# Patient Record
Sex: Male | Born: 1960 | Race: Black or African American | Hispanic: No | Marital: Single | Smoking: Never smoker
Health system: Southern US, Community
[De-identification: ages and names within clinical notes are randomized; demographics above are authoritative.]

## PROBLEM LIST (undated history)

## (undated) DIAGNOSIS — I739 Peripheral vascular disease, unspecified: Secondary | ICD-10-CM

## (undated) DIAGNOSIS — I10 Essential (primary) hypertension: Secondary | ICD-10-CM

## (undated) DIAGNOSIS — E785 Hyperlipidemia, unspecified: Secondary | ICD-10-CM

## (undated) DIAGNOSIS — T7840XA Allergy, unspecified, initial encounter: Secondary | ICD-10-CM

## (undated) DIAGNOSIS — E7800 Pure hypercholesterolemia, unspecified: Secondary | ICD-10-CM

## (undated) HISTORY — DX: Hyperlipidemia, unspecified: E78.5

## (undated) HISTORY — DX: Peripheral vascular disease, unspecified: I73.9

## (undated) HISTORY — DX: Allergy, unspecified, initial encounter: T78.40XA

---

## 2009-06-15 ENCOUNTER — Emergency Department (HOSPITAL_COMMUNITY): Admission: EM | Admit: 2009-06-15 | Discharge: 2009-06-15 | Payer: Self-pay | Admitting: Emergency Medicine

## 2010-07-05 LAB — URINALYSIS, ROUTINE W REFLEX MICROSCOPIC
Hgb urine dipstick: NEGATIVE
Ketones, ur: NEGATIVE mg/dL
Protein, ur: NEGATIVE mg/dL
Urobilinogen, UA: 0.2 mg/dL (ref 0.0–1.0)

## 2010-07-05 LAB — POCT I-STAT, CHEM 8
Calcium, Ion: 1.17 mmol/L (ref 1.12–1.32)
Creatinine, Ser: 1 mg/dL (ref 0.4–1.5)
Hemoglobin: 17.7 g/dL — ABNORMAL HIGH (ref 13.0–17.0)
Sodium: 138 mEq/L (ref 135–145)
TCO2: 31 mmol/L (ref 0–100)

## 2012-03-30 ENCOUNTER — Emergency Department (HOSPITAL_COMMUNITY)
Admission: EM | Admit: 2012-03-30 | Discharge: 2012-03-30 | Disposition: A | Discharge status: Home / Self Care | Payer: Self-pay | Attending: Emergency Medicine | Admitting: Emergency Medicine

## 2012-03-30 ENCOUNTER — Emergency Department (HOSPITAL_COMMUNITY): Payer: Self-pay

## 2012-03-30 ENCOUNTER — Encounter (HOSPITAL_COMMUNITY): Payer: Self-pay

## 2012-03-30 DIAGNOSIS — I10 Essential (primary) hypertension: Secondary | ICD-10-CM | POA: Insufficient documentation

## 2012-03-30 DIAGNOSIS — R079 Chest pain, unspecified: Secondary | ICD-10-CM | POA: Insufficient documentation

## 2012-03-30 DIAGNOSIS — Z79899 Other long term (current) drug therapy: Secondary | ICD-10-CM | POA: Insufficient documentation

## 2012-03-30 DIAGNOSIS — E7800 Pure hypercholesterolemia, unspecified: Secondary | ICD-10-CM | POA: Insufficient documentation

## 2012-03-30 HISTORY — DX: Essential (primary) hypertension: I10

## 2012-03-30 HISTORY — DX: Pure hypercholesterolemia, unspecified: E78.00

## 2012-03-30 LAB — HEPATIC FUNCTION PANEL
Bilirubin, Direct: 0.2 mg/dL (ref 0.0–0.3)
Indirect Bilirubin: 1 mg/dL — ABNORMAL HIGH (ref 0.3–0.9)
Total Bilirubin: 1.2 mg/dL (ref 0.3–1.2)

## 2012-03-30 LAB — CBC WITH DIFFERENTIAL/PLATELET
Basophils Absolute: 0 10*3/uL (ref 0.0–0.1)
Basophils Relative: 0 % (ref 0–1)
Eosinophils Absolute: 0.1 10*3/uL (ref 0.0–0.7)
Eosinophils Relative: 1 % (ref 0–5)
Lymphs Abs: 2 10*3/uL (ref 0.7–4.0)
MCH: 29.6 pg (ref 26.0–34.0)
Neutrophils Relative %: 52 % (ref 43–77)
Platelets: 221 10*3/uL (ref 150–400)
RBC: 5.64 MIL/uL (ref 4.22–5.81)
RDW: 12.8 % (ref 11.5–15.5)

## 2012-03-30 LAB — POCT I-STAT TROPONIN I
Troponin i, poc: 0 ng/mL (ref 0.00–0.08)
Troponin i, poc: 0 ng/mL (ref 0.00–0.08)

## 2012-03-30 LAB — LIPASE, BLOOD: Lipase: 26 U/L (ref 11–59)

## 2012-03-30 LAB — BASIC METABOLIC PANEL
Calcium: 10.2 mg/dL (ref 8.4–10.5)
GFR calc Af Amer: >90 mL/min (ref >90)
GFR calc non Af Amer: 81 mL/min — ABNORMAL LOW (ref >90)
Glucose, Bld: 92 mg/dL (ref 70–99)
Potassium: 3.4 mEq/L — ABNORMAL LOW (ref 3.5–5.1)
Sodium: 136 mEq/L (ref 135–145)

## 2012-03-30 MED ORDER — GI COCKTAIL ~~LOC~~
30.0000 mL | Freq: Once | ORAL | Status: AC
  Administered 2012-03-30: 30 mL via ORAL
  Filled 2012-03-30: qty 30

## 2012-03-30 MED ORDER — FAMOTIDINE 20 MG PO TABS: 20.0000 mg | ORAL_TABLET | Freq: Two times a day (BID) | ORAL | Status: DC

## 2012-03-30 MED ORDER — ASPIRIN 81 MG PO TABS: 325.0000 mg | ORAL_TABLET | Freq: Once | ORAL | Status: DC

## 2012-03-30 MED ORDER — ASPIRIN 325 MG PO TABS
325.0000 mg | ORAL_TABLET | Freq: Once | ORAL | Status: AC
  Administered 2012-03-30: 325 mg via ORAL
  Filled 2012-03-30: qty 1

## 2012-03-30 NOTE — ED Provider Notes (Signed)
House available for consultation the appropriate patient care phase in the CDU.  Please see my initial note for the patient's placement in the CDU.  Gerhard Munch, MD 03/30/12 [phone]

## 2012-03-30 NOTE — ED Notes (Signed)
Resident MD at bedside.

## 2012-03-30 NOTE — ED Notes (Signed)
Family at bedside. 

## 2012-03-30 NOTE — ED Notes (Signed)
Patient returned from X-ray 

## 2012-03-30 NOTE — ED Provider Notes (Signed)
5:56 PM Assumed care of the patient in the CDU from Dr. Jeraldine Loots.   Patient presented today with a chief complaint of chest pain.  No prior cardiac history.  No acute changes on EKG.  Initial troponin is negative.  Plan is for the patient to have repeat troponin at 8:30 PM and to discharge home if negative.   6:09 PM Reassessed patient.  Patient denies chest pain at this time.  He reports that the GI cocktail helped the pain.   Patient alert and orientated x 3, Heart RRR, Lungs CTAB, Abdomen soft and tender, no lower extremity edema, DP pulse 2 + bilaterally  9:51 PM Repeat 6 hour troponin negative.  Patient continues to deny chest pain.  Patient alert and orientated x 3, Heart RRR, Lungs CTAB, Abdomen soft and tender, no lower extremity edema, DP pulse 2 + bilaterally.  Will discharge patient home with prescription for Pepcid.  Return precautions discussed.   Pascal Lux [place], PA-C 03/30/12 2355

## 2012-03-30 NOTE — ED Notes (Signed)
Pt reports developing chest tightness and sharp pain in left and mid chest region at work this am. States that pain is with coughing and movement. Reports no chest pain in past. Does admit to being under a great amount of stress and also lifting heavy objects at work this am and earlier this week. Pt rates chest pressure/soreness 8/10 at this time. Denies nausea, vomiting, diaphoresis, dizziness, and light headedness. Does report SOB with chest pain.

## 2012-03-30 NOTE — ED Notes (Signed)
Patient is alert and orientedx4.  Patient was explained discharge instructions and he understood them.  Patient is being transported home by his wife, Hubbard Hartshorn.

## 2012-03-30 NOTE — ED Provider Notes (Signed)
History     CSN: [id number]  Arrival date & time 03/30/12  1426   None     Chief Complaint  Patient presents with  . Chest Pain   HPI chief complaint chest pain. Patient arrived by private vehicle. History provided by patient. No language barriers. Information not limited onset 4 hours ago. Location lower sternal upper epigastric area. Symptoms worsened with palpation and improved with laying still. Quality dull. No radiation. Severity mild. Timing constant. Duration several hours. Context the patient states this happened after lifting a 40 pound tray at work. For associated signs and symptoms please refer to the review of systems. No treatments prior to arrival. No recent medical care. Regarding patient's social history please refer to the nurse's notes. I have reviewed patient's past medical past surgical social history as well as medications and allergies. No family history for myocardial infarction.  Past Medical History  Diagnosis Date  . Hypercholesteremia   . Hypertension     History reviewed. No pertinent past surgical history.  No family history on file.  History  Substance Use Topics  . Smoking status: Never Smoker   . Smokeless tobacco: Not on file  . Alcohol Use: No      Review of Systems  Constitutional: Negative for fever and chills.  HENT: Negative for neck pain and neck stiffness.   Eyes: Negative for visual disturbance.  Respiratory: Negative for cough, chest tightness and shortness of breath.   Cardiovascular: Positive for chest pain. Negative for palpitations and leg swelling.  Gastrointestinal: Negative for nausea, vomiting, abdominal pain, diarrhea, constipation, blood in stool and abdominal distention.  Genitourinary: Negative for dysuria, urgency, hematuria and difficulty urinating.  Musculoskeletal: Negative for back pain and gait problem.  Skin: Negative for rash.  Neurological: Negative for dizziness, tremors, seizures, syncope, facial asymmetry,  speech difficulty, weakness, light-headedness, numbness and headaches.  Hematological: Negative for adenopathy. Does not bruise/bleed easily.  Psychiatric/Behavioral: Negative for confusion.    Allergies  Review of patient's allergies indicates no known allergies.  Home Medications   Current Outpatient Rx  Name  Route  Sig  Dispense  Refill  . IBUPROFEN 200 MG PO TABS   Oral   Take 200 mg by mouth every 6 (six) hours as needed. For pain         . LORATADINE 10 MG PO TABS   Oral   Take 10 mg by mouth daily.         . ADULT MULTIVITAMIN W/MINERALS CH   Oral   Take 1 tablet by mouth daily.         Marland Kitchen FISH OIL PO   Oral   Take 1 capsule by mouth daily.         Marland Kitchen SIMVASTATIN 20 MG PO TABS   Oral   Take 20 mg by mouth every evening.         . TRIAMTERENE-HCTZ 37.5-25 MG PO CAPS   Oral   Take 1 capsule by mouth every morning.           BP 132/93  Pulse 86  Temp 98.3 F (36.8 C) (Oral)  Resp 20  Ht 5\' 11"  (1.803 m)  Wt 210 lb (95.255 kg)  BMI 29.29 kg/m2  SpO2 97%  Physical Exam  Constitutional: He is oriented to person, place, and time. He appears well-developed and well-nourished. No distress.  HENT:  Head: Normocephalic.  Eyes: Conjunctivae normal are normal.  Neck: Normal range of motion. Neck supple.  Cardiovascular: Normal  rate, regular rhythm, normal heart sounds and intact distal pulses.   No murmur heard. Pulmonary/Chest: Effort normal and breath sounds normal. No respiratory distress. He has no wheezes. He has no rales. He exhibits tenderness.  Abdominal: Soft. Bowel sounds are normal. He exhibits no distension and no mass. There is tenderness. There is no rebound and no guarding.  Musculoskeletal: Normal range of motion. He exhibits no edema and no tenderness.  Neurological: He is alert and oriented to person, place, and time.  Skin: Skin is warm and dry. He is not diaphoretic.  Psychiatric: He has a normal mood and affect.    ED Course   Procedures (including critical care time)  Labs Reviewed  BASIC METABOLIC PANEL - Abnormal; Notable for the following:    Potassium 3.4 (*)     GFR calc non Af Amer 81 (*)     All other components within normal limits  CBC WITH DIFFERENTIAL  POCT I-STAT TROPONIN I   Dg Chest 2 View  03/30/2012  *RADIOLOGY REPORT*  Clinical Data: Chest pain, cough, history of hypertension  CHEST - 2 VIEW  Comparison: None.  Findings: Normal heart size, mediastinal contours, and pulmonary vascularity. Lungs clear. No pleural effusion or pneumothorax. Bones unremarkable.  IMPRESSION: No acute abnormalities.   Original Report Authenticated By: Ulyses Southward, M.D.      1. Chest pain       MDM  Patient is a very well-appearing 51 year old male with past history of hypertension and hyperlipidemia with a TIMI score of 0 presents with lower sternal pain starting after lifting a 40 pound tray at work. No radiation no pleuritic component to this pain. Extreme reproducible with mild palpation over his lower sternum and upper abdomen. No other concerning exam findings. PERC negative. PE very doubtful. GCS also doubtful given story is much more consistent with a musculoskeletal cause of his chest pain. Initial troponin negative. Plan for a delta troponin 8 hours after symptom onset which will be obtained at 8:30 PM. If this is negative the patient can go home with PCP followup. Transferred to CDU while waiting delta troponin. Patient remained asymptomatic in the ED. Aspirin provided. EKG unremarkable.        Consuello Masse, MD 03/31/12 [phone]

## 2012-03-30 NOTE — ED Notes (Signed)
Pt presents with sudden onset of mid-sternal chest pain while at work today at 1100.  Pt reports pain has been constant and does not radiate.  +shortness of breath.  Pt denies any nausea, reports diaphoresis during onset.

## 2012-04-02 NOTE — ED Provider Notes (Signed)
  I performed a history and physical examination of Jerry Crawford and discussed his management with Dr. March Rummage.  I agree with the history, physical, assessment, and plan of care, with the following exceptions: None  Patient w cp, in nad on my exam.    Thelonious Kauffmann, Elvis Coil, MD 04/02/12 [phone]

## 2012-08-15 ENCOUNTER — Emergency Department (HOSPITAL_COMMUNITY)
Admission: EM | Admit: 2012-08-15 | Discharge: 2012-08-15 | Disposition: A | Discharge status: Home / Self Care | Payer: No Typology Code available for payment source | Attending: Emergency Medicine | Admitting: Emergency Medicine

## 2012-08-15 ENCOUNTER — Encounter (HOSPITAL_COMMUNITY): Payer: Self-pay | Admitting: Emergency Medicine

## 2012-08-15 DIAGNOSIS — Y92410 Unspecified street and highway as the place of occurrence of the external cause: Secondary | ICD-10-CM | POA: Insufficient documentation

## 2012-08-15 DIAGNOSIS — Y9389 Activity, other specified: Secondary | ICD-10-CM | POA: Insufficient documentation

## 2012-08-15 DIAGNOSIS — IMO0002 Reserved for concepts with insufficient information to code with codable children: Secondary | ICD-10-CM | POA: Insufficient documentation

## 2012-08-15 DIAGNOSIS — I10 Essential (primary) hypertension: Secondary | ICD-10-CM | POA: Insufficient documentation

## 2012-08-15 DIAGNOSIS — Z79899 Other long term (current) drug therapy: Secondary | ICD-10-CM | POA: Insufficient documentation

## 2012-08-15 DIAGNOSIS — M546 Pain in thoracic spine: Secondary | ICD-10-CM

## 2012-08-15 DIAGNOSIS — S161XXA Strain of muscle, fascia and tendon at neck level, initial encounter: Secondary | ICD-10-CM

## 2012-08-15 DIAGNOSIS — S139XXA Sprain of joints and ligaments of unspecified parts of neck, initial encounter: Secondary | ICD-10-CM | POA: Insufficient documentation

## 2012-08-15 DIAGNOSIS — E7800 Pure hypercholesterolemia, unspecified: Secondary | ICD-10-CM | POA: Insufficient documentation

## 2012-08-15 MED ORDER — IBUPROFEN 800 MG PO TABS
800.0000 mg | ORAL_TABLET | Freq: Once | ORAL | Status: AC
  Administered 2012-08-15: 800 mg via ORAL
  Filled 2012-08-15: qty 1

## 2012-08-15 MED ORDER — CYCLOBENZAPRINE HCL 10 MG PO TABS
10.0000 mg | ORAL_TABLET | Freq: Once | ORAL | Status: AC
  Administered 2012-08-15: 10 mg via ORAL
  Filled 2012-08-15: qty 1

## 2012-08-15 MED ORDER — IBUPROFEN 800 MG PO TABS: 800.0000 mg | ORAL_TABLET | Freq: Three times a day (TID) | ORAL | Status: DC

## 2012-08-15 MED ORDER — CYCLOBENZAPRINE HCL 10 MG PO TABS: 10.0000 mg | ORAL_TABLET | Freq: Two times a day (BID) | ORAL | Status: DC | PRN
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
## 2012-08-21 NOTE — ED Provider Notes (Signed)
Medical screening examination/treatment/procedure(s) were performed by non-physician practitioner and as supervising physician I was immediately available for consultation/collaboration.  Raeford Razor, MD 08/21/12 [phone]

## 2015-08-10 LAB — CBC AND DIFFERENTIAL
HCT: 48 % (ref 41–53)
HEMOGLOBIN: 16.6 g/dL (ref 13.5–17.5)
Neutrophils Absolute: 36 /uL
Platelets: 236 10*3/uL (ref 150–399)
WBC: 4.2 10*3/mL

## 2015-08-10 LAB — LIPID PANEL
CHOLESTEROL: 130 mg/dL (ref 0–200)
HDL: 42 mg/dL (ref 35–70)
LDL Cholesterol: 71 mg/dL
TRIGLYCERIDES: 84 mg/dL (ref 40–160)

## 2015-08-10 LAB — BASIC METABOLIC PANEL
BUN: 17 mg/dL (ref 4–21)
Creatinine: 1.1 mg/dL (ref <=1.3)
Glucose: 131 mg/dL
POTASSIUM: 4.2 mmol/L (ref 3.4–5.3)
Sodium: 141 mmol/L (ref 137–147)

## 2015-08-10 LAB — HEPATIC FUNCTION PANEL
ALT: 25 U/L (ref 10–40)
AST: 24 U/L (ref 14–40)
Alkaline Phosphatase: 59 U/L (ref 25–125)
Bilirubin, Total: 1.2 mg/dL

## 2016-06-15 ENCOUNTER — Encounter: Payer: Self-pay | Admitting: Family

## 2016-06-15 ENCOUNTER — Other Ambulatory Visit (INDEPENDENT_AMBULATORY_CARE_PROVIDER_SITE_OTHER): Payer: Managed Care, Other (non HMO)

## 2016-06-15 ENCOUNTER — Ambulatory Visit (INDEPENDENT_AMBULATORY_CARE_PROVIDER_SITE_OTHER): Payer: Managed Care, Other (non HMO) | Admitting: Family

## 2016-06-15 VITALS — BP 126/82 | HR 80 | Temp 98.7°F | Resp 16 | Ht 71.0 in | Wt 208.9 lb

## 2016-06-15 DIAGNOSIS — R7301 Impaired fasting glucose: Secondary | ICD-10-CM

## 2016-06-15 DIAGNOSIS — M67432 Ganglion, left wrist: Secondary | ICD-10-CM | POA: Diagnosis not present

## 2016-06-15 DIAGNOSIS — R7989 Other specified abnormal findings of blood chemistry: Secondary | ICD-10-CM

## 2016-06-15 DIAGNOSIS — E291 Testicular hypofunction: Secondary | ICD-10-CM

## 2016-06-15 DIAGNOSIS — Z7289 Other problems related to lifestyle: Secondary | ICD-10-CM

## 2016-06-15 DIAGNOSIS — I83893 Varicose veins of bilateral lower extremities with other complications: Secondary | ICD-10-CM | POA: Insufficient documentation

## 2016-06-15 DIAGNOSIS — Z0001 Encounter for general adult medical examination with abnormal findings: Secondary | ICD-10-CM

## 2016-06-15 DIAGNOSIS — N521 Erectile dysfunction due to diseases classified elsewhere: Secondary | ICD-10-CM

## 2016-06-15 DIAGNOSIS — I8393 Asymptomatic varicose veins of bilateral lower extremities: Secondary | ICD-10-CM | POA: Diagnosis not present

## 2016-06-15 LAB — COMPREHENSIVE METABOLIC PANEL
ALK PHOS: 52 U/L (ref 39–117)
ALT: 22 U/L (ref 0–53)
AST: 21 U/L (ref 0–37)
Albumin: 4.8 g/dL (ref 3.5–5.2)
BILIRUBIN TOTAL: 1.3 mg/dL — AB (ref 0.2–1.2)
BUN: 15 mg/dL (ref 6–23)
CO2: 31 meq/L (ref 19–32)
Calcium: 10.1 mg/dL (ref 8.4–10.5)
Chloride: 99 mEq/L (ref 96–112)
Creatinine, Ser: 1 mg/dL (ref 0.40–1.50)
GFR: 82.88 mL/min (ref >60.00)
GLUCOSE: 105 mg/dL — AB (ref 70–99)
Potassium: 3.4 mEq/L — ABNORMAL LOW (ref 3.5–5.1)
Sodium: 137 mEq/L (ref 135–145)
TOTAL PROTEIN: 7.5 g/dL (ref 6.0–8.3)

## 2016-06-15 LAB — TSH: TSH: 1.26 u[IU]/mL (ref 0.35–4.50)

## 2016-06-15 LAB — LIPID PANEL
CHOL/HDL RATIO: 3
Cholesterol: 113 mg/dL (ref 0–200)
HDL: 41.8 mg/dL (ref >39.00)
LDL Cholesterol: 59 mg/dL (ref 0–99)
NONHDL: 70.92
Triglycerides: 61 mg/dL (ref 0.0–149.0)
VLDL: 12.2 mg/dL (ref 0.0–40.0)

## 2016-06-15 LAB — CBC
HCT: 47 % (ref 39.0–52.0)
Hemoglobin: 16.3 g/dL (ref 13.0–17.0)
MCHC: 34.6 g/dL (ref 30.0–36.0)
MCV: 87.3 fl (ref 78.0–100.0)
Platelets: 232 10*3/uL (ref 150.0–400.0)
RBC: 5.39 Mil/uL (ref 4.22–5.81)
RDW: 13.4 % (ref 11.5–15.5)
WBC: 3.8 10*3/uL — AB (ref 4.0–10.5)

## 2016-06-15 LAB — TESTOSTERONE: TESTOSTERONE: 333.93 ng/dL (ref 300.00–890.00)

## 2016-06-15 LAB — HEMOGLOBIN A1C: HEMOGLOBIN A1C: 6.3 % (ref 4.6–6.5)

## 2016-06-15 LAB — PSA: PSA: 0.71 ng/mL (ref 0.10–4.00)

## 2016-06-15 MED ORDER — SILDENAFIL CITRATE 100 MG PO TABS: 50.0000 mg | ORAL_TABLET | Freq: Every day | ORAL | 0 refills | Status: DC | PRN

## 2016-06-15 NOTE — Assessment & Plan Note (Signed)
1) Anticipatory Guidance: Discussed importance of wearing a seatbelt while driving and not texting while driving; changing batteries in smoke detector at least once annually; wearing suntan lotion when outside; eating a balanced and moderate diet; getting physical activity at least 30 minutes per day.  2) Immunizations / Screenings / Labs:  Declines tetanus and influenza. All other immunizations are up-to-date per recommendations. Obtain hepatitis C antibody for hepatitis C screening. Obtain PSA for prostate cancer screening. Obtain hemoglobin A1c for diabetes screening. Obtain PSA for prostate cancer screening. Due for dental exam encouraged to be completed independently. All other screenings are up-to-date per recommendations. Obtain CBC, TSH, CMET, and lipid profile.    Overall well exam with risk factors for cardiovascular disease including hypertension, overweight and hyperlipidemia. Currently maintained on medication with status to be determined by blood work. Continue other healthy lifestyle behaviors and choices. Follow-up prevention exam in 1 year. Follow-up office visit pending blood work as indicated.

## 2016-06-15 NOTE — Assessment & Plan Note (Signed)
Erectile dysfunction most likely associated with chronic medical conditions and multifactorial in nature. Discussed importance of controlling chronic conditions with medication and lifestyle. Noted medication may also be a contributor being factor. Start Viagra. Proper medication usage and side effects discussed including seek emergency care for a priapism. Follow-up pending trial of medication.

## 2016-06-15 NOTE — Progress Notes (Signed)
Subjective:    Patient ID: Jerry Crawford, male    DOB: 1960/05/23, 56 y.o.   MRN: [id number]  Chief Complaint  Patient presents with  . Establish Care    CPE, fasting    HPI:  Jerry Crawford is a 56 y.o. male who presents today for an annual wellness visit.   1) Health Maintenance -   Diet - Averaging about 1-2 meals per day consisting of a regular diet; Caffeine intake of 2-3 cups daily  Exercise - Job is very physical always active at work; no structured exercise out of work   2) Publishing rights manager / Immunizations:  Dental -- Due for exam  Vision -- Up to date    Health Maintenance  Topic Date Due  . Hepatitis C Screening  04[birth year]  . HIV Screening  11/01/1977  . TETANUS/TDAP  11/01/1981  . COLONOSCOPY  11/01/2012  . INFLUENZA VACCINE  07/09/2016 (Originally 11/10/2015)     There is no immunization history on file for this patient.  3.) Ganglion Cyst - This is a new problem. Associated symptom of a cyst located on his left wrist has been going on for several months. Described as enlarging at times with no significant tenderness. Denies any trauma or injury. No numbness or tingling. Denies any modifying factors or attempted treatments.  4.) Varicose veins - this is a new problem. Associated symptoms of varicose veins located in his bilateral lower extremities hemming going on for several years and are painful at times. Denies cramping or calf pain. Denies any modifying factors or attempted treatments. Overall symptoms appear to be stable and gradually worsening over time. Would like referral to vascular surgery for follow-up.  5.) Low testosterone/erectile dysfunction - this is a new problem. Associated symptom of decreased erectile strength with ability to obtain an erection has been going on for several months. Denies any modifying factors or attempted treatments. No trauma or injury. No urinary symptoms. Previously diagnosed with low testosterone with  blood work showing a testosterone level of 348. Does have fatigue on occasions.  No Known Allergies   No outpatient prescriptions prior to visit.   No facility-administered medications prior to visit.      Past Medical History:  Diagnosis Date  . Hyperlipidemia   . Hypertension      History reviewed. No pertinent surgical history.   Family History  Problem Relation Age of Onset  . Asthma Mother   . Asthma Sister   . Asthma Brother   . Healthy Father      Social History   Social History  . Marital status: Single    Spouse name: N/A  . Number of children: 4  . Years of education: 12   Occupational History  . Glass blower/designer    Social History Main Topics  . Smoking status: Never Smoker  . Smokeless tobacco: Never Used  . Alcohol use Yes     Comment: Rarely <1/month  . Drug use: No  . Sexual activity: Not on file   Other Topics Concern  . Not on file   Social History Narrative   Fun/Hobby: Dancing, basketball, baseball, going out every once in a while       Review of Systems  Constitutional: Denies fever, chills, fatigue, or significant weight gain/loss. HENT: Head: Denies headache or neck pain Ears: Denies changes in hearing, ringing in ears, earache, drainage Nose: Denies discharge, stuffiness, itching, nosebleed, sinus pain Throat: Denies sore throat, hoarseness, dry mouth, sores, thrush Eyes: Denies loss/changes  in vision, pain, redness, blurry/double vision, flashing lights Cardiovascular: Denies chest pain/discomfort, tightness, palpitations, shortness of breath with activity, difficulty lying down, swelling, sudden awakening with shortness of breath Positive for varicose veins Respiratory: Denies shortness of breath, cough, sputum production, wheezing Gastrointestinal: Denies dysphasia, heartburn, change in appetite, nausea, change in bowel habits, rectal bleeding, constipation, diarrhea, yellow skin or eyes Genitourinary: Denies frequency,  urgency, burning/pain, blood in urine, incontinence, change in urinary strength. Positive for erectile dysfunction.  Musculoskeletal: Denies muscle/joint pain, stiffness, back pain, redness or swelling of joints, trauma Positive for cyst of wrist.  Skin: Denies rashes, lumps, itching, dryness, color changes, or hair/nail changes Neurological: Denies dizziness, fainting, seizures, weakness, numbness, tingling, tremor Psychiatric - Denies nervousness, stress, depression or memory loss Endocrine: Denies heat or cold intolerance, sweating, frequent urination, excessive thirst, changes in appetite Hematologic: Denies ease of bruising or bleeding     Objective:     BP 126/82 (BP Location: Left Arm, Patient Position: Sitting, Cuff Size: Large)   Pulse 80   Temp 98.7 F (37.1 C) (Oral)   Resp 16   Ht 5\' 11"  (1.803 m)   Wt 208 lb 14.4 oz (94.8 kg)   SpO2 98%   BMI 29.14 kg/m  Nursing note and vital signs reviewed.  Physical Exam  Constitutional: He is oriented to person, place, and time. He appears well-developed and well-nourished.  HENT:  Head: Normocephalic.  Right Ear: Hearing, tympanic membrane, external ear and ear canal normal.  Left Ear: Hearing, tympanic membrane, external ear and ear canal normal.  Nose: Nose normal.  Mouth/Throat: Uvula is midline, oropharynx is clear and moist and mucous membranes are normal.  Eyes: Conjunctivae and EOM are normal. Pupils are equal, round, and reactive to light.  Neck: Neck supple. No JVD present. No tracheal deviation present. No thyromegaly present.  Cardiovascular: Normal rate, regular rhythm, normal heart sounds and intact distal pulses.   Distal pulses and sensation are intact and appropriate. Multiple varicose veins noted on bilateral lower extremities which are mildly sensitive to the touch. No evidence of infection.  Pulmonary/Chest: Effort normal and breath sounds normal.  Abdominal: Soft. Bowel sounds are normal. He exhibits no  distension and no mass. There is no tenderness. There is no rebound and no guarding.  Musculoskeletal: Normal range of motion. He exhibits no edema or tenderness.  Left wrist - obvious cyst located on the lateral aspect below the anatomical snuffbox. Appears close to the radial artery. Firm to the touch with no tenderness. Cyst is mobile.  Lymphadenopathy:    He has no cervical adenopathy.  Neurological: He is alert and oriented to person, place, and time. He has normal reflexes. No cranial nerve deficit. He exhibits normal muscle tone. Coordination normal.  Skin: Skin is warm and dry.  Psychiatric: He has a normal mood and affect. His behavior is normal. Judgment and thought content normal.       Assessment & Plan:   Problem List Items Addressed This Visit      Cardiovascular and Mediastinum   Varicose veins of both lower extremities    Symptoms and exam consistent with varicose veins located on bilateral lower extremities with mild tenderness to touch on occasion. Refer to vascular surgery per patient request.       Relevant Medications   triamterene-hydrochlorothiazide (DYAZIDE) 37.5-25 MG capsule   simvastatin (ZOCOR) 20 MG tablet   amLODipine (NORVASC) 10 MG tablet   sildenafil (VIAGRA) 100 MG tablet   Other Relevant Orders  Ambulatory referral to Vascular Surgery     Endocrine   Elevated fasting blood sugar    Previously noted to have elevated blood sugars. Does note occasional urinary frequency and increased thirst. Obtain hemoglobin A1c. Continue to monitor pending A1c results.      Relevant Orders   Hemoglobin A1c (Completed)     Musculoskeletal and Integument   Ganglion cyst of wrist, left    Symptoms and exam consistent with ganglion cyst located on left wrist with concern for placement near the radial artery. Refer to orthopedics for drainage and possible removal. Continue to monitor pending referral.      Relevant Orders   Ambulatory referral to Orthopedic  Surgery     Other   Encounter for general adult medical examination with abnormal findings - Primary    1) Anticipatory Guidance: Discussed importance of wearing a seatbelt while driving and not texting while driving; changing batteries in smoke detector at least once annually; wearing suntan lotion when outside; eating a balanced and moderate diet; getting physical activity at least 30 minutes per day.  2) Immunizations / Screenings / Labs:  Declines tetanus and influenza. All other immunizations are up-to-date per recommendations. Obtain hepatitis C antibody for hepatitis C screening. Obtain PSA for prostate cancer screening. Obtain hemoglobin A1c for diabetes screening. Obtain PSA for prostate cancer screening. Due for dental exam encouraged to be completed independently. All other screenings are up-to-date per recommendations. Obtain CBC, TSH, CMET, and lipid profile.    Overall well exam with risk factors for cardiovascular disease including hypertension, overweight and hyperlipidemia. Currently maintained on medication with status to be determined by blood work. Continue other healthy lifestyle behaviors and choices. Follow-up prevention exam in 1 year. Follow-up office visit pending blood work as indicated.      Relevant Orders   CBC (Completed)   Comprehensive metabolic panel (Completed)   Lipid panel (Completed)   PSA (Completed)   TSH (Completed)   Ambulatory referral to Gastroenterology   Decreased testosterone level    Previously noted to have decreased testosterone. Not currently maintained on medication. Obtain testosterone and CBC. Continue to monitor pending blood work results.      Relevant Orders   Testosterone (Completed)   Erectile dysfunction due to diseases classified elsewhere    Erectile dysfunction most likely associated with chronic medical conditions and multifactorial in nature. Discussed importance of controlling chronic conditions with medication and  lifestyle. Noted medication may also be a contributor being factor. Start Viagra. Proper medication usage and side effects discussed including seek emergency care for a priapism. Follow-up pending trial of medication.       Other Visit Diagnoses    Other problems related to lifestyle       Relevant Orders   Hepatitis C antibody       I am having Mr. Woldt start on sildenafil. I am also having him maintain his triamterene-hydrochlorothiazide, simvastatin, and amLODipine.   Meds ordered this encounter  Medications  . triamterene-hydrochlorothiazide (DYAZIDE) 37.5-25 MG capsule    Sig: Take 1 capsule by mouth daily.  . simvastatin (ZOCOR) 20 MG tablet    Sig: Take 20 mg by mouth daily.  Marland Kitchen amLODipine (NORVASC) 10 MG tablet    Sig: Take 10 mg by mouth daily.  . sildenafil (VIAGRA) 100 MG tablet    Sig: Take 0.5-1 tablets (50-100 mg total) by mouth daily as needed for erectile dysfunction.    Dispense:  6 tablet    Refill:  0  Order Specific Question:   Supervising Provider    Answer:   Pricilla Holm A [[id number]]     Follow-up: Return in about 1 month (around 07/16/2016), or if symptoms worsen or fail to improve.   Jerry Po, FNP

## 2016-06-15 NOTE — Assessment & Plan Note (Signed)
Symptoms and exam consistent with varicose veins located on bilateral lower extremities with mild tenderness to touch on occasion. Refer to vascular surgery per patient request.

## 2016-06-15 NOTE — Assessment & Plan Note (Signed)
Previously noted to have decreased testosterone. Not currently maintained on medication. Obtain testosterone and CBC. Continue to monitor pending blood work results.

## 2016-06-15 NOTE — Assessment & Plan Note (Signed)
Symptoms and exam consistent with ganglion cyst located on left wrist with concern for placement near the radial artery. Refer to orthopedics for drainage and possible removal. Continue to monitor pending referral.

## 2016-06-15 NOTE — Patient Instructions (Addendum)
Thank you for choosing [facility].  SUMMARY AND INSTRUCTIONS:  Please continue to take your medications as prescribed.  Start taking the Viagra as needed about 30 minutes before.   You will receive a call to schedule your colonoscopy, and from vascular surgery and orthopedics.  Follow up in 1 month.  Continue to work on nutrition and physical activity.   Medication:  Your prescription(s) have been submitted to your pharmacy or been printed and provided for you. Please take as directed and contact our office if you believe you are having problem(s) with the medication(s) or have any questions.  Labs:  Please stop by the lab on the lower level of the building for your blood work. Your results will be released to [place] (or called to you) after review, usually within 72 hours after test completion. If any changes need to be made, you will be notified at that same time.  1.) The lab is open from 7:30am to 5:30 pm Monday-Friday 2.) No appointment is necessary 3.) Fasting (if needed) is 6-8 hours after food and drink; black coffee and water are okay   Referrals:  Referrals have been made during this visit. You should expect to hear back from our schedulers in about 7-10 days in regards to establishing an appointment with the specialists we discussed.   Follow up:  If your symptoms worsen or fail to improve, please contact our office for further instruction, or in case of emergency go directly to the emergency room at the closest medical facility.    Health Maintenance, Male A healthy lifestyle and preventive care is important for your health and wellness. Ask your health care provider about what schedule of regular examinations is right for you. What should I know about weight and diet?  Eat a Healthy Diet  Eat plenty of vegetables, fruits, whole grains, low-fat dairy products, and lean protein.  Do not eat a lot of foods high in solid fats, added sugars, or  salt. Maintain a Healthy Weight  Regular exercise can help you achieve or maintain a healthy weight. You should:  Do at least 150 minutes of exercise each week. The exercise should increase your heart rate and make you sweat (moderate-intensity exercise).  Do strength-training exercises at least twice a week. Watch Your Levels of Cholesterol and Blood Lipids  Have your blood tested for lipids and cholesterol every 5 years starting at 54 years of age. If you are at high risk for heart disease, you should start having your blood tested when you are 54 years old. You may need to have your cholesterol levels checked more often if:  Your lipid or cholesterol levels are high.  You are older than 54 years of age.  You are at high risk for heart disease. What should I know about cancer screening? Many types of cancers can be detected early and may often be prevented. Lung Cancer  You should be screened every year for lung cancer if:  You are a current smoker who has smoked for at least 30 years.  You are a former smoker who has quit within the past 15 years.  Talk to your health care provider about your screening options, when you should start screening, and how often you should be screened. Colorectal Cancer  Routine colorectal cancer screening usually begins at 54 years of age and should be repeated every 5-10 years until you are 54 years old. You may need to be screened more often if early forms of precancerous  polyps or small growths are found. Your health care provider may recommend screening at an earlier age if you have risk factors for colon cancer.  Your health care provider may recommend using home test kits to check for hidden blood in the stool.  A small camera at the end of a tube can be used to examine your colon (sigmoidoscopy or colonoscopy). This checks for the earliest forms of colorectal cancer. Prostate and Testicular Cancer  Depending on your age and overall health,  your health care provider may do certain tests to screen for prostate and testicular cancer.  Talk to your health care provider about any symptoms or concerns you have about testicular or prostate cancer. Skin Cancer  Check your skin from head to toe regularly.  Tell your health care provider about any new moles or changes in moles, especially if:  There is a change in a mole's size, shape, or color.  You have a mole that is larger than a pencil eraser.  Always use sunscreen. Apply sunscreen liberally and repeat throughout the day.  Protect yourself by wearing long sleeves, pants, a wide-brimmed hat, and sunglasses when outside. What should I know about heart disease, diabetes, and high blood pressure?  If you are 69-17 years of age, have your blood pressure checked every 3-5 years. If you are 50 years of age or older, have your blood pressure checked every year. You should have your blood pressure measured twice-once when you are at a hospital or clinic, and once when you are not at a hospital or clinic. Record the average of the two measurements. To check your blood pressure when you are not at a hospital or clinic, you can use:  An automated blood pressure machine at a pharmacy.  A home blood pressure monitor.  Talk to your health care provider about your target blood pressure.  If you are between 4-22 years old, ask your health care provider if you should take aspirin to prevent heart disease.  Have regular diabetes screenings by checking your fasting blood sugar level.  If you are at a normal weight and have a low risk for diabetes, have this test once every three years after the age of 67.  If you are overweight and have a high risk for diabetes, consider being tested at a younger age or more often.  A one-time screening for abdominal aortic aneurysm (AAA) by ultrasound is recommended for men aged 14-75 years who are current or former smokers. What should I know about  preventing infection? Hepatitis B  If you have a higher risk for hepatitis B, you should be screened for this virus. Talk with your health care provider to find out if you are at risk for hepatitis B infection. Hepatitis C  Blood testing is recommended for:  Everyone born from [birth year] through 1963.  Anyone with known risk factors for hepatitis C. Sexually Transmitted Diseases (STDs)  You should be screened each year for STDs including gonorrhea and chlamydia if:  You are sexually active and are younger than 54 years of age.  You are older than 54 years of age and your health care provider tells you that you are at risk for this type of infection.  Your sexual activity has changed since you were last screened and you are at an increased risk for chlamydia or gonorrhea. Ask your health care provider if you are at risk.  Talk with your health care provider about whether you are at high risk of  being infected with HIV. Your health care provider may recommend a prescription medicine to help prevent HIV infection. What else can I do?  Schedule regular health, dental, and eye exams.  Stay current with your vaccines (immunizations).  Do not use any tobacco products, such as cigarettes, chewing tobacco, and e-cigarettes. If you need help quitting, ask your health care provider.  Limit alcohol intake to no more than 2 drinks per day. One drink equals 12 ounces of beer, 5 ounces of wine, or 1 ounces of hard liquor.  Do not use street drugs.  Do not share needles.  Ask your health care provider for help if you need support or information about quitting drugs.  Tell your health care provider if you often feel depressed.  Tell your health care provider if you have ever been abused or do not feel safe at home. This information is not intended to replace advice given to you by your health care provider. Make sure you discuss any questions you have with your health care provider. Document  Released: 09/24/2007 Document Revised: 11/25/2015 Document Reviewed: 12/30/2014 Elsevier Interactive Patient Education  2017 [employer].

## 2016-06-15 NOTE — Assessment & Plan Note (Signed)
Previously noted to have elevated blood sugars. Does note occasional urinary frequency and increased thirst. Obtain hemoglobin A1c. Continue to monitor pending A1c results.

## 2016-06-16 ENCOUNTER — Other Ambulatory Visit: Payer: Self-pay

## 2016-06-16 DIAGNOSIS — I8393 Asymptomatic varicose veins of bilateral lower extremities: Secondary | ICD-10-CM

## 2016-06-16 LAB — HEPATITIS C ANTIBODY: HCV Ab: NEGATIVE

## 2016-07-01 ENCOUNTER — Ambulatory Visit (INDEPENDENT_AMBULATORY_CARE_PROVIDER_SITE_OTHER): Payer: Self-pay | Admitting: Orthopaedic Surgery

## 2016-07-14 ENCOUNTER — Encounter: Payer: Self-pay | Admitting: Vascular Surgery

## 2016-07-15 ENCOUNTER — Ambulatory Visit: Payer: Managed Care, Other (non HMO) | Admitting: Family

## 2016-07-20 ENCOUNTER — Encounter: Payer: Self-pay | Admitting: Family

## 2016-07-20 ENCOUNTER — Encounter (HOSPITAL_COMMUNITY): Payer: Self-pay | Admitting: Emergency Medicine

## 2016-07-22 ENCOUNTER — Ambulatory Visit: Payer: Managed Care, Other (non HMO) | Admitting: Family

## 2016-07-22 ENCOUNTER — Ambulatory Visit (INDEPENDENT_AMBULATORY_CARE_PROVIDER_SITE_OTHER): Payer: Self-pay | Admitting: Orthopaedic Surgery

## 2016-07-26 ENCOUNTER — Ambulatory Visit (INDEPENDENT_AMBULATORY_CARE_PROVIDER_SITE_OTHER): Payer: Managed Care, Other (non HMO) | Admitting: Vascular Surgery

## 2016-07-26 ENCOUNTER — Encounter: Payer: Self-pay | Admitting: Vascular Surgery

## 2016-07-26 ENCOUNTER — Ambulatory Visit (HOSPITAL_COMMUNITY)
Admission: RE | Admit: 2016-07-26 | Discharge: 2016-07-26 | Disposition: A | Discharge status: Home / Self Care | Payer: Managed Care, Other (non HMO) | Source: Ambulatory Visit | Attending: Vascular Surgery | Admitting: Vascular Surgery

## 2016-07-26 VITALS — BP 125/86 | HR 101 | Temp 98.0°F | Resp 16 | Ht 71.0 in | Wt 204.0 lb

## 2016-07-26 DIAGNOSIS — I83893 Varicose veins of bilateral lower extremities with other complications: Secondary | ICD-10-CM

## 2016-07-26 DIAGNOSIS — I868 Varicose veins of other specified sites: Secondary | ICD-10-CM

## 2016-07-26 DIAGNOSIS — I8393 Asymptomatic varicose veins of bilateral lower extremities: Secondary | ICD-10-CM | POA: Diagnosis not present

## 2016-07-26 DIAGNOSIS — I872 Venous insufficiency (chronic) (peripheral): Secondary | ICD-10-CM | POA: Diagnosis not present

## 2016-07-26 NOTE — Progress Notes (Signed)
Referred by:  [address], [place], [place] [address]  Reason for referral: B leg varicosities   History of Present Illness   Jerry Crawford is a 56 y.o. (1960-06-01) male who presents with chief complaint: enlarging varicose veins.  Patient notes years of varicose veins that have been slowly enlarging.  He associates this with prolong standing necessary for work.  The patient's symptoms include: swelling both legs during the day.  The patient has had no history of DVT,  history of varicose vein, no history of venous stasis ulcers, no history of  Lymphedema and known history of skin changes in lower legs.  There is no family history of venous disorders.  The patient has used compression stockings in the past.   Past Medical History:  Diagnosis Date  . Hypercholesteremia   . Hyperlipidemia   . Hypertension    Past Surgical History: none  Social History   Social History  . Marital status: Single    Spouse name: N/A  . Number of children: 4  . Years of education: 12   Occupational History  . Glass blower/designer    Social History Main Topics  . Smoking status: Never Smoker  . Smokeless tobacco: Never Used  . Alcohol use Yes     Comment: Rarely <1/month  . Drug use: No  . Sexual activity: Not on file   Other Topics Concern  . Not on file   Social History Narrative   ** Merged History Encounter **       Fun/Hobby: Dancing, basketball, baseball, going out every once in a while     Family History  Problem Relation Age of Onset  . Asthma Mother   . Asthma Sister   . Asthma Brother   . Healthy Father   . Hypertension Mother   . Hypertension Sister     Current Outpatient Prescriptions  Medication Sig Dispense Refill  . amLODipine (NORVASC) 10 MG tablet Take 10 mg by mouth daily.    . cyclobenzaprine (FLEXERIL) 10 MG tablet Take 1 tablet (10 mg total) by mouth 2 (two) times daily as needed for muscle spasms. 20 tablet 0  . ibuprofen  (ADVIL,MOTRIN) 800 MG tablet Take 1 tablet (800 mg total) by mouth 3 (three) times daily. 21 tablet 0  . loratadine (CLARITIN) 10 MG tablet Take 10 mg by mouth daily.    . Multiple Vitamin (MULTIVITAMIN WITH MINERALS) TABS Take 1 tablet by mouth daily.    . Omega-3 Fatty Acids (FISH OIL PO) Take 1 capsule by mouth daily.    . sildenafil (VIAGRA) 100 MG tablet Take 0.5-1 tablets (50-100 mg total) by mouth daily as needed for erectile dysfunction. 6 tablet 0  . simvastatin (ZOCOR) 20 MG tablet Take 20 mg by mouth every evening.    . Tetrahydrozoline HCl (EYE DROPS OP) Apply 2 drops to eye as needed (for redness in eyes).    . triamterene-hydrochlorothiazide (DYAZIDE) 37.5-25 MG capsule Take 1 capsule by mouth daily.     No current facility-administered medications for this visit.     No Known Allergies   REVIEW OF SYSTEMS:   Cardiac:  positive for: no symptoms, negative for: Chest pain or chest pressure, Shortness of breath upon exertion and Shortness of breath when lying flat,   Vascular:  positive for: Pain in calf, thigh, or hip brought on by ambulation, leg swelling negative for: Pain in feet at night that wakes you up from your sleep  and Blood clot in your veins  Pulmonary:  positive for: no symptoms,  negative for: Oxygen at home, Productive cough and Wheezing  Neurologic:  positive for: No symptoms, negative for: Sudden weakness in arms or legs, Sudden numbness in arms or legs, Sudden onset of difficulty speaking or slurred speech, Temporary loss of vision in one eye and Problems with dizziness  Gastrointestinal:  positive for: no symptoms, negative for: Blood in stool and Vomited blood  Genitourinary:  positive for: no symptoms, negative for: Burning when urinating and Blood in urine  Psychiatric:  positive for: no symptoms,  negative for: Major depression  Hematologic:  positive for: no symptoms,  negative for: negative for: Bleeding problems and Problems with  blood clotting too easily  Dermatologic:  positive for: no symptoms, negative for: Rashes or ulcers  Constitutional:  positive for: no symptoms, negative for: Fever or chills  Ear/Nose/Throat:  positive for: no symptoms, negative for: Change in hearing, Nose bleeds and Sore throat  Musculoskeletal:  positive for: no symptoms, negative for: Back pain, Joint pain and Muscle pain   Physical Examination    Vitals:   07/26/16 1422  BP: 125/86  Pulse: (!) 101  Resp: 16  Temp: 98 F (36.7 C)  TempSrc: Oral  SpO2: 99%  Weight: 204 lb (92.5 kg)  Height: 5\' 11"  (1.803 m)    Body mass index is 28.45 kg/m.  General Alert, O x 3, WD, NAD  Head [place]/AT,    Ear/Nose/Throat Hearing grossly intact, nares without erythema or drainage, oropharynx without Erythema or Exudate, Mallampati score: 3, Dentition intact  Eyes PERRLA, EOMI,    Neck Supple, mid-line trachea,    Pulmonary Sym exp, good B air movt, CTA B  Cardiac RRR, Nl S1, S2, no Murmurs, No rubs, No S3,S4  Vascular Vessel Right Left  Radial Palpable Palpable  Brachial Palpable Palpable  Carotid Palpable Palpable  Aorta Not palpable due to pannus N/A  Femoral Palpable Palpable  Popliteal Not palpable Not palpable  PT Palpable Palpable  DP Palpable Palpable    Gastrointestinal soft, non-distended, non-tender to palpation, No guarding or rebound, no HSM, no masses, no CVAT B, No palpable prominent aortic pulse,    Musculoskeletal M/S 5/5 throughout  , Extremities without ischemic changes  , Edema present: B, Varicosities present: massive varicosities R>L, Lipodermatosclerosis present: B ankles  Neurologic Cranial nerves 2-12 intact , Pain and light touch intact in extremities , Motor exam as listed above  Psychiatric Judgement intact, Mood & affect appropriate for pt's clinical situation  Dermatologic See M/S exam for extremity exam, No rashes otherwise noted  Lymphatic  Palpable lymph nodes: None    Non-invasive  Vascular Imaging   BLE Venous Insufficiency Duplex (Date: 07/26/2016):   RLE:   No DVT and SVT,   + GSV reflux: 10-15 mm  no SSV reflux,  + deep venous reflux: CFV   LLE:  no DVT and SVT,   + GSV reflux: 1.7-4.0 mm  + SSV reflux: 6.5-15 mm  + deep venous reflux: CFV   Medical Decision Making   Doryce Mcgregory C Johnathan Crawford is a 56 y.o. male who presents with: BLE chronic venous insufficiency (C4 Ep As,d Pr), BLE varicose veins with complications   Based on the patient's history and examination, I recommend: compressive therapy.  I discussed with the patient the use of her 20-30 mm thigh high compression stockings and need for 3 month trial of such.  The patient will follow up in 3 months with  my partners in the [facility] for evaluation for: EVLA R GSV, possible EVLA L GSV and SSV.  Thank you for allowing [place] to participate in this patient's care.   Adele Barthel, MD, FACS [facility] Office: [phone] Pager: [phone]  07/26/2016, 2:56 PM

## 2016-07-29 ENCOUNTER — Encounter: Payer: Self-pay | Admitting: Family

## 2016-07-29 ENCOUNTER — Ambulatory Visit: Payer: Self-pay | Admitting: Family

## 2016-07-29 ENCOUNTER — Other Ambulatory Visit: Payer: Self-pay | Admitting: Family

## 2016-07-29 ENCOUNTER — Ambulatory Visit (INDEPENDENT_AMBULATORY_CARE_PROVIDER_SITE_OTHER): Payer: Managed Care, Other (non HMO) | Admitting: Family

## 2016-07-29 DIAGNOSIS — J302 Other seasonal allergic rhinitis: Secondary | ICD-10-CM | POA: Diagnosis not present

## 2016-07-29 DIAGNOSIS — I10 Essential (primary) hypertension: Secondary | ICD-10-CM | POA: Insufficient documentation

## 2016-07-29 MED ORDER — TRIAMTERENE-HCTZ 37.5-25 MG PO CAPS: 1.0000 | ORAL_CAPSULE | Freq: Every day | ORAL | 2 refills | Status: DC

## 2016-07-29 MED ORDER — AZELASTINE HCL 0.05 % OP SOLN: 1.0000 [drp] | Freq: Two times a day (BID) | OPHTHALMIC | 1 refills | Status: DC

## 2016-07-29 NOTE — Assessment & Plan Note (Signed)
Seasonal allergies remain labile with current medication regimen. Discontinue loratadine. Samples of Xyzal and Allegra provided. Start is a last and as needed for allergic conjunctivitis. Instructed to avoid Sudafed and Sudafed combined products. Consider additional nasal corticosteroid or possible montelukast if symptoms worsen or do not improve.

## 2016-07-29 NOTE — Patient Instructions (Addendum)
Thank you for choosing [facility].  SUMMARY AND INSTRUCTIONS:  Please decrease amlodipine to 5 mg daily and continue to monitor your blood pressure.  Start the azilastin for your eyes.   Continue with antihistamine and alternating Claratin and Claratin-D  Check out the Mediterranean type diet.   Work on physical activity and nutritional changes.   Medication:  Your prescription(s) have been submitted to your pharmacy or been printed and provided for you. Please take as directed and contact our office if you believe you are having problem(s) with the medication(s) or have any questions.  Follow up:  If your symptoms worsen or fail to improve, please contact our office for further instruction, or in case of emergency go directly to the emergency room at the closest medical facility.

## 2016-07-29 NOTE — Progress Notes (Signed)
Subjective:    Patient ID: Jerry Crawford, male    DOB: 1960/06/23, 56 y.o.   MRN: [id number]  Chief Complaint  Patient presents with  . Follow-up    HPI:  Jerry Crawford is a 56 y.o. male who  has a past medical history of Hypercholesteremia; Hyperlipidemia; and Hypertension. and presents today for a follow up office visit.  1.) Hypertension - Currently maintained on amlodipine and triamterene-hydrochlorothiazide. Reports taking the medications as prescribed and denies adverse side effects or hypotensive symptoms. Denies worst headache of life with no new symptoms of end organ damage. Blood pressures at home. Has been following a low-sodium diet.  BP Readings from Last 3 Encounters:  07/29/16 112/68  07/26/16 125/86  06/15/16 126/82   2.) Seasonal allergies - Currently maintained on loratadine. Reports taking medication as prescribed and denies adverse side effects. Continues to experience the associated symptoms of itchy watery eyes that has been refractory to over the counter medications.   No Known Allergies    Outpatient Medications Prior to Visit  Medication Sig Dispense Refill  . amLODipine (NORVASC) 10 MG tablet Take 10 mg by mouth daily.    . cyclobenzaprine (FLEXERIL) 10 MG tablet Take 1 tablet (10 mg total) by mouth 2 (two) times daily as needed for muscle spasms. 20 tablet 0  . ibuprofen (ADVIL,MOTRIN) 800 MG tablet Take 1 tablet (800 mg total) by mouth 3 (three) times daily. 21 tablet 0  . Multiple Vitamin (MULTIVITAMIN WITH MINERALS) TABS Take 1 tablet by mouth daily.    . Omega-3 Fatty Acids (FISH OIL PO) Take 1 capsule by mouth daily.    . sildenafil (VIAGRA) 100 MG tablet Take 0.5-1 tablets (50-100 mg total) by mouth daily as needed for erectile dysfunction. 6 tablet 0  . simvastatin (ZOCOR) 20 MG tablet Take 20 mg by mouth every evening.    . Tetrahydrozoline HCl (EYE DROPS OP) Apply 2 drops to eye as needed (for redness in eyes).    Marland Kitchen loratadine  (CLARITIN) 10 MG tablet Take 10 mg by mouth daily.    Marland Kitchen triamterene-hydrochlorothiazide (DYAZIDE) 37.5-25 MG capsule Take 1 capsule by mouth daily.     No facility-administered medications prior to visit.       No past surgical history on file.    Past Medical History:  Diagnosis Date  . Hypercholesteremia   . Hyperlipidemia   . Hypertension       Review of Systems  Constitutional: Negative for chills and fever.  HENT: Positive for congestion and sneezing.   Eyes: Positive for itching.       Negative for changes in vision  Respiratory: Negative for cough, chest tightness, shortness of breath and wheezing.   Cardiovascular: Negative for chest pain, palpitations and leg swelling.  Neurological: Negative for dizziness, weakness and light-headedness.      Objective:    BP 112/68 (BP Location: Left Arm, Patient Position: Sitting, Cuff Size: Large)   Pulse 86   Temp 98 F (36.7 C) (Oral)   Resp 16   Ht 5\' 11"  (1.803 m)   Wt 209 lb 12.8 oz (95.2 kg)   SpO2 98%   BMI 29.26 kg/m  Nursing note and vital signs reviewed.  Physical Exam  Constitutional: He is oriented to person, place, and time. He appears well-developed and well-nourished. No distress.  HENT:  Right Ear: Hearing, tympanic membrane, external ear and ear canal normal.  Left Ear: Hearing, tympanic membrane, external ear and ear canal normal.  Nose: Nose normal.  Mouth/Throat: Uvula is midline, oropharynx is clear and moist and mucous membranes are normal.  Cardiovascular: Normal rate, regular rhythm, normal heart sounds and intact distal pulses.   Pulmonary/Chest: Effort normal and breath sounds normal.  Neurological: He is alert and oriented to person, place, and time.  Skin: Skin is warm and dry.  Psychiatric: He has a normal mood and affect. His behavior is normal. Judgment and thought content normal.       Assessment & Plan:   Problem List Items Addressed This Visit      Cardiovascular and  Mediastinum   Hypertension    Blood pressure well-controlled and below goal 140/90 with current medication regimen and no adverse side effects. Denies hypotensive symptoms. Decrease amlodipine to 5 mg daily. Continue current dosage of triamterene-hydrochlorothiazide. Denies worse headache of life with no symptoms of end organ damage noted on physical exam. Monitor blood pressure at home and follow sodium diet. Continue to monitor.      Relevant Medications   triamterene-hydrochlorothiazide (DYAZIDE) 37.5-25 MG capsule     Respiratory   Seasonal allergies    Seasonal allergies remain labile with current medication regimen. Discontinue loratadine. Samples of Xyzal and Allegra provided. Start is a last and as needed for allergic conjunctivitis. Instructed to avoid Sudafed and Sudafed combined products. Consider additional nasal corticosteroid or possible montelukast if symptoms worsen or do not improve.      Relevant Medications   azelastine (OPTIVAR) 0.05 % ophthalmic solution       I have discontinued Mr. Gerlean Ren loratadine. I have also changed his triamterene-hydrochlorothiazide. Additionally, I am having him start on azelastine. Lastly, I am having him maintain his simvastatin, multivitamin with minerals, Omega-3 Fatty Acids (FISH OIL PO), Tetrahydrozoline HCl (EYE DROPS OP), cyclobenzaprine, ibuprofen, amLODipine, and sildenafil.   Meds ordered this encounter  Medications  . triamterene-hydrochlorothiazide (DYAZIDE) 37.5-25 MG capsule    Sig: Take 1 each (1 capsule total) by mouth daily.    Dispense:  90 capsule    Refill:  2  . azelastine (OPTIVAR) 0.05 % ophthalmic solution    Sig: Place 1 drop into both eyes 2 (two) times daily.    Dispense:  6 mL    Refill:  1    Order Specific Question:   Supervising Provider    Answer:   Pricilla Holm A [[id number]]     Follow-up: Return in about 3 months (around 10/28/2016), or if symptoms worsen or fail to improve.  Mauricio Po,  FNP

## 2016-07-29 NOTE — Assessment & Plan Note (Signed)
Blood pressure well-controlled and below goal 140/90 with current medication regimen and no adverse side effects. Denies hypotensive symptoms. Decrease amlodipine to 5 mg daily. Continue current dosage of triamterene-hydrochlorothiazide. Denies worse headache of life with no symptoms of end organ damage noted on physical exam. Monitor blood pressure at home and follow sodium diet. Continue to monitor.

## 2016-08-01 ENCOUNTER — Ambulatory Visit: Payer: Self-pay | Admitting: Family

## 2016-08-05 ENCOUNTER — Ambulatory Visit (INDEPENDENT_AMBULATORY_CARE_PROVIDER_SITE_OTHER): Payer: Managed Care, Other (non HMO) | Admitting: Orthopaedic Surgery

## 2016-08-05 ENCOUNTER — Encounter (INDEPENDENT_AMBULATORY_CARE_PROVIDER_SITE_OTHER): Payer: Self-pay | Admitting: Orthopaedic Surgery

## 2016-08-05 DIAGNOSIS — M67432 Ganglion, left wrist: Secondary | ICD-10-CM

## 2016-08-05 MED ORDER — METHYLPREDNISOLONE ACETATE 40 MG/ML IJ SUSP
13.3300 mg | INTRAMUSCULAR | Status: AC | PRN
Start: 2016-08-05 — End: 2016-08-05
  Administered 2016-08-05: 13.33 mg

## 2016-08-05 MED ORDER — BUPIVACAINE HCL 0.5 % IJ SOLN
0.3300 mL | INTRAMUSCULAR | Status: AC | PRN
  Administered 2016-08-05: .33 mL

## 2016-08-05 MED ORDER — LIDOCAINE HCL 1 % IJ SOLN
0.3000 mL | INTRAMUSCULAR | Status: AC | PRN
  Administered 2016-08-05: .3 mL

## 2016-08-05 NOTE — Progress Notes (Signed)
Office Visit Note   Patient: Jerry Crawford           Date of Birth: [birth year]           MRN: [id number] Visit Date: 08/05/2016              Requested by: [address], [place], [place] [address] PCP: Mauricio Po, FNP   Assessment & Plan: Visit Diagnoses: No diagnosis found.  Plan: The cyst was aspirated and injected with cortisone and sterile conditions patient tolerates well short wrist brace was also given. I would recommend wearing this for a couple weeks. Follow-up with me as needed.  Follow-Up Instructions: Return if symptoms worsen or fail to improve.   Orders:  No orders of the defined types were placed in this encounter.  No orders of the defined types were placed in this encounter.     Procedures: Hand/UE Inj Date/Time: 08/05/2016 11:41 AM Performed by: Leandrew Koyanagi Authorized by: Leandrew Koyanagi   Condition: volar carpal ganglion   Site:  L wrist Needle Size:  18 G Approach:  Volar Ultrasound Guidance: No   Medications:  13.33 mg methylPREDNISolone acetate 40 MG/ML; 0.33 mL bupivacaine 0.5 %; 0.3 mL lidocaine 1 %     Clinical Data: No additional findings.   Subjective: Chief Complaint  Patient presents with  . Left Wrist - Pain    Patient 56 year old gentleman who has a left wrist ganglion cyst for years that fluctuates in size. Denies any constitutional symptoms or any injuries. He does do repetitive work with his hands at work.    Review of Systems  Constitutional: Negative.   All other systems reviewed and are negative.    Objective: Vital Signs: There were no vitals taken for this visit.  Physical Exam  Constitutional: He is oriented to person, place, and time. He appears well-developed and well-nourished.  HENT:  Head: Normocephalic and atraumatic.  Eyes: Pupils are equal, round, and reactive to light.  Neck: Neck supple.  Pulmonary/Chest: Effort normal.  Abdominal: Soft.  Musculoskeletal: Normal range  of motion.  Neurological: He is alert and oriented to person, place, and time.  Skin: Skin is warm.  Psychiatric: He has a normal mood and affect. His behavior is normal. Judgment and thought content normal.  Nursing note and vitals reviewed.   Ortho Exam Left wrist exam shows a volar ganglion cyst without any overlying skin changes. He does have a palpable radial pulse on the radial aspect of the cyst. Specialty Comments:  No specialty comments available.  Imaging: No results found.   PMFS History: Patient Active Problem List   Diagnosis Date Noted  . Seasonal allergies 07/29/2016  . Hypertension 07/29/2016  . Encounter for general adult medical examination with abnormal findings 06/15/2016  . Decreased testosterone level 06/15/2016  . Ganglion cyst of wrist, left 06/15/2016  . Elevated fasting blood sugar 06/15/2016  . Varicose veins of both lower extremities with complications 61/60/7371  . Erectile dysfunction due to diseases classified elsewhere 06/15/2016   Past Medical History:  Diagnosis Date  . Hypercholesteremia   . Hyperlipidemia   . Hypertension     Family History  Problem Relation Age of Onset  . Asthma Mother   . Asthma Sister   . Asthma Brother   . Healthy Father   . Hypertension Mother   . Hypertension Sister     No past surgical history on file. Social History   Occupational History  .  Glass blower/designer    Social History Main Topics  . Smoking status: Never Smoker  . Smokeless tobacco: Never Used  . Alcohol use Yes     Comment: Rarely <1/month  . Drug use: No  . Sexual activity: Not on file

## 2016-09-01 ENCOUNTER — Other Ambulatory Visit: Payer: Self-pay | Admitting: Family

## 2016-10-14 ENCOUNTER — Encounter: Payer: Self-pay | Admitting: Vascular Surgery

## 2016-10-20 ENCOUNTER — Other Ambulatory Visit: Payer: Self-pay | Admitting: Family

## 2016-10-25 ENCOUNTER — Ambulatory Visit: Payer: Managed Care, Other (non HMO) | Admitting: Vascular Surgery

## 2016-10-26 ENCOUNTER — Ambulatory Visit (INDEPENDENT_AMBULATORY_CARE_PROVIDER_SITE_OTHER): Payer: Managed Care, Other (non HMO) | Admitting: Vascular Surgery

## 2016-10-26 ENCOUNTER — Encounter: Payer: Self-pay | Admitting: Vascular Surgery

## 2016-10-26 VITALS — BP 134/89 | HR 97 | Temp 97.3°F | Resp 16 | Ht 71.0 in | Wt 210.0 lb

## 2016-10-26 DIAGNOSIS — I868 Varicose veins of other specified sites: Secondary | ICD-10-CM

## 2016-10-26 DIAGNOSIS — I83893 Varicose veins of bilateral lower extremities with other complications: Secondary | ICD-10-CM | POA: Diagnosis not present

## 2016-10-26 NOTE — Progress Notes (Signed)
Subjective:     Patient ID: Jerry Crawford, male   DOB: 22-Dec-1960, 56 y.o.   MRN: [id number]  HPI This 56 year old male returns for continued follow-up regarding his bilateral painful varicosities and swelling. He was evaluated by Dr. Geryl Councilman 3 months ago and is tried long-leg elastic compression stockings 20-30 millimeter gradient as well as elevation and ibuprofen but continues to have aching throbbing and burning discomfort as well as progressive edema as the day wears on. He does work standing for 10 hour shifts. This is affecting his daily living in his ability to work and he would like treatment. He has no history of DVT or thrombophlebitis.  Past Medical History:  Diagnosis Date  . Hypercholesteremia   . Hyperlipidemia   . Hypertension     Social History  Substance Use Topics  . Smoking status: Never Smoker  . Smokeless tobacco: Never Used  . Alcohol use Yes     Comment: Rarely <1/month    Family History  Problem Relation Age of Onset  . Asthma Mother   . Asthma Sister   . Asthma Brother   . Healthy Father   . Hypertension Mother   . Hypertension Sister     No Known Allergies   Current Outpatient Prescriptions:  .  amLODipine (NORVASC) 10 MG tablet, take 1 tablet by mouth once daily, Disp: 90 tablet, Rfl: 1 .  azelastine (OPTIVAR) 0.05 % ophthalmic solution, Place 1 drop into both eyes 2 (two) times daily., Disp: 6 mL, Rfl: 1 .  cyclobenzaprine (FLEXERIL) 10 MG tablet, Take 1 tablet (10 mg total) by mouth 2 (two) times daily as needed for muscle spasms. (Patient not taking: Reported on 08/05/2016), Disp: 20 tablet, Rfl: 0 .  ibuprofen (ADVIL,MOTRIN) 800 MG tablet, Take 1 tablet (800 mg total) by mouth 3 (three) times daily. (Patient not taking: Reported on 08/05/2016), Disp: 21 tablet, Rfl: 0 .  Multiple Vitamin (MULTIVITAMIN WITH MINERALS) TABS, Take 1 tablet by mouth daily., Disp: , Rfl:  .  Omega-3 Fatty Acids (FISH OIL PO), Take 1 capsule by mouth daily., Disp: ,  Rfl:  .  sildenafil (VIAGRA) 100 MG tablet, Take 0.5-1 tablets (50-100 mg total) by mouth daily as needed for erectile dysfunction. (Patient not taking: Reported on 08/05/2016), Disp: 6 tablet, Rfl: 0 .  simvastatin (ZOCOR) 20 MG tablet, take 1 tablet by mouth once daily, Disp: 90 tablet, Rfl: 1 .  Tetrahydrozoline HCl (EYE DROPS OP), Apply 2 drops to eye as needed (for redness in eyes)., Disp: , Rfl:  .  triamterene-hydrochlorothiazide (DYAZIDE) 37.5-25 MG capsule, Take 1 each (1 capsule total) by mouth daily., Disp: 90 capsule, Rfl: 2 .  triamterene-hydrochlorothiazide (MAXZIDE-25) 37.5-25 MG tablet, take 1 tablet by mouth once daily (Patient not taking: Reported on 08/05/2016), Disp: 30 tablet, Rfl: 5  Vitals:   10/26/16 1325  BP: 134/89  Pulse: 97  Resp: 16  Temp: (!) 97.3 F (36.3 C)  SpO2: 99%  Weight: 210 lb (95.3 kg)  Height: 5\' 11"  (1.803 m)    Body mass index is 29.29 kg/m.         Review of Systems Denies chest pain, dyspnea on exertion, PND, orthopnea, hemoptysis, claudication    Objective:   Physical Exam BP 134/89   Pulse 97   Temp (!) 97.3 F (36.3 C)   Resp 16   Ht 5\' 11"  (1.803 m)   Wt 210 lb (95.3 kg)   SpO2 99%   BMI 29.29 kg/m   Gen.  well-developed well-nourished male no apparent distress alert and oriented 3 Lungs no rhonchi or wheezing Both legs with extensive bulging varicosities which are quite large. Left leg begins in the proximal third of the thigh extending medially over the great saphenous system to the medial malleolus with 1+ distal edema no active ulcer Left leg with extensive bulging varicosities medial thigh over great saphenous system extending into the medial calf to the medial malleolus. 1+ edema.  Today I reviewed the reflux venous study performed at the last visit and also performed a bedside SonoSite ultrasound exam for confirmation Patient has #1 gross reflux in the large caliber right great saphenous vein supplying these  painful varicosities in the right leg followed by #2 gross reflux and a very large left great saphenous vein supplying these painful varicosities. The vein on the left extends for relatively straight course over about 10-12 cm and then becomes quite tortuous. #3 the left small saphenous vein is quite large in caliber and has gross reflux throughout     Assessment:     Painful varicosities bilaterally due to gross reflux right great saphenous vein, left great saphenous vein, and left small saphenous vein causing symptoms which are affecting patient's daily living and ability to work and are resistant to conservative measures including long-leg elastic compression stockings 20-30 millimeter gradient, elevation, and ibuprofen-[id number]    Plan:     Patient needs #1 laser ablation left small saphenous vein followed by #2 laser ablation left great saphenous vein plus greater than 20 stab phlebectomy of painful varicosities followed by #3 laser ablation right great saphenous vein with greater than 20 stab phlebectomy of painful varicosities We will proceed with precertification to perform this in the near future and relieve his symptoms and improve his ability to work He understands that it is possible to have incomplete closure of large caliber great saphenous veins which can only be treated over a relatively short distance-left leg great saphenous vein. We will proceed with precertification to perform this in the near future

## 2016-10-28 ENCOUNTER — Ambulatory Visit: Payer: Managed Care, Other (non HMO) | Admitting: Family

## 2016-10-31 ENCOUNTER — Other Ambulatory Visit: Payer: Self-pay | Admitting: *Deleted

## 2016-10-31 DIAGNOSIS — I83893 Varicose veins of bilateral lower extremities with other complications: Secondary | ICD-10-CM

## 2016-11-04 ENCOUNTER — Other Ambulatory Visit (INDEPENDENT_AMBULATORY_CARE_PROVIDER_SITE_OTHER): Payer: Managed Care, Other (non HMO)

## 2016-11-04 ENCOUNTER — Encounter: Payer: Self-pay | Admitting: Family

## 2016-11-04 ENCOUNTER — Ambulatory Visit (INDEPENDENT_AMBULATORY_CARE_PROVIDER_SITE_OTHER): Payer: Managed Care, Other (non HMO) | Admitting: Family

## 2016-11-04 VITALS — BP 122/84 | HR 82 | Resp 16 | Ht 71.0 in | Wt 212.1 lb

## 2016-11-04 DIAGNOSIS — I10 Essential (primary) hypertension: Secondary | ICD-10-CM

## 2016-11-04 LAB — COMPREHENSIVE METABOLIC PANEL
ALBUMIN: 4.7 g/dL (ref 3.5–5.2)
ALT: 24 U/L (ref 0–53)
AST: 22 U/L (ref 0–37)
Alkaline Phosphatase: 48 U/L (ref 39–117)
BUN: 20 mg/dL (ref 6–23)
CALCIUM: 9.8 mg/dL (ref 8.4–10.5)
CO2: 28 mEq/L (ref 19–32)
Chloride: 104 mEq/L (ref 96–112)
Creatinine, Ser: 1.06 mg/dL (ref 0.40–1.50)
GFR: 93.63 mL/min (ref >60.00)
Glucose, Bld: 98 mg/dL (ref 70–99)
POTASSIUM: 3.7 meq/L (ref 3.5–5.1)
SODIUM: 140 meq/L (ref 135–145)
Total Bilirubin: 1.6 mg/dL — ABNORMAL HIGH (ref 0.2–1.2)
Total Protein: 7.4 g/dL (ref 6.0–8.3)

## 2016-11-04 LAB — HEMOGLOBIN A1C: HEMOGLOBIN A1C: 6.5 % (ref 4.6–6.5)

## 2016-11-04 NOTE — Patient Instructions (Signed)
Thank you for choosing [facility].  SUMMARY AND INSTRUCTIONS:  Please continue to take your medications as prescribed.   Monitor your blood pressure at home and follow a low sodium diet.   Medication:  Your prescription(s) have been submitted to your pharmacy or been printed and provided for you. Please take as directed and contact our office if you believe you are having problem(s) with the medication(s) or have any questions.  Labs:  Please stop by the lab on the lower level of the building for your blood work. Your results will be released to [place] (or called to you) after review, usually within 72 hours after test completion. If any changes need to be made, you will be notified at that same time.  1.) The lab is open from 7:30am to 5:30 pm Monday-Friday 2.) No appointment is necessary 3.) Fasting (if needed) is 6-8 hours after food and drink; black coffee and water are okay   Follow up:  If your symptoms worsen or fail to improve, please contact our office for further instruction, or in case of emergency go directly to the emergency room at the closest medical facility.

## 2016-11-04 NOTE — Progress Notes (Signed)
Subjective:    Patient ID: Jerry Crawford, male    DOB: 10-20-1960, 56 y.o.   MRN: [id number]  Chief Complaint  Patient presents with  . Follow-up    BP    HPI:  Jerry Crawford is a 56 y.o. male who  has a past medical history of Hypercholesteremia; Hyperlipidemia; and Hypertension. and presents today for a follow up office visit.  Hypertension - Currently maintained on Triamterene-hydrochlorothiazide and amlodipine and reports taking the medications as prescribed and denies adverse side effects or hypotensive readings. Blood pressures at home have been well controlled. Denies changes in vision, worst headache of life or new symptoms of end organ damage. Working on following a low sodium diet.                                                                                 BP Readings from Last 3 Encounters:  11/04/16 122/84  10/26/16 134/89  07/29/16 112/68     No Known Allergies    Outpatient Medications Prior to Visit  Medication Sig Dispense Refill  . amLODipine (NORVASC) 10 MG tablet take 1 tablet by mouth once daily 90 tablet 1  . azelastine (OPTIVAR) 0.05 % ophthalmic solution Place 1 drop into both eyes 2 (two) times daily. 6 mL 1  . cyclobenzaprine (FLEXERIL) 10 MG tablet Take 1 tablet (10 mg total) by mouth 2 (two) times daily as needed for muscle spasms. 20 tablet 0  . ibuprofen (ADVIL,MOTRIN) 800 MG tablet Take 1 tablet (800 mg total) by mouth 3 (three) times daily. 21 tablet 0  . Multiple Vitamin (MULTIVITAMIN WITH MINERALS) TABS Take 1 tablet by mouth daily.    . Omega-3 Fatty Acids (FISH OIL PO) Take 1 capsule by mouth daily.    . sildenafil (VIAGRA) 100 MG tablet Take 0.5-1 tablets (50-100 mg total) by mouth daily as needed for erectile dysfunction. 6 tablet 0  . simvastatin (ZOCOR) 20 MG tablet take 1 tablet by mouth once daily 90 tablet 1  . Tetrahydrozoline HCl (EYE DROPS OP) Apply 2 drops to eye as needed (for redness in eyes).    .  triamterene-hydrochlorothiazide (DYAZIDE) 37.5-25 MG capsule Take 1 each (1 capsule total) by mouth daily. 90 capsule 2  . triamterene-hydrochlorothiazide (MAXZIDE-25) 37.5-25 MG tablet take 1 tablet by mouth once daily 30 tablet 5   No facility-administered medications prior to visit.      Review of Systems  Constitutional: Negative for chills and fever.  Eyes:       Negative for changes in vision  Respiratory: Negative for cough, chest tightness and wheezing.   Cardiovascular: Negative for chest pain, palpitations and leg swelling.  Neurological: Negative for dizziness, weakness and light-headedness.      Objective:    BP 122/84 (BP Location: Left Arm, Patient Position: Sitting, Cuff Size: Large)   Pulse 82   Resp 16   Ht 5\' 11"  (1.803 m)   Wt 212 lb 1.9 oz (96.2 kg)   SpO2 98%   BMI 29.58 kg/m  Nursing note and vital signs reviewed.  Physical Exam  Constitutional: He is oriented to person, place, and time. He appears well-developed and well-nourished. No  distress.  Cardiovascular: Normal rate, regular rhythm, normal heart sounds and intact distal pulses.   Pulmonary/Chest: Effort normal and breath sounds normal.  Neurological: He is alert and oriented to person, place, and time.  Skin: Skin is warm and dry.  Psychiatric: He has a normal mood and affect. His behavior is normal. Judgment and thought content normal.       Assessment & Plan:   Problem List Items Addressed This Visit      Cardiovascular and Mediastinum   Hypertension - Primary    Blood pressure well-controlled below goal 140/90 with current medication regimen and no adverse side effects. Obtain complete middle profile and hemoglobin A1c. Continue current dosage of triamterene-hydrochlorothiazide and amlodipine. Encouraged monitor blood pressure at home and follow low-sodium diet.      Relevant Orders   Comprehensive metabolic panel   Hemoglobin A1c       I am having Mr. Jerry Crawford maintain his  multivitamin with minerals, Omega-3 Fatty Acids (FISH OIL PO), Tetrahydrozoline HCl (EYE DROPS OP), cyclobenzaprine, ibuprofen, sildenafil, triamterene-hydrochlorothiazide, azelastine, amLODipine, and simvastatin.   Follow-up: Return in about 3 months (around 02/04/2017), or if symptoms worsen or fail to improve.  Mauricio Po, FNP

## 2016-11-04 NOTE — Assessment & Plan Note (Signed)
Blood pressure well-controlled below goal 140/90 with current medication regimen and no adverse side effects. Obtain complete middle profile and hemoglobin A1c. Continue current dosage of triamterene-hydrochlorothiazide and amlodipine. Encouraged monitor blood pressure at home and follow low-sodium diet.

## 2016-11-07 ENCOUNTER — Telehealth: Payer: Self-pay | Admitting: Family

## 2016-11-07 ENCOUNTER — Encounter: Payer: Self-pay | Admitting: Vascular Surgery

## 2016-11-07 NOTE — Telephone Encounter (Signed)
Returned call. LVM for pt to call back for results.

## 2016-11-07 NOTE — Telephone Encounter (Signed)
Patient is requesting a call about his labs. Please follow up once you have a chance. Thank you.

## 2016-11-08 ENCOUNTER — Ambulatory Visit (INDEPENDENT_AMBULATORY_CARE_PROVIDER_SITE_OTHER): Payer: Managed Care, Other (non HMO) | Admitting: Vascular Surgery

## 2016-11-08 ENCOUNTER — Encounter: Payer: Self-pay | Admitting: Vascular Surgery

## 2016-11-08 VITALS — BP 129/78 | HR 93 | Temp 97.1°F | Resp 16 | Ht 71.0 in | Wt 214.0 lb

## 2016-11-08 DIAGNOSIS — I83892 Varicose veins of left lower extremities with other complications: Secondary | ICD-10-CM

## 2016-11-08 HISTORY — PX: ENDOVENOUS ABLATION SAPHENOUS VEIN W/ LASER: SUR449

## 2016-11-08 NOTE — Progress Notes (Signed)
Subjective:     Patient ID: Jerry Crawford, male   DOB: 12/06/1960, 56 y.o.   MRN: [id number]  HPI this 56 year old male had laser ablation of the left small saphenous vein performed under local tumescent anesthesia. A total of 1287 J of energy was utilized. He tolerated the procedure well. Review of Systems     Objective:   Physical Exam BP 129/78 (BP Location: Left Arm, Patient Position: Sitting, Cuff Size: Normal)   Pulse 93   Temp (!) 97.1 F (36.2 C) (Oral)   Resp 16   Ht 5\' 11"  (1.803 m)   Wt 214 lb (97.1 kg)   SpO2 100%   BMI 29.85 kg/m       Assessment:     Well-tolerated laser ablation left small saphenous vein performed under local tumescent anesthesia    Plan:     Return in 1 week for venous duplex exam to confirm closure left small saphenous vein Patient will have closure of left great saphenous vein plus multiple stab phlebectomy performed in the near future

## 2016-11-08 NOTE — Progress Notes (Signed)
Laser Ablation Procedure    Date: 11/08/2016   Jerry Crawford DOB:Aug 20, 1960  Consent signed: Yes    Surgeon:  Dr. Nelda Severe. Kellie Simmering  Procedure: Laser Ablation: left Small Saphenous Vein  BP 129/78 (BP Location: Left Arm, Patient Position: Sitting, Cuff Size: Normal)   Pulse 93   Temp (!) 97.1 F (36.2 C) (Oral)   Resp 16   Ht 5\' 11"  (1.803 m)   Wt 214 lb (97.1 kg)   SpO2 100%   BMI 29.85 kg/m   Tumescent Anesthesia: 325 cc 0.9% NaCl with 50 cc Lidocaine HCL with 1% Epi and 15 cc 8.4% NaHCO3  Local Anesthesia: 2 cc Lidocaine HCL and NaHCO3 (ratio 2:1)  Pulsed Mode: 15 watts, 513ms delay, 1.0 duration  Total Energy: 1287 Joules             Total Pulses: 86               Total Time: 1:26      Patient tolerated procedure well    Description of Procedure:  After marking the course of the secondary varicosities, the patient was placed on the operating table in the prone position, and the left leg was prepped and draped in sterile fashion.   Local anesthetic was administered and under ultrasound guidance the saphenous vein was accessed with a micro needle and guide wire; then the mirco puncture sheath was placed.  A guide wire was inserted saphenopopliteal junction , followed by a 5 french sheath.  The position of the sheath and then the laser fiber below the junction was confirmed using the ultrasound.  Tumescent anesthesia was administered along the course of the saphenous vein using ultrasound guidance. The patient was placed in Trendelenburg position and protective laser glasses were placed on patient and staff, and the laser was fired at 15 watts continuous mode advancing 1-48mm/second for a total of 1287 joules.  Steristrip was applied to IV insertion site.  ABD pads and thigh high compression hose applied to left calf and thigh. Ace wrap applied to left calf and thigh. Blood loss less than 15 cc. The patient ambulated out of the treatment room having tolerated the procedure  well.

## 2016-11-14 ENCOUNTER — Encounter: Payer: Self-pay | Admitting: Vascular Surgery

## 2016-11-15 ENCOUNTER — Encounter: Payer: Self-pay | Admitting: Vascular Surgery

## 2016-11-15 ENCOUNTER — Ambulatory Visit (HOSPITAL_COMMUNITY)
Admission: RE | Admit: 2016-11-15 | Discharge: 2016-11-15 | Disposition: A | Discharge status: Home / Self Care | Payer: Managed Care, Other (non HMO) | Source: Ambulatory Visit | Attending: Vascular Surgery | Admitting: Vascular Surgery

## 2016-11-15 ENCOUNTER — Ambulatory Visit (INDEPENDENT_AMBULATORY_CARE_PROVIDER_SITE_OTHER): Payer: Managed Care, Other (non HMO) | Admitting: Vascular Surgery

## 2016-11-15 ENCOUNTER — Other Ambulatory Visit: Payer: Self-pay | Admitting: *Deleted

## 2016-11-15 VITALS — BP 132/86 | HR 87 | Temp 98.8°F | Resp 20 | Ht 71.0 in | Wt 212.0 lb

## 2016-11-15 DIAGNOSIS — Z9889 Other specified postprocedural states: Secondary | ICD-10-CM | POA: Insufficient documentation

## 2016-11-15 DIAGNOSIS — I83893 Varicose veins of bilateral lower extremities with other complications: Secondary | ICD-10-CM | POA: Insufficient documentation

## 2016-11-15 NOTE — Progress Notes (Signed)
Subjective:     Patient ID: Jerry Crawford, male   DOB: December 30, 1960, 56 y.o.   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 He has had some mild-to-moderate discomfort in the posterior calf which is now improving. He has taken his ibuprofen and worn elastic compression stocking as instructed. He thinks that the swelling distally a slightly less.  Past Medical History:  Diagnosis Date  . Hypercholesteremia   . Hyperlipidemia   . Hypertension     Social History  Substance Use Topics  . Smoking status: Never Smoker  . Smokeless tobacco: Never Used  . Alcohol use Yes     Comment: Rarely <1/month    Family History  Problem Relation Age of Onset  . Asthma Mother   . Asthma Sister   . Asthma Brother   . Healthy Father   . Hypertension Mother   . Hypertension Sister     No Known Allergies   Current Outpatient Prescriptions:  .  amLODipine (NORVASC) 10 MG tablet, take 1 tablet by mouth once daily, Disp: 90 tablet, Rfl: 1 .  azelastine (OPTIVAR) 0.05 % ophthalmic solution, Place 1 drop into both eyes 2 (two) times daily., Disp: 6 mL, Rfl: 1 .  cyclobenzaprine (FLEXERIL) 10 MG tablet, Take 1 tablet (10 mg total) by mouth 2 (two) times daily as needed for muscle spasms., Disp: 20 tablet, Rfl: 0 .  ibuprofen (ADVIL,MOTRIN) 800 MG tablet, Take 1 tablet (800 mg total) by mouth 3 (three) times daily., Disp: 21 tablet, Rfl: 0 .  Multiple Vitamin (MULTIVITAMIN WITH MINERALS) TABS, Take 1 tablet by mouth daily., Disp: , Rfl:  .  Omega-3 Fatty Acids (FISH OIL PO), Take 1 capsule by mouth daily., Disp: , Rfl:  .  sildenafil (VIAGRA) 100 MG tablet, Take 0.5-1 tablets (50-100 mg total) by mouth daily as needed for erectile dysfunction., Disp: 6 tablet, Rfl: 0 .  simvastatin (ZOCOR) 20 MG tablet, take 1 tablet by mouth once daily, Disp:  90 tablet, Rfl: 1 .  Tetrahydrozoline HCl (EYE DROPS OP), Apply 2 drops to eye as needed (for redness in eyes)., Disp: , Rfl:  .  triamterene-hydrochlorothiazide (DYAZIDE) 37.5-25 MG capsule, Take 1 each (1 capsule total) by mouth daily., Disp: 90 capsule, Rfl: 2  Vitals:   11/15/16 1049  BP: 132/86  Pulse: 87  Resp: 20  Temp: 98.8 F (37.1 C)  TempSrc: Oral  SpO2: 100%  Weight: 212 lb (96.2 kg)  Height: 5\' 11"  (1.803 m)    Body mass index is 29.57 kg/m.         Review of Systems Denies chest pain, dyspnea on exertion, PND, orthopnea, hemoptysis, claudication    Objective:   Physical Exam BP 132/86 (BP Location: Left Arm, Patient Position: Sitting, Cuff Size: Normal)   Pulse 87   Temp 98.8 F (37.1 C) (Oral)   Resp 20   Ht 5\' 11"  (1.803 m)   Wt 212 lb (96.2 kg)   SpO2 100%   BMI 29.57 kg/m   Gen. well-developed well-nourished male no apparent distress alert and oriented 3 Lungs no rhonchi or wheezing Left leg with mild tenderness to deep palpation over small saphenous vein. 1+ chronic edema distally. 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 No  active ulcer noted.  Today I ordered a venous duplex exam left leg which I reviewed and interpreted. There is no DVT. There is total closure of the small saphenous vein up to near the saphenous popliteal junction     Assessment:     Successful laser ablation left small saphenous vein for gross reflux with extensive painful varicosities    Plan:     Plan laser ablation left great saphenous vein with greater than 20 stab phlebectomy of painful varicosities in the near future

## 2016-11-16 ENCOUNTER — Encounter: Payer: Self-pay | Admitting: Vascular Surgery

## 2016-11-23 ENCOUNTER — Ambulatory Visit (INDEPENDENT_AMBULATORY_CARE_PROVIDER_SITE_OTHER): Payer: Managed Care, Other (non HMO) | Admitting: Vascular Surgery

## 2016-11-23 ENCOUNTER — Encounter: Payer: Self-pay | Admitting: Vascular Surgery

## 2016-11-23 ENCOUNTER — Telehealth: Payer: Self-pay | Admitting: *Deleted

## 2016-11-23 VITALS — BP 122/78 | HR 78 | Temp 97.2°F | Resp 18 | Ht 71.0 in | Wt 212.0 lb

## 2016-11-23 DIAGNOSIS — I83892 Varicose veins of left lower extremities with other complications: Secondary | ICD-10-CM | POA: Diagnosis not present

## 2016-11-23 DIAGNOSIS — I868 Varicose veins of other specified sites: Secondary | ICD-10-CM

## 2016-11-23 HISTORY — PX: ENDOVENOUS ABLATION SAPHENOUS VEIN W/ LASER: SUR449

## 2016-11-23 NOTE — Progress Notes (Signed)
Subjective:     Patient ID: Jerry Crawford, male   DOB: 04/15/1960, 56 y.o.   MRN: [id number]  HPI This 56 year old male had laser ablation of the left great saphenous vein from the proximal thigh to the saphenofemoral junction plus greater than 20 stab phlebectomy of painful varicosities both performed under local tumescent anesthesia. A total of 733 J of energy was utilized. He tolerated both procedures well.  Review of Systems     Objective:   Physical Exam BP 122/78 (BP Location: Left Arm, Patient Position: Sitting, Cuff Size: Normal)   Pulse 78   Temp (!) 97.2 F (36.2 C) (Oral)   Resp 18   Ht 5\' 11"  (1.803 m)   Wt 212 lb (96.2 kg)   SpO2 100%   BMI 29.57 kg/m        Assessment:     Well-tolerated laser ablation left great saphenous vein plus greater than 20 stab phlebectomy of painful varicosities performed under local tumescent anesthesia    Plan:     Return in 1 week for venous duplex exam to confirm closure left great saphenous vein and this will complete patient's treatment regimen

## 2016-11-23 NOTE — Progress Notes (Signed)
Laser Ablation Procedure    Date: 11/23/2016   Jerry Crawford DOB:[birth year]  Consent signed: Yes    Surgeon:  Dr. Nelda Severe. Kellie Simmering  Procedure: Laser Ablation: left Greater Saphenous Vein  BP 122/78 (BP Location: Left Arm, Patient Position: Sitting, Cuff Size: Normal)   Pulse 78   Temp (!) 97.2 F (36.2 C) (Oral)   Resp 18   Ht 5\' 11"  (1.803 m)   Wt 212 lb (96.2 kg)   SpO2 100%   BMI 29.57 kg/m   Tumescent Anesthesia: 500 cc 0.9% NaCl with 50 cc Lidocaine HCL with 1% Epi and 15 cc 8.4% NaHCO3  Local Anesthesia: 6 cc Lidocaine HCL and NaHCO3 (ratio 2:1)  Pulsed Mode: 15 watts, 562ms delay, 1.0 duration  Total Energy:  733 Joules            Total Pulses:  49              Total Time:  0:49   Stab Phlebectomy: >20 Sites: Thigh and Calf  Patient tolerated procedure well    Description of Procedure:  After marking the course of the secondary varicosities, the patient was placed on the operating table in the supine position, and the left leg was prepped and draped in sterile fashion.   Local anesthetic was administered and under ultrasound guidance the saphenous vein was accessed with a micro needle and guide wire; then the mirco puncture sheath was placed.  A guide wire was inserted saphenofemoral junction , followed by a 5 french sheath.  The position of the sheath and then the laser fiber below the junction was confirmed using the ultrasound.  Tumescent anesthesia was administered along the course of the saphenous vein using ultrasound guidance. The patient was placed in Trendelenburg position and protective laser glasses were placed on patient and staff, and the laser was fired at 15 watts continuous mode advancing 1-1mm/second for a total of 733 joules.   For stab phlebectomies, local anesthetic was administered at the previously marked varicosities, and tumescent anesthesia was administered around the vessels.  Greater than 20 stab wounds were made using the tip of an 11  blade. And using the vein hook, the phlebectomies were performed using a hemostat to avulse the varicosities.  Adequate hemostasis was achieved.     Steri strips were applied to the stab wounds and ABD pads and thigh high compression stockings were applied.  Ace wrap bandages were applied over the phlebectomy sites and at the top of the saphenofemoral junction. Blood loss was less than 15 cc.  The patient ambulated out of the operating room having tolerated the procedure well.

## 2016-11-23 NOTE — Telephone Encounter (Signed)
[name] is s/p EVLA L GSV and stab phlebectomy >20 incisions by Tinnie Gens MD today.  (9:00AM case)  Jerry Crawford states he is "bleeding from one of the incisions on my upper thigh".  Instructed Mr. Kurka to lie down with his left leg elevated above the level of his heart and have his daughter (who is with him) press directly on the area of bleeding for 5-10 minutes or until the bleeding stopped. Then, apply the ABD pad and Ace wrap (that was sent home with him this morning) over the area of bleeding to give extra compression.  Mr. Meuser verbalized understanding.  Repeated instructions to his daughter.   Called to check on Mr. Kloss at 4:30PM and he is resting comfortably. States direct compression stopped bleeding (left upper thigh)  earlier and ABD pad and Ace wrap were applied by his daughter.

## 2016-11-24 ENCOUNTER — Encounter: Payer: Self-pay | Admitting: Vascular Surgery

## 2016-11-25 ENCOUNTER — Telehealth: Payer: Self-pay | Admitting: *Deleted

## 2016-11-25 NOTE — Telephone Encounter (Signed)
Mr. Jerry Crawford is s/p EVLA L GSV and stab phlebectomy >20 left leg on 11-23-2016.  Mr. Jerry Crawford has removed his compression dressing and reports no bleeding or oozing.  States all stab phlebectomy sites are dry and scabbed over except for one phlebectomy site at top of inner left thigh and one phlebectomy site mid-inner left thigh.  Instructed him to shower without water flowing directly on left leg and to wash and dry the left leg very gently.  Mr. Jerry Crawford has protective pad to place over left inner thigh phlebectomy sites noted above and then will apply thigh high compression hose.  Mr. Jerry Crawford is aware of post LA duplex and VV FU with Dr. Kellie Simmering on 11-28-2016.

## 2016-11-28 ENCOUNTER — Ambulatory Visit (HOSPITAL_COMMUNITY)
Admission: RE | Admit: 2016-11-28 | Discharge: 2016-11-28 | Disposition: A | Discharge status: Home / Self Care | Payer: Managed Care, Other (non HMO) | Source: Ambulatory Visit | Attending: Vascular Surgery | Admitting: Vascular Surgery

## 2016-11-28 ENCOUNTER — Encounter: Payer: Self-pay | Admitting: Vascular Surgery

## 2016-11-28 ENCOUNTER — Ambulatory Visit (INDEPENDENT_AMBULATORY_CARE_PROVIDER_SITE_OTHER): Payer: Managed Care, Other (non HMO) | Admitting: Vascular Surgery

## 2016-11-28 VITALS — BP 138/75 | HR 98 | Temp 97.9°F | Resp 18

## 2016-11-28 DIAGNOSIS — I83892 Varicose veins of left lower extremities with other complications: Secondary | ICD-10-CM

## 2016-11-28 DIAGNOSIS — I83893 Varicose veins of bilateral lower extremities with other complications: Secondary | ICD-10-CM | POA: Insufficient documentation

## 2016-11-28 DIAGNOSIS — Z9889 Other specified postprocedural states: Secondary | ICD-10-CM | POA: Diagnosis not present

## 2016-11-28 NOTE — Progress Notes (Signed)
Subjective:     Patient ID: BLANCHE GALLIEN, male   DOB: 07/30/1960, 56 y.o.   MRN: [id number]  HPI This 56 year old male returns 1 week post-laser ablation left great saphenous vein plus greater than 20 stab phlebectomy of painful varicosities. He had the upper third of the great saphenous vein closed because distal to this point it became quite tortuous and the majority of this was removed through multiple stab phlebectomy wounds. He had some moderate discomfort initially but that is resolving. He has not completely return to work yet at the suggestion of his Fredrich Birks.He denies any chest pain, dyspnea on exertion, PND, orthopnea, hemoptysis. He is wearing his elastic compression stocking and taken ibuprofen as instructed.  Review of Systems     Objective:   Physical Exam BP 138/75 (BP Location: Left Arm, Patient Position: Sitting, Cuff Size: Large)   Pulse 98   Temp 97.9 F (36.6 C)   Resp 18   SpO2 100%   Gen. well-developed well-nourished male no apparent distress alert and oriented 3 Lungs no rhonchi or wheezing Left leg with diffuse ecchymosis medial thigh from proximal third to the knee with well healing stab phlebectomy sites. Minimal distal edema noted. 2+ dorsalis pedis pulse palpable. Minimal tenderness to deep palpation.  Today I ordered a venous duplex exam which I reviewed and interpreted. There is no DVT. There is total closure left great saphenous vein in the upper third of the thigh up to near the saphenofemoral junction     Assessment:     Successful laser ablation left great saphenous vein with multiple stab phlebectomy for painful varicosities    Plan:     We'll proceed with similar procedure and contralateral right leg in about 3-4 weeks

## 2016-11-30 ENCOUNTER — Other Ambulatory Visit: Payer: Self-pay | Admitting: *Deleted

## 2016-11-30 DIAGNOSIS — I83893 Varicose veins of bilateral lower extremities with other complications: Secondary | ICD-10-CM

## 2016-12-26 ENCOUNTER — Ambulatory Visit (INDEPENDENT_AMBULATORY_CARE_PROVIDER_SITE_OTHER): Payer: Managed Care, Other (non HMO) | Admitting: Vascular Surgery

## 2016-12-26 ENCOUNTER — Encounter: Payer: Self-pay | Admitting: Vascular Surgery

## 2016-12-26 VITALS — BP 121/78 | HR 86 | Temp 98.7°F | Resp 16 | Ht 71.0 in | Wt 212.0 lb

## 2016-12-26 DIAGNOSIS — I83891 Varicose veins of right lower extremities with other complications: Secondary | ICD-10-CM | POA: Diagnosis not present

## 2016-12-26 HISTORY — PX: ENDOVENOUS ABLATION SAPHENOUS VEIN W/ LASER: SUR449

## 2016-12-26 NOTE — Progress Notes (Signed)
Subjective:     Patient ID: Jerry Crawford, male   DOB: [birth year], 56 y.o.   MRN: [id number]  HPI This 56 year old male had laser ablation of the right great saphenous vein from the proximal calf to near the saphenofemoral junction plus greater than 20 stab phlebectomy of painful varicosities-approximately 35-all performed under local tumescent anesthesia. He tolerated both procedures well. A total of 3362 J of energy was utilized.  Review of Systems     Objective:   Physical Exam BP 121/78 (BP Location: Left Arm, Patient Position: Sitting, Cuff Size: Large)   Pulse 86   Temp 98.7 F (37.1 C) (Oral)   Resp 16   Ht 5\' 11"  (1.803 m)   Wt 212 lb (96.2 kg)   SpO2 100%   BMI 29.57 kg/m        Assessment:     Well-tolerated laser ablation right great saphenous vein plus proximally 35 stab phlebectomy of painful varicosities performed under local tumescent anesthesia    Plan:     Return in 1 week for venous duplex exam to confirm closure right great saphenous vein and this will complete patient's treatment regimen

## 2016-12-26 NOTE — Progress Notes (Signed)
Laser Ablation Procedure    Date: 12/26/2016   Jerry Crawford DOB:03-Mar-1961  Consent signed: Yes    Surgeon:  Dr. Nelda Severe. Kellie Simmering  Procedure: Laser Ablation: right Greater Saphenous Vein  BP 121/78 (BP Location: Left Arm, Patient Position: Sitting, Cuff Size: Large)   Pulse 86   Temp 98.7 F (37.1 C) (Oral)   Resp 16   Ht 5\' 11"  (1.803 m)   Wt 212 lb (96.2 kg)   SpO2 100%   BMI 29.57 kg/m   Tumescent Anesthesia: 550 cc 0.9% NaCl with 50 cc Lidocaine HCL with 1% Epi and 15 cc 8.4% NaHCO3  Local Anesthesia: 8 cc Lidocaine HCL and NaHCO3 (ratio 2:1)  Pulsed Mode: 17 watts, 525ms delay, 1.0 duration  Total Energy: 3362 Joules             Total Pulses:  199              Total Time: 3:18    Stab Phlebectomy: 35 Sites: Thigh and Calf  Patient tolerated procedure well    Description of Procedure:  After marking the course of the secondary varicosities, the patient was placed on the operating table in the supine position, and the right leg was prepped and draped in sterile fashion.   Local anesthetic was administered and under ultrasound guidance the saphenous vein was accessed with a micro needle and guide wire; then the mirco puncture sheath was placed.  A guide wire was inserted saphenofemoral junction , followed by a 5 french sheath.  The position of the sheath and then the laser fiber below the junction was confirmed using the ultrasound.  Tumescent anesthesia was administered along the course of the saphenous vein using ultrasound guidance. The patient was placed in Trendelenburg position and protective laser glasses were placed on patient and staff, and the laser was fired at 17 watts continuous mode advancing 1-67mm/second for a total of 3362 joules.   For stab phlebectomies, local anesthetic was administered at the previously marked varicosities, and tumescent anesthesia was administered around the vessels.  Greater than 20 stab wounds were made using the tip of an 11  blade. And using the vein hook, the phlebectomies were performed using a hemostat to avulse the varicosities.  Adequate hemostasis was achieved.     Steri strips were applied to the stab wounds and ABD pads and thigh high compression stockings were applied.  Ace wrap bandages were applied over the phlebectomy sites and at the top of the saphenofemoral junction. Blood loss was less than 15 cc.  The patient ambulated out of the operating room having tolerated the procedure well.

## 2016-12-27 ENCOUNTER — Encounter: Payer: Self-pay | Admitting: Vascular Surgery

## 2017-01-04 ENCOUNTER — Ambulatory Visit (INDEPENDENT_AMBULATORY_CARE_PROVIDER_SITE_OTHER): Payer: Self-pay | Admitting: Vascular Surgery

## 2017-01-04 ENCOUNTER — Ambulatory Visit (HOSPITAL_COMMUNITY)
Admission: RE | Admit: 2017-01-04 | Discharge: 2017-01-04 | Disposition: A | Discharge status: Home / Self Care | Payer: Managed Care, Other (non HMO) | Source: Ambulatory Visit | Attending: Vascular Surgery | Admitting: Vascular Surgery

## 2017-01-04 ENCOUNTER — Encounter: Payer: Self-pay | Admitting: Vascular Surgery

## 2017-01-04 VITALS — BP 134/79 | HR 87 | Temp 98.5°F | Resp 16 | Ht 71.0 in | Wt 210.0 lb

## 2017-01-04 DIAGNOSIS — I83893 Varicose veins of bilateral lower extremities with other complications: Secondary | ICD-10-CM

## 2017-01-04 DIAGNOSIS — I83891 Varicose veins of right lower extremities with other complications: Secondary | ICD-10-CM

## 2017-01-04 DIAGNOSIS — Z9889 Other specified postprocedural states: Secondary | ICD-10-CM | POA: Insufficient documentation

## 2017-01-04 NOTE — Progress Notes (Signed)
Subjective:     Patient ID: Jerry Crawford, male   DOB: [birth year], 56 y.o.   MRN: [id number]  HPI this 57 year old male returns 1 week post-laser ablation right great saphenous vein plus proximally 35 stab phlebectomy of painful varicosities both performed under local tumescent anesthesia. He states of the right leg feels much better with improvement in the swelling in the bulges already. He has had no significant discomfort other than some in the medial thigh area which has responded to ibuprofen and ice. He is ambulating well. He is still concerned about some of the recurrent bulges in the left medial thigh.  Past Medical History:  Diagnosis Date  . Hypercholesteremia   . Hyperlipidemia   . Hypertension     Social History  Substance Use Topics  . Smoking status: Never Smoker  . Smokeless tobacco: Never Used  . Alcohol use Yes     Comment: Rarely <1/month    Family History  Problem Relation Age of Onset  . Asthma Mother   . Asthma Sister   . Asthma Brother   . Healthy Father   . Hypertension Mother   . Hypertension Sister     No Known Allergies   Current Outpatient Prescriptions:  .  amLODipine (NORVASC) 10 MG tablet, take 1 tablet by mouth once daily, Disp: 90 tablet, Rfl: 1 .  azelastine (OPTIVAR) 0.05 % ophthalmic solution, Place 1 drop into both eyes 2 (two) times daily., Disp: 6 mL, Rfl: 1 .  cyclobenzaprine (FLEXERIL) 10 MG tablet, Take 1 tablet (10 mg total) by mouth 2 (two) times daily as needed for muscle spasms., Disp: 20 tablet, Rfl: 0 .  desonide (DESOWEN) 0.05 % lotion, , Disp: , Rfl: 0 .  ibuprofen (ADVIL,MOTRIN) 800 MG tablet, Take 1 tablet (800 mg total) by mouth 3 (three) times daily., Disp: 21 tablet, Rfl: 0 .  Multiple Vitamin (MULTIVITAMIN WITH MINERALS) TABS, Take 1 tablet by mouth daily., Disp: , Rfl:  .  Omega-3 Fatty Acids (FISH OIL PO), Take 1 capsule by mouth daily., Disp: , Rfl:  .  sildenafil (VIAGRA) 100 MG tablet, Take 0.5-1 tablets (50-100  mg total) by mouth daily as needed for erectile dysfunction., Disp: 6 tablet, Rfl: 0 .  simvastatin (ZOCOR) 20 MG tablet, take 1 tablet by mouth once daily, Disp: 90 tablet, Rfl: 1 .  Tetrahydrozoline HCl (EYE DROPS OP), Apply 2 drops to eye as needed (for redness in eyes)., Disp: , Rfl:  .  triamterene-hydrochlorothiazide (DYAZIDE) 37.5-25 MG capsule, Take 1 each (1 capsule total) by mouth daily., Disp: 90 capsule, Rfl: 2 .  triamterene-hydrochlorothiazide (MAXZIDE-25) 37.5-25 MG tablet, , Disp: , Rfl: 0  Vitals:   01/04/17 0947  BP: 134/79  Pulse: 87  Resp: 16  Temp: 98.5 F (36.9 C)  SpO2: 100%  Weight: 210 lb (95.3 kg)  Height: 5\' 11"  (1.803 m)    Body mass index is 29.29 kg/m.        Review of Systems     Objective:   Physical Exam BP 134/79   Pulse 87   Temp 98.5 F (36.9 C)   Resp 16   Ht 5\' 11"  (1.803 m)   Wt 210 lb (95.3 kg)   SpO2 100%   BMI 29.29 kg/m   Gen. well-developed well-nourished male no apparent distress alert and oriented 3 Right leg with well healing stab phlebectomy sites with Steri-Strips in place. Minimal distal edema noted. Mild tenderness to deep palpation over great saphenous vein in mid  thigh. 3 posterior cells pedis pulse palpable.  Left leg has some varicosities in the medial thigh beginning in the proximal third extending to the distal third over the great saphenous vein. 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     Assessment:     Successful laser ablation bilateral great saphenous veins with multiple stab phlebectomy with some patency of left great saphenous vein from the saphenofemoral junction to the mid thigh-large caliber vein    Plan:     Patient will return in 6 months for repeat venous duplex exam of left leg to see if great saphenous vein has totally thrombosed or  remains patent and to see the status of these residual varicosities in the mid thigh

## 2017-01-05 NOTE — Addendum Note (Signed)
Addended by: Kambrey Hagger A on: 01/05/2017 04:28 PM   Modules accepted: Orders  

## 2017-01-09 ENCOUNTER — Encounter (HOSPITAL_COMMUNITY): Payer: Managed Care, Other (non HMO)

## 2017-01-09 ENCOUNTER — Ambulatory Visit: Payer: Managed Care, Other (non HMO) | Admitting: Vascular Surgery

## 2017-01-18 ENCOUNTER — Other Ambulatory Visit: Payer: Self-pay | Admitting: Family

## 2017-03-07 ENCOUNTER — Other Ambulatory Visit: Payer: Self-pay | Admitting: *Deleted

## 2017-03-07 NOTE — Telephone Encounter (Signed)
Rec'd fax pt requesting refill on her Amlodipine. Last filled 01/20/17. Rx faxed back denied pt need to make appt w/new provider...Johny Chess

## 2017-03-13 ENCOUNTER — Ambulatory Visit: Payer: Managed Care, Other (non HMO) | Admitting: Family Medicine

## 2017-03-13 ENCOUNTER — Encounter: Payer: Self-pay | Admitting: Family Medicine

## 2017-03-13 VITALS — BP 118/64 | HR 85 | Temp 98.2°F | Ht 71.0 in | Wt 208.0 lb

## 2017-03-13 DIAGNOSIS — I10 Essential (primary) hypertension: Secondary | ICD-10-CM

## 2017-03-13 DIAGNOSIS — R7301 Impaired fasting glucose: Secondary | ICD-10-CM

## 2017-03-13 DIAGNOSIS — F524 Premature ejaculation: Secondary | ICD-10-CM | POA: Diagnosis not present

## 2017-03-13 DIAGNOSIS — R7989 Other specified abnormal findings of blood chemistry: Secondary | ICD-10-CM | POA: Diagnosis not present

## 2017-03-13 DIAGNOSIS — I83891 Varicose veins of right lower extremities with other complications: Secondary | ICD-10-CM

## 2017-03-13 MED ORDER — PAROXETINE HCL 10 MG PO TABS: 10.0000 mg | ORAL_TABLET | Freq: Every day | ORAL | 11 refills | Status: DC

## 2017-03-13 MED ORDER — TRIAMTERENE-HCTZ 37.5-25 MG PO TABS: 1.0000 | ORAL_TABLET | Freq: Every day | ORAL | 11 refills | Status: DC

## 2017-03-13 MED ORDER — DESONIDE 0.05 % EX LOTN: TOPICAL_LOTION | Freq: Two times a day (BID) | CUTANEOUS | 11 refills | Status: AC

## 2017-03-13 MED ORDER — SIMVASTATIN 20 MG PO TABS: 20.0000 mg | ORAL_TABLET | Freq: Every day | ORAL | 11 refills | Status: DC

## 2017-03-13 MED ORDER — AMLODIPINE BESYLATE 10 MG PO TABS: 10.0000 mg | ORAL_TABLET | Freq: Every day | ORAL | 11 refills | Status: DC

## 2017-03-13 NOTE — Progress Notes (Signed)
   Subjective:    Patient ID: Jerry Crawford, male    DOB: 07-29-1960, 56 y.o.   MRN: [id number]  HPI 56 yr old male to establish with [place] after transferring from Terri Piedra NP. He is doing well except for one issue. For several years he has had problems with premature ejaculation. He has never had erection difficulties. He was given Viagra to try but this did not help. His BP has been stable at home. He has had several procedures per Dr. 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   Review of Systems  Constitutional: Negative.   Respiratory: Negative.   Cardiovascular: Negative.   Genitourinary: Negative.   Neurological: Negative.   Psychiatric/Behavioral: Negative.        Objective:   Physical Exam  Constitutional: He is oriented to person, place, and time. He appears well-developed and well-nourished.  Neck: No thyromegaly present.  Cardiovascular: Normal rate, regular rhythm, normal heart sounds and intact distal pulses.  Pulmonary/Chest: Effort normal and breath sounds normal.  Lymphadenopathy:    He has no cervical adenopathy.  Neurological: He is alert and oriented to person, place, and time.          Assessment & Plan:  Intro visit for this patient who complains of premature ejaculation. He will try taking Paxil 10 mg daily. Recheck at his well exam next month. At that time we will set him up for his first colonoscopy as well.  Alysia Penna, MD

## 2017-04-21 ENCOUNTER — Encounter: Payer: Self-pay | Admitting: Family Medicine

## 2017-04-21 ENCOUNTER — Encounter: Payer: Self-pay | Admitting: Gastroenterology

## 2017-04-21 ENCOUNTER — Ambulatory Visit (INDEPENDENT_AMBULATORY_CARE_PROVIDER_SITE_OTHER): Payer: Managed Care, Other (non HMO) | Admitting: Family Medicine

## 2017-04-21 VITALS — BP 122/76 | HR 79 | Temp 98.0°F | Ht 71.0 in | Wt 212.0 lb

## 2017-04-21 DIAGNOSIS — Z0001 Encounter for general adult medical examination with abnormal findings: Secondary | ICD-10-CM

## 2017-04-21 DIAGNOSIS — Z0000 Encounter for general adult medical examination without abnormal findings: Secondary | ICD-10-CM

## 2017-04-21 DIAGNOSIS — N529 Male erectile dysfunction, unspecified: Secondary | ICD-10-CM | POA: Diagnosis not present

## 2017-04-21 LAB — CBC WITH DIFFERENTIAL/PLATELET
Basophils Absolute: 0 10*3/uL (ref 0.0–0.1)
Basophils Relative: 0.6 % (ref 0.0–3.0)
Eosinophils Absolute: 0.1 10*3/uL (ref 0.0–0.7)
Eosinophils Relative: 1.7 % (ref 0.0–5.0)
HCT: 49.7 % (ref 39.0–52.0)
HEMOGLOBIN: 16.6 g/dL (ref 13.0–17.0)
Lymphocytes Relative: 47.8 % — ABNORMAL HIGH (ref 12.0–46.0)
Lymphs Abs: 1.8 10*3/uL (ref 0.7–4.0)
MCHC: 33.3 g/dL (ref 30.0–36.0)
MCV: 87.8 fl (ref 78.0–100.0)
MONO ABS: 0.5 10*3/uL (ref 0.1–1.0)
MONOS PCT: 12.2 % — AB (ref 3.0–12.0)
Neutro Abs: 1.4 10*3/uL (ref 1.4–7.7)
Neutrophils Relative %: 37.7 % — ABNORMAL LOW (ref 43.0–77.0)
Platelets: 248 10*3/uL (ref 150.0–400.0)
RBC: 5.67 Mil/uL (ref 4.22–5.81)
RDW: 13.9 % (ref 11.5–15.5)
WBC: 3.8 10*3/uL — AB (ref 4.0–10.5)

## 2017-04-21 LAB — POCT URINALYSIS DIPSTICK
BILIRUBIN UA: NEGATIVE
Blood, UA: NEGATIVE
Glucose, UA: NEGATIVE
KETONES UA: NEGATIVE
Leukocytes, UA: NEGATIVE
Nitrite, UA: NEGATIVE
Protein, UA: NEGATIVE
Spec Grav, UA: 1.015 (ref 1.010–1.025)
UROBILINOGEN UA: 0.2 U/dL
pH, UA: 7 (ref 5.0–8.0)

## 2017-04-21 LAB — LIPID PANEL
Cholesterol: 128 mg/dL (ref 0–200)
HDL: 48.7 mg/dL (ref >39.00)
LDL Cholesterol: 67 mg/dL (ref 0–99)
NonHDL: 78.89
Total CHOL/HDL Ratio: 3
Triglycerides: 57 mg/dL (ref 0.0–149.0)
VLDL: 11.4 mg/dL (ref 0.0–40.0)

## 2017-04-21 LAB — BASIC METABOLIC PANEL
BUN: 19 mg/dL (ref 6–23)
CALCIUM: 9.7 mg/dL (ref 8.4–10.5)
CO2: 33 mEq/L — ABNORMAL HIGH (ref 19–32)
CREATININE: 0.98 mg/dL (ref 0.40–1.50)
Chloride: 98 mEq/L (ref 96–112)
GFR: 102.33 mL/min (ref >60.00)
Glucose, Bld: 94 mg/dL (ref 70–99)
Potassium: 3.7 mEq/L (ref 3.5–5.1)
Sodium: 138 mEq/L (ref 135–145)

## 2017-04-21 LAB — HEPATIC FUNCTION PANEL
ALT: 22 U/L (ref 0–53)
AST: 21 U/L (ref 0–37)
Albumin: 4.8 g/dL (ref 3.5–5.2)
Alkaline Phosphatase: 51 U/L (ref 39–117)
BILIRUBIN TOTAL: 1.6 mg/dL — AB (ref 0.2–1.2)
Bilirubin, Direct: 0.3 mg/dL (ref 0.0–0.3)
Total Protein: 7.2 g/dL (ref 6.0–8.3)

## 2017-04-21 LAB — PSA: PSA: 0.59 ng/mL (ref 0.10–4.00)

## 2017-04-21 LAB — TSH: TSH: 1.35 u[IU]/mL (ref 0.35–4.50)

## 2017-04-21 MED ORDER — TADALAFIL 20 MG PO TABS: 20.0000 mg | ORAL_TABLET | Freq: Every day | ORAL | 11 refills | Status: DC | PRN

## 2017-04-21 NOTE — Progress Notes (Signed)
   Subjective:    Patient ID: Jerry Crawford, male    DOB: 1960/11/06, 57 y.o.   MRN: [id number]  HPI Here for a well exam. His only concern is about erection issues. He has been taking Paxil 10 mg a day for the past few weeks and this has been very effective at slowing down the premature ejaculations. However his erections are not as firm as they used to be.    Review of Systems  Constitutional: Negative.   HENT: Negative.   Eyes: Negative.   Respiratory: Negative.   Cardiovascular: Negative.   Gastrointestinal: Negative.   Genitourinary: Negative.   Musculoskeletal: Negative.   Skin: Negative.   Neurological: Negative.   Psychiatric/Behavioral: Negative.        Objective:   Physical Exam  Constitutional: He is oriented to person, place, and time. He appears well-developed and well-nourished. No distress.  HENT:  Head: Normocephalic and atraumatic.  Right Ear: External ear normal.  Left Ear: External ear normal.  Nose: Nose normal.  Mouth/Throat: Oropharynx is clear and moist. No oropharyngeal exudate.  Eyes: Conjunctivae and EOM are normal. Pupils are equal, round, and reactive to light. Right eye exhibits no discharge. Left eye exhibits no discharge. No scleral icterus.  Neck: Neck supple. No JVD present. No tracheal deviation present. No thyromegaly present.  Cardiovascular: Normal rate, regular rhythm, normal heart sounds and intact distal pulses. Exam reveals no gallop and no friction rub.  No murmur heard. Pulmonary/Chest: Effort normal and breath sounds normal. No respiratory distress. He has no wheezes. He has no rales. He exhibits no tenderness.  Abdominal: Soft. Bowel sounds are normal. He exhibits no distension and no mass. There is no tenderness. There is no rebound and no guarding.  Genitourinary: Rectum normal, prostate normal and penis normal. Rectal exam shows guaiac negative stool. No penile tenderness.  Musculoskeletal: Normal range of motion. He exhibits  no edema or tenderness.  Lymphadenopathy:    He has no cervical adenopathy.  Neurological: He is alert and oriented to person, place, and time. He has normal reflexes. No cranial nerve deficit. He exhibits normal muscle tone. Coordination normal.  Skin: Skin is warm and dry. No rash noted. He is not diaphoretic. No erythema. No pallor.  Psychiatric: He has a normal mood and affect. His behavior is normal. Judgment and thought content normal.          Assessment & Plan:  Well exam. We discussed diet and exercise. Get fasting labs. Set up his first colonoscopy. Try Cialis for the ED.  Alysia Penna, MD

## 2017-04-28 ENCOUNTER — Ambulatory Visit: Payer: Managed Care, Other (non HMO) | Admitting: Family Medicine

## 2017-04-28 ENCOUNTER — Ambulatory Visit (INDEPENDENT_AMBULATORY_CARE_PROVIDER_SITE_OTHER)
Admission: RE | Admit: 2017-04-28 | Discharge: 2017-04-28 | Disposition: A | Discharge status: Home / Self Care | Payer: Managed Care, Other (non HMO) | Source: Ambulatory Visit | Attending: Family Medicine | Admitting: Family Medicine

## 2017-04-28 ENCOUNTER — Encounter: Payer: Self-pay | Admitting: Family Medicine

## 2017-04-28 VITALS — BP 128/78 | Temp 98.2°F | Wt 212.8 lb

## 2017-04-28 DIAGNOSIS — G4452 New daily persistent headache (NDPH): Secondary | ICD-10-CM | POA: Diagnosis not present

## 2017-04-28 DIAGNOSIS — M5440 Lumbago with sciatica, unspecified side: Secondary | ICD-10-CM

## 2017-04-28 LAB — POCT URINALYSIS DIPSTICK
Bilirubin, UA: NEGATIVE
Blood, UA: NEGATIVE
Glucose, UA: NEGATIVE
Ketones, UA: NEGATIVE
LEUKOCYTES UA: NEGATIVE
NITRITE UA: NEGATIVE
PROTEIN UA: NEGATIVE
SPEC GRAV UA: 1.015 (ref 1.010–1.025)
Urobilinogen, UA: 0.2 E.U./dL
pH, UA: 6.5 (ref 5.0–8.0)

## 2017-04-28 NOTE — Addendum Note (Signed)
Addended by: Myriam Forehand on: 04/28/2017 10:57 AM   Modules accepted: Orders

## 2017-04-28 NOTE — Progress Notes (Signed)
   Subjective:    Patient ID: CLAUDIO MONDRY, male    DOB: [birth year], 57 y.o.   MRN: [id number]  HPI Here for 2 concerns that came up about the same time 6 days ago. He describes a dull constant headache on the left side of the head. No light sensitivity. No vision changes. No neurologic deficits. No sinus congestion. Ibuprofen makes it stop but then the pain returns. He also describes a dull intermittent pain in the lower back which he has never had before. No radiation to the legs. No change in BMs or urinations.    Review of Systems  Constitutional: Negative.   HENT: Negative.   Eyes: Negative.   Respiratory: Negative.   Cardiovascular: Negative.   Musculoskeletal: Positive for back pain.  Neurological: Positive for headaches. Negative for dizziness, tremors, seizures, syncope, facial asymmetry, speech difficulty, weakness, light-headedness and numbness.       Objective:   Physical Exam  Constitutional: He appears well-developed and well-nourished.  HENT:  Head: Normocephalic and atraumatic.  Right Ear: External ear normal.  Left Ear: External ear normal.  Nose: Nose normal.  Mouth/Throat: Oropharynx is clear and moist.  Eyes: Conjunctivae and EOM are normal. Pupils are equal, round, and reactive to light.  Cardiovascular: Normal rate, regular rhythm, normal heart sounds and intact distal pulses.  Pulmonary/Chest: Effort normal and breath sounds normal. No respiratory distress. He has no wheezes. He has no rales.  Musculoskeletal:  The lower back is normal on exam, not tender and full ROM          Assessment & Plan:  Headache and low back pain of uncertain etiology. Get a LS spine Xray. Get a head CT. Use Ibuprofen prn.  Alysia Penna, MD

## 2017-05-10 ENCOUNTER — Inpatient Hospital Stay: Admission: RE | Admit: 2017-05-10 | Payer: Managed Care, Other (non HMO) | Source: Ambulatory Visit

## 2017-05-12 ENCOUNTER — Telehealth: Payer: Self-pay

## 2017-05-12 ENCOUNTER — Ambulatory Visit (INDEPENDENT_AMBULATORY_CARE_PROVIDER_SITE_OTHER)
Admission: RE | Admit: 2017-05-12 | Discharge: 2017-05-12 | Disposition: A | Discharge status: Home / Self Care | Payer: Managed Care, Other (non HMO) | Source: Ambulatory Visit | Attending: Family Medicine | Admitting: Family Medicine

## 2017-05-12 ENCOUNTER — Ambulatory Visit (AMBULATORY_SURGERY_CENTER): Payer: Self-pay | Admitting: *Deleted

## 2017-05-12 ENCOUNTER — Other Ambulatory Visit: Payer: Self-pay

## 2017-05-12 VITALS — Ht 71.0 in | Wt 209.0 lb

## 2017-05-12 DIAGNOSIS — Z1211 Encounter for screening for malignant neoplasm of colon: Secondary | ICD-10-CM

## 2017-05-12 DIAGNOSIS — G4452 New daily persistent headache (NDPH): Secondary | ICD-10-CM | POA: Diagnosis not present

## 2017-05-12 MED ORDER — NA SULFATE-K SULFATE-MG SULF 17.5-3.13-1.6 GM/177ML PO SOLN: 1.0000 | Freq: Once | ORAL | 0 refills | Status: AC

## 2017-05-12 NOTE — Progress Notes (Signed)
No egg or soy allergy known to patient - allergic to mayo only  No issues with past sedation with any surgeries  or procedures, no intubation problems  No diet pills per patient No home 02 use per patient  No blood thinners per patient  Pt denies issues with constipation  No A fib or A flutter  EMMI video sent to pt's e mail

## 2017-05-12 NOTE — Telephone Encounter (Signed)
Called pt and this number has been disconnected call does NOT go through. Called Charlyn Minerva (spouse [phone]) listed on pts's DPR and was unable to leave a VM   Copied from [place]. Topic: Inquiry >> May 12, 2017  1:44 PM Arletha Grippe wrote: Reason for CRM: pt is wanting to have call about xray. And ct scan Please call [phone]   >> May 12, 2017  1:56 PM Para Skeans A wrote: This is not the right office.

## 2017-05-15 NOTE — Telephone Encounter (Signed)
Called pt and left a VM to call back on my personal cell phone. For some reason when I call the pt from my work desk phone his phone line has been disconnected. Pt might have our number blocked from his phone and he didn't know it.

## 2017-05-16 ENCOUNTER — Telehealth: Payer: Self-pay

## 2017-05-16 DIAGNOSIS — R7989 Other specified abnormal findings of blood chemistry: Secondary | ICD-10-CM

## 2017-05-16 NOTE — Telephone Encounter (Signed)
Pt is calling back stating he has tried calling [facility] personal phone and can not get through but he is available on his cell phone now. [phone].

## 2017-05-16 NOTE — Telephone Encounter (Signed)
The referral was done  

## 2017-05-16 NOTE — Telephone Encounter (Signed)
Called and spoke with pt and went over his lab results. Pt advised and voiced understanding. Mailed another copy of pt's lab results sent last copy sent was very unclear due to [place] having printing problems that have now been addressed.

## 2017-05-16 NOTE — Telephone Encounter (Signed)
Pt is requesting a referral to see an urologist doctor due to his concerns with his Testerone  Sent to PCP .

## 2017-05-17 NOTE — Telephone Encounter (Signed)
Called pt and left a VM as requested to do so by the pt.

## 2017-05-18 ENCOUNTER — Telehealth: Payer: Self-pay

## 2017-05-18 NOTE — Telephone Encounter (Signed)
This has been addressed pt is aware of his lab results and we mailed a copy of lab results and ct scan to pt as requested.   Copied from [place] [id number]. Topic: Inquiry >> May 12, 2017  1:44 PM Arletha Grippe wrote: Reason for CRM: pt is wanting to have call about xray. And ct scan Please call [phone]   >> May 12, 2017  1:56 PM Para Skeans A wrote: This is not the right office.

## 2017-05-19 ENCOUNTER — Encounter: Payer: Self-pay | Admitting: Gastroenterology

## 2017-06-02 ENCOUNTER — Ambulatory Visit (AMBULATORY_SURGERY_CENTER): Payer: Managed Care, Other (non HMO) | Admitting: Gastroenterology

## 2017-06-02 ENCOUNTER — Encounter: Payer: Self-pay | Admitting: Gastroenterology

## 2017-06-02 ENCOUNTER — Other Ambulatory Visit: Payer: Self-pay

## 2017-06-02 VITALS — BP 130/76 | HR 81 | Temp 98.9°F | Resp 20 | Ht 71.0 in | Wt 209.0 lb

## 2017-06-02 DIAGNOSIS — Z1211 Encounter for screening for malignant neoplasm of colon: Secondary | ICD-10-CM | POA: Diagnosis present

## 2017-06-02 DIAGNOSIS — D122 Benign neoplasm of ascending colon: Secondary | ICD-10-CM | POA: Diagnosis not present

## 2017-06-02 HISTORY — PX: COLONOSCOPY: SHX174

## 2017-06-02 MED ORDER — SODIUM CHLORIDE 0.9 % IV SOLN: 500.0000 mL | Freq: Once | INTRAVENOUS | Status: DC

## 2017-06-02 NOTE — Patient Instructions (Signed)
YOU HAD AN ENDOSCOPIC PROCEDURE TODAY AT THE [facility]:   Refer to the procedure report that was given to you for any specific questions about what was found during the examination.  If the procedure report does not answer your questions, please call your gastroenterologist to clarify.  If you requested that your care partner not be given the details of your procedure findings, then the procedure report has been included in a sealed envelope for you to review at your convenience later.  YOU SHOULD EXPECT: Some feelings of bloating in the abdomen. Passage of more gas than usual.  Walking can help get rid of the air that was put into your GI tract during the procedure and reduce the bloating. If you had a lower endoscopy (such as a colonoscopy or flexible sigmoidoscopy) you may notice spotting of blood in your stool or on the toilet paper. If you underwent a bowel prep for your procedure, you may not have a normal bowel movement for a few days.  Please Note:  You might notice some irritation and congestion in your nose or some drainage.  This is from the oxygen used during your procedure.  There is no need for concern and it should clear up in a day or so.  SYMPTOMS TO REPORT IMMEDIATELY:   Following lower endoscopy (colonoscopy or flexible sigmoidoscopy):  Excessive amounts of blood in the stool  Significant tenderness or worsening of abdominal pains  Swelling of the abdomen that is new, acute  Fever of 100F or higher   For urgent or emergent issues, a gastroenterologist can be reached at any hour by calling [phone].   DIET:  We do recommend a small meal at first, but then you may proceed to your regular diet.  Drink plenty of fluids but you should avoid alcoholic beverages for 24 hours.  ACTIVITY:  You should plan to take it easy for the rest of today and you should NOT DRIVE or use heavy machinery until tomorrow (because of the sedation medicines used during the test).     FOLLOW UP: Our staff will call the number listed on your records the next business day following your procedure to check on you and address any questions or concerns that you may have regarding the information given to you following your procedure. If we do not reach you, we will leave a message.  However, if you are feeling well and you are not experiencing any problems, there is no need to return our call.  We will assume that you have returned to your regular daily activities without incident.  If any biopsies were taken you will be contacted by phone or by letter within the next 1-3 weeks.  Please call us at [phone] if you have not heard about the biopsies in 3 weeks.    SIGNATURES/CONFIDENTIALITY: You and/or your care partner have signed paperwork which will be entered into your electronic medical record.  These signatures attest to the fact that that the information above on your After Visit Summary has been reviewed and is understood.  Full responsibility of the confidentiality of this discharge information lies with you and/or your care-partner.  Read all handouts given to you by your recovery room nurse. 

## 2017-06-02 NOTE — Progress Notes (Signed)
Called to room to assist during endoscopic procedure.  Patient ID and intended procedure confirmed with present staff. Received instructions for my participation in the procedure from the performing physician.  

## 2017-06-02 NOTE — Progress Notes (Signed)
Pt's states no medical or surgical changes since previsit or office visit. 

## 2017-06-02 NOTE — Op Note (Signed)
[facility] Patient Name: Jerry Crawford Procedure Date: 06/02/2017 7:54 AM MRN: [id number] Endoscopist: Ladene Artist , MD Age: 57 Referring MD:  Date of Birth: 1961-03-18 Gender: Male Account #: [id number] Procedure:                Colonoscopy Indications:              Screening for colorectal malignant neoplasm Medicines:                Monitored Anesthesia Care Procedure:                Pre-Anesthesia Assessment:                           - Prior to the procedure, a History and Physical                            was performed, and patient medications and                            allergies were reviewed. The patient's tolerance of                            previous anesthesia was also reviewed. The risks                            and benefits of the procedure and the sedation                            options and risks were discussed with the patient.                            All questions were answered, and informed consent                            was obtained. Prior Anticoagulants: The patient has                            taken no previous anticoagulant or antiplatelet                            agents. ASA Grade Assessment: II - A patient with                            mild systemic disease. After reviewing the risks                            and benefits, the patient was deemed in                            satisfactory condition to undergo the procedure.                           After obtaining informed consent, the colonoscope  was passed under direct vision. Throughout the                            procedure, the patient's blood pressure, pulse, and                            oxygen saturations were monitored continuously. The                            Colonoscope was introduced through the anus and                            advanced to the the cecum, identified by                            appendiceal orifice and  ileocecal valve. The                            ileocecal valve, appendiceal orifice, and rectum                            were photographed. The quality of the bowel                            preparation was excellent. The colonoscopy was                            performed without difficulty. The patient tolerated                            the procedure well. Scope In: 8:06:21 AM Scope Out: 8:23:00 AM Scope Withdrawal Time: 0 hours 11 minutes 0 seconds  Total Procedure Duration: 0 hours 16 minutes 39 seconds  Findings:                 The perianal and digital rectal examinations were                            normal.                           A 5 mm polyp was found in the ascending colon. The                            polyp was sessile. The polyp was removed with a                            cold biopsy forceps. Resection and retrieval were                            complete.                           Internal hemorrhoids were found during  retroflexion. The hemorrhoids were medium-sized and                            Grade I (internal hemorrhoids that do not prolapse).                           The exam was otherwise without abnormality on                            direct and retroflexion views. Complications:            No immediate complications. Estimated blood loss:                            None. Estimated Blood Loss:     Estimated blood loss: none. Impression:               - One 5 mm polyp in the ascending colon, removed                            with a cold biopsy forceps. Resected and retrieved.                           - Internal hemorrhoids.                           - The examination was otherwise normal on direct                            and retroflexion views. Recommendation:           - Repeat colonoscopy in 5 years for surveillance if                            polyp is precancerous, otherwise 10 years for                             screening.                           - Patient has a contact number available for                            emergencies. The signs and symptoms of potential                            delayed complications were discussed with the                            patient. Return to normal activities tomorrow.                            Written discharge instructions were provided to the                            patient.                           -  Resume previous diet.                           - Continue present medications.                           - Await pathology results. Ladene Artist, MD 06/02/2017 8:26:13 AM This report has been signed electronically.

## 2017-06-02 NOTE — Progress Notes (Signed)
A and O x3. Report to RN. Tolerated MAC anesthesia well.

## 2017-06-05 ENCOUNTER — Telehealth: Payer: Self-pay

## 2017-06-05 NOTE — Telephone Encounter (Signed)
  Follow up Call-  Call back number 06/02/2017  Post procedure Call Back phone  # [phone]  Permission to leave phone message Yes  Some recent data might be hidden     Patient questions:  Do you have a fever, pain , or abdominal swelling? No. Pain Score  0 *  Have you tolerated food without any problems? Yes.    Have you been able to return to your normal activities? Yes.    Do you have any questions about your discharge instructions: Diet   No. Medications  No. Follow up visit  No.  Do you have questions or concerns about your Care? No.  Actions: * If pain score is 4 or above: No action needed, pain <4.  No problems noted pt.  Pt asked about setting up hemorroid banding appointment.  Pt was given tel number to the 3rd floor. maw

## 2017-06-10 ENCOUNTER — Encounter: Payer: Self-pay | Admitting: Gastroenterology

## 2017-06-21 ENCOUNTER — Telehealth: Payer: Self-pay | Admitting: Family Medicine

## 2017-06-21 NOTE — Telephone Encounter (Signed)
Jerry Crawford was last tested here in 06/22/2016 fax [facility] Jerry's sent lab results plus Crawford results

## 2017-06-21 NOTE — Telephone Encounter (Signed)
Copied from [place] [id number] [id number]. Topic: Referral - Question >> Jun 21, 2017  9:17 AM Hewitt Shorts wrote: Reason for CRM: holly with [facility] did not get the patients testoterone levels and tried to find in epic but they cant see everything available   [phone] ext [phone] for [facility] and fax number [phone]

## 2017-07-04 ENCOUNTER — Encounter (HOSPITAL_COMMUNITY): Payer: Managed Care, Other (non HMO)

## 2017-07-04 ENCOUNTER — Ambulatory Visit: Payer: Managed Care, Other (non HMO) | Admitting: Vascular Surgery

## 2017-07-25 ENCOUNTER — Encounter: Payer: Self-pay | Admitting: Vascular Surgery

## 2017-07-25 ENCOUNTER — Ambulatory Visit: Payer: Managed Care, Other (non HMO) | Admitting: Vascular Surgery

## 2017-07-25 ENCOUNTER — Ambulatory Visit (HOSPITAL_COMMUNITY)
Admission: RE | Admit: 2017-07-25 | Discharge: 2017-07-25 | Disposition: A | Discharge status: Home / Self Care | Payer: Managed Care, Other (non HMO) | Source: Ambulatory Visit | Attending: Vascular Surgery | Admitting: Vascular Surgery

## 2017-07-25 VITALS — BP 128/84 | HR 84 | Temp 97.2°F | Resp 18 | Ht 71.0 in | Wt 210.0 lb

## 2017-07-25 DIAGNOSIS — I83893 Varicose veins of bilateral lower extremities with other complications: Secondary | ICD-10-CM | POA: Diagnosis not present

## 2017-07-25 DIAGNOSIS — I872 Venous insufficiency (chronic) (peripheral): Secondary | ICD-10-CM | POA: Insufficient documentation

## 2017-07-25 DIAGNOSIS — I83891 Varicose veins of right lower extremities with other complications: Secondary | ICD-10-CM

## 2017-07-25 NOTE — Progress Notes (Signed)
Subjective:     Patient ID: Jerry Crawford, male   DOB: 1960/06/10, 57 y.o.   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  He has previously had bilateral laser ablation procedures of the great saphenous veins.  The right great saphenous vein has been shown to be essentially occluded to the distal thigh.  The left great saphenous vein recanalized proximally but occluded distally.  He has developed some large bulging varicosities beginning in the upper third of the left medial thigh extending down to the knee.  These are causing heavy aching throbbing discomfort throughout the day particularly when he is on his feet most of the day.  He is not wearing elastic compression stockings at this point.  He has developed some smaller varicosities in the right distal medial thigh as well.  He has no history of DVT thrombophlebitis stasis ulcers or bleeding.  This is affecting his daily living.  Past Medical History:  Diagnosis Date  . Allergy   . Hypercholesteremia   . Hyperlipidemia   . Hypertension     Social History   Tobacco Use  . Smoking status: Never Smoker  . Smokeless tobacco: Never Used  Substance Use Topics  . Alcohol use: Yes    Comment: Rarely <1/month    Family History  Problem Relation Age of Onset  . Asthma Mother   . Hypertension Mother   . Asthma Sister   . Asthma Brother   . Healthy Father   . Hypertension Sister   . Colon cancer Neg Hx   . Colon polyps Neg Hx   . Rectal cancer Neg Hx   . Stomach cancer Neg Hx   . Esophageal cancer Neg Hx     No Known Allergies   Current Outpatient Medications:  .  amLODipine (NORVASC) 10 MG tablet, Take 1 tablet (10 mg total) by mouth daily., Disp: 30 tablet, Rfl: 11 .  carboxymethylcellulose 1 % ophthalmic solution, 1 drop 3 (three) times daily., Disp: , Rfl:  .  loratadine (CLARITIN) 10 MG tablet, Take 10 mg by mouth  daily., Disp: , Rfl:  .  Multiple Vitamin (MULTIVITAMIN WITH MINERALS) TABS, Take 1 tablet by mouth daily., Disp: , Rfl:  .  Omega-3 Fatty Acids (FISH OIL PO), Take 1 capsule by mouth daily., Disp: , Rfl:  .  PARoxetine (PAXIL) 10 MG tablet, Take 1 tablet (10 mg total) by mouth daily., Disp: 30 tablet, Rfl: 11 .  simvastatin (ZOCOR) 20 MG tablet, Take 1 tablet (20 mg total) by mouth daily., Disp: 30 tablet, Rfl: 11 .  Tetrahydrozoline HCl (EYE DROPS OP), Apply 2 drops to eye as needed (for redness in eyes)., Disp: , Rfl:  .  triamterene-hydrochlorothiazide (MAXZIDE-25) 37.5-25 MG tablet, Take 1 tablet by mouth daily., Disp: 30 tablet, Rfl: 11 .  azelastine (OPTIVAR) 0.05 % ophthalmic solution, Place 1 drop into both eyes 2 (two) times daily. (Patient not taking: Reported on 05/12/2017), Disp: 6 mL, Rfl: 1 .  cyclobenzaprine (FLEXERIL) 10 MG tablet, Take 1 tablet (10 mg total) by mouth 2 (two) times daily as needed for muscle spasms. (Patient not taking: Reported on 06/02/2017), Disp: 20 tablet, Rfl: 0 .  desonide (DESOWEN) 0.05 % lotion, Apply topically 2 (two) times daily. (Patient not taking: Reported on 06/02/2017), Disp: 59 mL, Rfl: 11 .  tadalafil (CIALIS) 20 MG tablet, Take 1 tablet (20 mg total) by mouth daily as  needed for erectile dysfunction. (Patient not taking: Reported on 06/02/2017), Disp: 10 tablet, Rfl: 11  Current Facility-Administered Medications:  .  0.9 %  sodium chloride infusion, 500 mL, Intravenous, Once, Ladene Artist, MD  Vitals:   07/25/17 1255  BP: 128/84  Pulse: 84  Resp: 18  Temp: (!) 97.2 F (36.2 C)  TempSrc: Oral  SpO2: 99%  Weight: 210 lb (95.3 kg)  Height: 5\' 11"  (1.803 m)    Body mass index is 29.29 kg/m.         Review of Systems -Chest pain, dyspnea on exertion, PND, orthopnea, hemoptysis    Objective:   Physical Exam BP 128/84 (BP Location: Left Arm, Patient Position: Sitting, Cuff Size: Normal)   Pulse 84   Temp (!) 97.2 F (36.2 C)  (Oral)   Resp 18   Ht 5\' 11"  (1.803 m)   Wt 210 lb (95.3 kg)   SpO2 99%   BMI 29.29 kg/m   Well-developed well-nourished male no apparent distress alert and oriented x3 Left leg with large bulging varicosities beginning in the upper third of the left medial thigh extending down over the great saphenous system to the knee level.  1+ distal edema noted.  No hyperpigmentation or ulceration noted.  Right leg has small cluster of smaller varicosities in the right distal medial thigh over about an 8 cm in diameter area with no distal edema noted.  Today I ordered a venous duplex exam of the left leg which I reviewed and interpreted.  There is gross reflux in the left great saphenous system extending down to the mid thigh level but the vein is very tortuous after about a 10 cm segment.  There is no DVT.  That performed a bedside SonoSite ultrasound exam.  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    Plan:         #1 long leg elastic compression stockings 20-30 mm gradient #2 elevate legs as much as possible #3 ibuprofen daily on a regular basis for pain #4 return in 3 months-if no significant improvement he will have 2 possible options Option #1 would be to attempt laser ablation of the proximal left great saphenous vein over this 10-15 cm segment and perform multiple stab phlebectomies of painful varicosities in the left thigh which would be my first  choice Option #2 would be to perform ligation of the left great saphenous vein at the saphenofemoral junction in the operating room and remove these painful varicosities This was all discussed with the patient and he will return in 3 months and decision will be made about which option would be preferable He does understand that if another attempt laser ablation is unsuccessful that repair with ligation of the saphenofemoral junction could still be performed at a later date

## 2017-08-23 ENCOUNTER — Ambulatory Visit: Payer: Managed Care, Other (non HMO) | Admitting: Family Medicine

## 2017-08-23 ENCOUNTER — Other Ambulatory Visit: Payer: Self-pay

## 2017-08-23 ENCOUNTER — Encounter: Payer: Self-pay | Admitting: Family Medicine

## 2017-08-23 VITALS — BP 132/78 | HR 78 | Temp 98.0°F | Resp 16 | Ht 71.0 in | Wt 212.8 lb

## 2017-08-23 DIAGNOSIS — J302 Other seasonal allergic rhinitis: Secondary | ICD-10-CM | POA: Diagnosis not present

## 2017-08-23 DIAGNOSIS — N529 Male erectile dysfunction, unspecified: Secondary | ICD-10-CM | POA: Diagnosis not present

## 2017-08-23 LAB — TESTOSTERONE: TESTOSTERONE: 328.16 ng/dL (ref 300.00–890.00)

## 2017-08-23 MED ORDER — AZELASTINE HCL 0.05 % OP SOLN: 1.0000 [drp] | Freq: Two times a day (BID) | OPHTHALMIC | 11 refills | Status: DC

## 2017-08-23 MED ORDER — SILDENAFIL CITRATE 100 MG PO TABS: 100.0000 mg | ORAL_TABLET | Freq: Every day | ORAL | 11 refills | Status: DC | PRN

## 2017-08-23 NOTE — Progress Notes (Signed)
   Subjective:    Patient ID: Jerry Crawford, male    DOB: 06-Aug-1960, 57 y.o.   MRN: [id number]  HPI Here for refills and to ask about erection difficulties. He needs refills of azelastine eye drops for allergies and he would like to try Viagra. Cialis did not help him very much. He wants to check a testosterone level as well. Of note this was on the low end of normal last year.    Review of Systems  Constitutional: Negative.   HENT: Negative.   Eyes: Positive for redness and itching. Negative for photophobia, pain, discharge and visual disturbance.  Respiratory: Negative.   Cardiovascular: Negative.        Objective:   Physical Exam  Constitutional: He appears well-developed and well-nourished.  Eyes: Pupils are equal, round, and reactive to light. Conjunctivae and EOM are normal.  Cardiovascular: Normal rate, regular rhythm, normal heart sounds and intact distal pulses.  Pulmonary/Chest: Effort normal and breath sounds normal.          Assessment & Plan:  For allergies, refill Azelastine drops. For ED, try Viagra 100 mg and check a testosterone level.  Alysia Penna, MD

## 2017-08-28 ENCOUNTER — Encounter: Payer: Self-pay | Admitting: *Deleted

## 2017-09-01 ENCOUNTER — Ambulatory Visit: Payer: Managed Care, Other (non HMO) | Admitting: Family Medicine

## 2017-09-01 ENCOUNTER — Encounter: Payer: Self-pay | Admitting: Family Medicine

## 2017-09-01 VITALS — BP 120/76 | HR 92 | Temp 98.3°F | Ht 71.0 in | Wt 211.0 lb

## 2017-09-01 DIAGNOSIS — L309 Dermatitis, unspecified: Secondary | ICD-10-CM | POA: Diagnosis not present

## 2017-09-01 DIAGNOSIS — N529 Male erectile dysfunction, unspecified: Secondary | ICD-10-CM

## 2017-09-01 MED ORDER — TACROLIMUS 0.1 % EX OINT: TOPICAL_OINTMENT | Freq: Two times a day (BID) | CUTANEOUS | 5 refills | Status: DC

## 2017-09-01 NOTE — Progress Notes (Signed)
   Subjective:    Patient ID: Jerry Crawford, male    DOB: 19-Dec-1960, 57 y.o.   MRN: [id number]  HPI Here for several issues. First he tried Viagra and had mixed results. We checked his testosterone level last time and it is on the low end of normal at 328. He asks about his options to treat ED. Also he has patches of dry irritated skin around the nose and eyes. He has been using OTC cortisone cream and it helps, but he is not comfortable using steroid products long term.    Review of Systems  Constitutional: Negative.   Respiratory: Negative.   Cardiovascular: Negative.   Skin: Positive for rash.       Objective:   Physical Exam  Constitutional: He appears well-developed and well-nourished.  Cardiovascular: Normal rate, regular rhythm, normal heart sounds and intact distal pulses.  Pulmonary/Chest: Effort normal and breath sounds normal.  Skin:  Patches of dry rough skin on the face           Assessment & Plan:  For the ED, we will refer him to Urology to discuss treatment options. For the eczema on the face, try Protopic ointment bid. Alysia Penna, MD

## 2017-09-15 ENCOUNTER — Telehealth: Payer: Self-pay | Admitting: *Deleted

## 2017-09-15 NOTE — Telephone Encounter (Signed)
Please advise 

## 2017-09-15 NOTE — Telephone Encounter (Signed)
Copied from [place] [id number]. Topic: General - Other >> Sep 15, 2017 10:33 AM Yvette Rack wrote: Reason for CRM: pt wants to change theamLODipine (NORVASC) 10 MG tabletand the  triamterene-hydrochlorothiazide (MAXZIDE-25) 37.5-25 MG tablet to bystolic he states that its better to take he would like to be sent to the [facility] [phone] [place], [place]

## 2017-09-18 NOTE — Telephone Encounter (Signed)
Where did this come from? His BP has been well controlled.

## 2017-09-18 NOTE — Telephone Encounter (Signed)
Called pt and was unable to leave a VM to call back.  

## 2017-09-21 NOTE — Telephone Encounter (Signed)
Called pt and unable to leave a VM to call back.  

## 2017-09-27 NOTE — Telephone Encounter (Signed)
Mailed an [facility] letter requesting for the pt to give [place] a call back to discuss why he wants to switch BP medication.

## 2017-10-05 NOTE — Telephone Encounter (Signed)
Pt calling to f/u on Medication change. Wife's doctor suggested a different medication. States that he would like to take only one medication. Cb#[phone]

## 2017-10-06 NOTE — Telephone Encounter (Signed)
Sent to PCP as an FYI  

## 2017-10-09 NOTE — Telephone Encounter (Signed)
I would prefer he stay on the current meds since he is doing so well

## 2017-10-10 NOTE — Telephone Encounter (Signed)
I have called pt and pt's wife unable to leave a VM to call back. I have mailed pt a letter with information advising them NOT to change their BP medication.

## 2017-10-19 ENCOUNTER — Encounter: Payer: Self-pay | Admitting: Vascular Surgery

## 2017-10-19 ENCOUNTER — Other Ambulatory Visit: Payer: Self-pay

## 2017-10-19 ENCOUNTER — Ambulatory Visit (INDEPENDENT_AMBULATORY_CARE_PROVIDER_SITE_OTHER): Payer: Managed Care, Other (non HMO) | Admitting: Vascular Surgery

## 2017-10-19 VITALS — BP 123/82 | HR 78 | Temp 98.7°F | Resp 18 | Ht 71.0 in | Wt 211.2 lb

## 2017-10-19 DIAGNOSIS — I83813 Varicose veins of bilateral lower extremities with pain: Secondary | ICD-10-CM | POA: Diagnosis not present

## 2017-10-19 NOTE — Progress Notes (Signed)
Patient is a 57 year old male who returns for follow-up today for painful varicose veins.  He previously underwent laser ablation of the right greater saphenous vein and left greater saphenous vein with stab avulsions in the left leg.  He had some recanalization of the proximal portion of his left greater saphenous vein and still has some reflux in the saphenofemoral junction on the left side.  He has clusters of varicosities in the left medial thigh which are still painful to him.  He had had prior discussions with Dr. Kellie Simmering about redo laser versus high ligation and stab avulsions in the left thigh.  Additionally he still has several clusters of varicosities adjacent to the right knee which again caused burning and aching in this leg.  The vein was well sealed on the right side.  He denies prior history of DVT.  However, due to his job where he is standing all day these cause him to begin difficulty secondary to pain.  Past Medical History:  Diagnosis Date  . Allergy   . Hypercholesteremia   . Hyperlipidemia   . Hypertension    Social History   Socioeconomic History  . Marital status: Single    Spouse name: Not on file  . Number of children: 4  . Years of education: 84  . Highest education level: Not on file  Occupational History  . Occupation: Glass blower/designer  Social Needs  . Financial resource strain: Not on file  . Food insecurity:    Worry: Not on file    Inability: Not on file  . Transportation needs:    Medical: Not on file    Non-medical: Not on file  Tobacco Use  . Smoking status: Never Smoker  . Smokeless tobacco: Never Used  Substance and Sexual Activity  . Alcohol use: Yes    Comment: Rarely <1/month  . Drug use: No  . Sexual activity: Not on file  Lifestyle  . Physical activity:    Days per week: Not on file    Minutes per session: Not on file  . Stress: Not on file  Relationships  . Social connections:    Talks on phone: Not on file    Gets together: Not on  file    Attends religious service: Not on file    Active member of club or organization: Not on file    Attends meetings of clubs or organizations: Not on file    Relationship status: Not on file  Other Topics Concern  . Not on file  Social History Narrative   ** Merged History Encounter **       Fun/Hobby: Dancing, basketball, baseball, going out every once in a while     Family History  Problem Relation Age of Onset  . Asthma Mother   . Hypertension Mother   . Asthma Sister   . Asthma Brother   . Healthy Father   . Hypertension Sister   . Colon cancer Neg Hx   . Colon polyps Neg Hx   . Rectal cancer Neg Hx   . Stomach cancer Neg Hx   . Esophageal cancer Neg Hx    Current Outpatient Medications on File Prior to Visit  Medication Sig Dispense Refill  . acetaminophen-codeine (TYLENOL #3) 300-30 MG tablet take 1 tablet by mouth every 4 to 6 hours (DO NOT EXCEED 6 TABS IN 24HRS)  0  . amLODipine (NORVASC) 10 MG tablet Take 1 tablet (10 mg total) by mouth daily. 30 tablet 11  .  azelastine (OPTIVAR) 0.05 % ophthalmic solution Place 1 drop into both eyes 2 (two) times daily. 6 mL 11  . carboxymethylcellulose 1 % ophthalmic solution 1 drop 3 (three) times daily.    Marland Kitchen desonide (DESOWEN) 0.05 % lotion Apply topically 2 (two) times daily. 59 mL 11  . IBU 600 MG tablet take 1 tablet by mouth every 8 hours (DO NOT EXCEED 4 TABS IN 24HRS)  0  . loratadine (CLARITIN) 10 MG tablet Take 10 mg by mouth daily.    . Multiple Vitamin (MULTIVITAMIN WITH MINERALS) TABS Take 1 tablet by mouth daily.    . Omega-3 Fatty Acids (FISH OIL PO) Take 1 capsule by mouth daily.    Marland Kitchen PARoxetine (PAXIL) 10 MG tablet Take 1 tablet (10 mg total) by mouth daily. 30 tablet 11  . sildenafil (VIAGRA) 100 MG tablet Take 1 tablet (100 mg total) by mouth daily as needed for erectile dysfunction. 6 tablet 11  . simvastatin (ZOCOR) 20 MG tablet Take 1 tablet (20 mg total) by mouth daily. 30 tablet 11  . tacrolimus  (PROTOPIC) 0.1 % ointment Apply topically 2 (two) times daily. 30 g 5  . Tetrahydrozoline HCl (EYE DROPS OP) Apply 2 drops to eye as needed (for redness in eyes).    . triamterene-hydrochlorothiazide (MAXZIDE-25) 37.5-25 MG tablet Take 1 tablet by mouth daily. 30 tablet 11   Current Facility-Administered Medications on File Prior to Visit  Medication Dose Route Frequency Provider Last Rate Last Dose  . 0.9 %  sodium chloride infusion  500 mL Intravenous Once Ladene Artist, MD       Review of systems: He denies chest pain.  He denies shortness of breath.  Physical exam:  Vitals:   10/19/17 1555  BP: 123/82  Pulse: 78  Resp: 18  Temp: 98.7 F (37.1 C)  TempSrc: Oral  SpO2: 98%  Weight: 211 lb 3.2 oz (95.8 kg)  Height: 5\' 11"  (1.803 m)    Extremities: Left leg with large bulging varicosities on the inner aspect of the thigh with 1+ edema no significant skin changes or ulceration distally  Right leg clusters of varicosities in the right posterior and medial distal thigh over an area of about 8 cm with no distal edema.  Clusters of varicosities range from 5 to 7 mm.  Assessment: Recanalization left greater saphenous with recurrent varicosities left medial thigh.  Discussed with patient today he wishes to proceed with high ligation and stab avulsions of these in the operating room.  He would also like to proceed with stab avulsions of the varicosities on the right medial distal thigh.  We discussed with him getting prior approval of these.  Plan: High ligation left greater saphenous with stab avulsions of left medial thigh varicosities and hopefully simultaneous distal right medial thigh stab avulsions.  Procedure details risk benefits possible complications were discussed with the patient today he understands and agrees to proceed.  Ruta Hinds, MD [facility] Office: [phone] Pager: [phone]

## 2017-11-15 ENCOUNTER — Ambulatory Visit: Payer: Managed Care, Other (non HMO) | Admitting: Family Medicine

## 2017-11-15 DIAGNOSIS — Z0289 Encounter for other administrative examinations: Secondary | ICD-10-CM

## 2017-11-21 ENCOUNTER — Encounter (HOSPITAL_COMMUNITY): Admission: RE | Payer: Self-pay | Source: Ambulatory Visit

## 2017-11-21 ENCOUNTER — Ambulatory Visit (HOSPITAL_COMMUNITY)
Admission: RE | Admit: 2017-11-21 | Payer: Managed Care, Other (non HMO) | Source: Ambulatory Visit | Admitting: Vascular Surgery

## 2017-11-21 SURGERY — LIGATION AND STRIPPING, VARICOSE VEIN
Anesthesia: General | Laterality: Bilateral

## 2017-12-01 ENCOUNTER — Ambulatory Visit: Payer: Managed Care, Other (non HMO) | Admitting: Family Medicine

## 2017-12-01 ENCOUNTER — Encounter: Payer: Self-pay | Admitting: Family Medicine

## 2017-12-01 VITALS — BP 118/80 | HR 76 | Temp 97.8°F | Ht 71.0 in | Wt 211.2 lb

## 2017-12-01 DIAGNOSIS — L309 Dermatitis, unspecified: Secondary | ICD-10-CM

## 2017-12-01 MED ORDER — TRIAMCINOLONE ACETONIDE 0.1 % EX CREA: 1.0000 "application " | TOPICAL_CREAM | Freq: Two times a day (BID) | CUTANEOUS | 2 refills | Status: DC

## 2017-12-01 NOTE — Progress Notes (Signed)
   Subjective:    Patient ID: Jerry Crawford, male    DOB: 04-08-1961, 57 y.o.   MRN: [id number]  HPI Here for 5 days of areas of itchy skin on both sides of the neck. Using OTC cortisone cream with partial relief. No recent change in soaps, shampoos, etc.    Review of Systems  Constitutional: Negative.   Respiratory: Negative.   Cardiovascular: Negative.   Skin: Positive for rash.       Objective:   Physical Exam  Constitutional: He appears well-developed and well-nourished.  Cardiovascular: Normal rate, regular rhythm, normal heart sounds and intact distal pulses.  Pulmonary/Chest: Effort normal and breath sounds normal.  Skin:  Areas of maculopapular erythema on both lateral neck areas           Assessment & Plan:  Eczema, treat with Triamcinolone cream. Alysia Penna, MD

## 2017-12-26 ENCOUNTER — Telehealth: Payer: Self-pay | Admitting: *Deleted

## 2017-12-26 NOTE — Telephone Encounter (Signed)
Left detailed voice message with Mr. Voit reminding him to call Gomez Cleverly RN/[facility] surgery scheduler to set up ligation and stripping surgery (out-patient surgery at [facility] with Dr. Ruta Hinds).

## 2018-02-22 ENCOUNTER — Telehealth: Payer: Self-pay | Admitting: *Deleted

## 2018-02-22 NOTE — Telephone Encounter (Signed)
Returning Mr. Lawry telephone message regarding scheduling ligation and stripping procedure at [facility]. Reminded Mr. Inskeep that the dates of service for [employer] precertification for the ligation and stripping surgery ends on 05-03-2018 and that surgery would need to be completed by that date. Advised that he talk with his employer to determine when he could take off from work and plan surgery and call Gomez Cleverly RN at [facility] to schedule this surgery.  Mr. Hockey verbalized understanding.

## 2018-03-16 ENCOUNTER — Other Ambulatory Visit: Payer: Self-pay | Admitting: Family Medicine

## 2018-03-23 ENCOUNTER — Other Ambulatory Visit: Payer: Self-pay | Admitting: Family Medicine

## 2018-03-25 ENCOUNTER — Other Ambulatory Visit: Payer: Self-pay | Admitting: Family Medicine

## 2018-04-19 ENCOUNTER — Other Ambulatory Visit: Payer: Self-pay | Admitting: Family Medicine

## 2018-05-18 ENCOUNTER — Telehealth: Payer: Self-pay | Admitting: *Deleted

## 2018-05-18 DIAGNOSIS — L309 Dermatitis, unspecified: Secondary | ICD-10-CM

## 2018-05-18 NOTE — Telephone Encounter (Signed)
Pt would like a referral for dermatology for dry skin.  Dr. Sarajane Jews please advise. Thanks

## 2018-05-18 NOTE — Telephone Encounter (Signed)
Copied from [place] [id number]. Topic: Referral - Request for Referral >> May 18, 2018  3:42 PM Yvette Rack wrote: Has patient seen PCP for this complaint? yes  *If NO, is insurance requiring patient see PCP for this issue before PCP can refer them? Referral for which specialty: Dermatology Preferred provider/office: no specific/[place] Reason for referral: Dry skin

## 2018-05-21 NOTE — Telephone Encounter (Signed)
The referral was done  

## 2018-06-01 ENCOUNTER — Ambulatory Visit (INDEPENDENT_AMBULATORY_CARE_PROVIDER_SITE_OTHER): Payer: 59 | Admitting: Family Medicine

## 2018-06-01 ENCOUNTER — Encounter: Payer: Self-pay | Admitting: Family Medicine

## 2018-06-01 VITALS — BP 122/76 | HR 89 | Temp 97.9°F | Ht 70.5 in | Wt 210.2 lb

## 2018-06-01 DIAGNOSIS — Z23 Encounter for immunization: Secondary | ICD-10-CM

## 2018-06-01 DIAGNOSIS — Z0000 Encounter for general adult medical examination without abnormal findings: Secondary | ICD-10-CM

## 2018-06-01 MED ORDER — AMLODIPINE BESYLATE 10 MG PO TABS: 10.0000 mg | ORAL_TABLET | Freq: Every day | ORAL | 11 refills | Status: DC

## 2018-06-01 MED ORDER — TRIAMTERENE-HCTZ 37.5-25 MG PO TABS: 1.0000 | ORAL_TABLET | Freq: Every day | ORAL | 11 refills | Status: DC

## 2018-06-01 NOTE — Progress Notes (Signed)
   Subjective:    Patient ID: Jerry Crawford, male    DOB: 1960-10-04, 58 y.o.   MRN: [id number]  HPI Here for a well exam. He is doing well. He plans to see the [facility] again soon because he may require a vein stripping in the left leg. His eye doctor recently said he has early signs of glaucoma, but he does not require treatment yet.    Review of Systems  Constitutional: Negative.   HENT: Negative.   Eyes: Negative.   Respiratory: Negative.   Cardiovascular: Negative.   Gastrointestinal: Negative.   Genitourinary: Negative.   Musculoskeletal: Negative.   Skin: Negative.   Neurological: Negative.   Psychiatric/Behavioral: Negative.        Objective:   Physical Exam Constitutional:      General: He is not in acute distress.    Appearance: He is well-developed. He is not diaphoretic.  HENT:     Head: Normocephalic and atraumatic.     Right Ear: External ear normal.     Left Ear: External ear normal.     Nose: Nose normal.     Mouth/Throat:     Pharynx: No oropharyngeal exudate.  Eyes:     General: No scleral icterus.       Right eye: No discharge.        Left eye: No discharge.     Conjunctiva/sclera: Conjunctivae normal.     Pupils: Pupils are equal, round, and reactive to light.  Neck:     Musculoskeletal: Neck supple.     Thyroid: No thyromegaly.     Vascular: No JVD.     Trachea: No tracheal deviation.  Cardiovascular:     Rate and Rhythm: Normal rate and regular rhythm.     Heart sounds: Normal heart sounds. No murmur. No friction rub. No gallop.   Pulmonary:     Effort: Pulmonary effort is normal. No respiratory distress.     Breath sounds: Normal breath sounds. No wheezing or rales.  Chest:     Chest wall: No tenderness.  Abdominal:     General: Bowel sounds are normal. There is no distension.     Palpations: Abdomen is soft. There is no mass.     Tenderness: There is no abdominal tenderness. There is no guarding or rebound.  Genitourinary:  Penis: Normal. No tenderness.      Prostate: Normal.     Rectum: Normal. Guaiac result negative.  Musculoskeletal: Normal range of motion.        General: No tenderness.  Lymphadenopathy:     Cervical: No cervical adenopathy.  Skin:    General: Skin is warm and dry.     Coloration: Skin is not pale.     Findings: No erythema or rash.  Neurological:     Mental Status: He is alert and oriented to person, place, and time.     Cranial Nerves: No cranial nerve deficit.     Motor: No abnormal muscle tone.     Coordination: Coordination normal.     Deep Tendon Reflexes: Reflexes are normal and symmetric. Reflexes normal.  Psychiatric:        Behavior: Behavior normal.        Thought Content: Thought content normal.        Judgment: Judgment normal.           Assessment & Plan:  Well exam. We discussed diet and exercise. Get fasting labs soon. Alysia Penna, MD

## 2018-06-05 ENCOUNTER — Other Ambulatory Visit (INDEPENDENT_AMBULATORY_CARE_PROVIDER_SITE_OTHER): Payer: 59

## 2018-06-05 DIAGNOSIS — Z0000 Encounter for general adult medical examination without abnormal findings: Secondary | ICD-10-CM | POA: Diagnosis not present

## 2018-06-05 LAB — BASIC METABOLIC PANEL
BUN: 18 mg/dL (ref 6–23)
CO2: 32 mEq/L (ref 19–32)
Calcium: 9.5 mg/dL (ref 8.4–10.5)
Chloride: 100 mEq/L (ref 96–112)
Creatinine, Ser: 0.95 mg/dL (ref 0.40–1.50)
GFR: 99.38 mL/min (ref >60.00)
GLUCOSE: 98 mg/dL (ref 70–99)
Potassium: 3.7 mEq/L (ref 3.5–5.1)
Sodium: 139 mEq/L (ref 135–145)

## 2018-06-05 LAB — CBC WITH DIFFERENTIAL/PLATELET
BASOS PCT: 0.3 % (ref 0.0–3.0)
Basophils Absolute: 0 10*3/uL (ref 0.0–0.1)
Eosinophils Absolute: 0.1 10*3/uL (ref 0.0–0.7)
Eosinophils Relative: 3.8 % (ref 0.0–5.0)
HEMATOCRIT: 47.5 % (ref 39.0–52.0)
Hemoglobin: 16.1 g/dL (ref 13.0–17.0)
Lymphocytes Relative: 42.5 % (ref 12.0–46.0)
Lymphs Abs: 1.4 10*3/uL (ref 0.7–4.0)
MCHC: 33.9 g/dL (ref 30.0–36.0)
MCV: 88.3 fl (ref 78.0–100.0)
MONOS PCT: 12.2 % — AB (ref 3.0–12.0)
Monocytes Absolute: 0.4 10*3/uL (ref 0.1–1.0)
Neutro Abs: 1.4 10*3/uL (ref 1.4–7.7)
Neutrophils Relative %: 41.2 % — ABNORMAL LOW (ref 43.0–77.0)
Platelets: 228 10*3/uL (ref 150.0–400.0)
RBC: 5.38 Mil/uL (ref 4.22–5.81)
RDW: 13.4 % (ref 11.5–15.5)
WBC: 3.3 10*3/uL — ABNORMAL LOW (ref 4.0–10.5)

## 2018-06-05 LAB — POC URINALSYSI DIPSTICK (AUTOMATED)
BILIRUBIN UA: NEGATIVE
Blood, UA: NEGATIVE
Glucose, UA: NEGATIVE
KETONES UA: NEGATIVE
Leukocytes, UA: NEGATIVE
Nitrite, UA: NEGATIVE
Protein, UA: NEGATIVE
Spec Grav, UA: 1.015 (ref 1.010–1.025)
Urobilinogen, UA: 0.2 E.U./dL
pH, UA: 7.5 (ref 5.0–8.0)

## 2018-06-05 LAB — TSH: TSH: 1.53 u[IU]/mL (ref 0.35–4.50)

## 2018-06-05 LAB — HEPATIC FUNCTION PANEL
ALT: 26 U/L (ref 0–53)
AST: 21 U/L (ref 0–37)
Albumin: 4.7 g/dL (ref 3.5–5.2)
Alkaline Phosphatase: 50 U/L (ref 39–117)
Bilirubin, Direct: 0.2 mg/dL (ref 0.0–0.3)
TOTAL PROTEIN: 6.9 g/dL (ref 6.0–8.3)
Total Bilirubin: 1.2 mg/dL (ref 0.2–1.2)

## 2018-06-05 LAB — PSA: PSA: 0.48 ng/mL (ref 0.10–4.00)

## 2018-06-05 LAB — LIPID PANEL
Cholesterol: 137 mg/dL (ref 0–200)
HDL: 45.1 mg/dL (ref >39.00)
LDL Cholesterol: 75 mg/dL (ref 0–99)
NONHDL: 92.31
Total CHOL/HDL Ratio: 3
Triglycerides: 89 mg/dL (ref 0.0–149.0)
VLDL: 17.8 mg/dL (ref 0.0–40.0)

## 2018-06-08 ENCOUNTER — Ambulatory Visit: Payer: Self-pay

## 2018-06-08 MED ORDER — TRIAMCINOLONE ACETONIDE 0.1 % EX CREA: 1.0000 "application " | TOPICAL_CREAM | Freq: Two times a day (BID) | CUTANEOUS | 2 refills | Status: AC

## 2018-06-08 NOTE — Addendum Note (Signed)
Addended by: Elie Confer on: 06/08/2018 02:27 PM   Modules accepted: Orders

## 2018-06-08 NOTE — Telephone Encounter (Signed)
Patient called and says when he was in the office to see Dr. Sarajane Jews on 06/01/18, he showed Dr. Sarajane Jews a rash on his abdomen. He says the rash itches off and on. He asking if Dr. Sarajane Jews can call something called in for the rash/itching until he can get in to see the dermatologist. I advised I will send the request to Dr. Sarajane Jews and someone from the office will call with his recommendation, patient verbalized understanding.    Reason for Disposition . Caller requesting a NON-URGENT new prescription or refill and triager unable to refill per unit policy  Protocols used: MEDICATION QUESTION CALL-A-AH

## 2018-06-08 NOTE — Telephone Encounter (Signed)
Please advise 

## 2018-06-08 NOTE — Telephone Encounter (Signed)
Call in Triamcinolone cream 0.1 % to apply bid prn, 45 grams with 2 rf

## 2018-06-08 NOTE — Telephone Encounter (Signed)
Called and spoke with pt and he is aware of rx that has been sent to the pharmacy.  

## 2018-06-15 ENCOUNTER — Telehealth: Payer: Self-pay | Admitting: *Deleted

## 2018-06-15 NOTE — Telephone Encounter (Signed)
Called and spoke with pt and he is aware of lab results and he stated that the cream that Dr. Sarajane Jews gave him is working very well.

## 2018-06-15 NOTE — Telephone Encounter (Signed)
Copied from [place] [id number]. Topic: General - Inquiry >> Jun 15, 2018  7:53 AM Rayann Heman wrote: Pt called back for lab results

## 2018-09-07 ENCOUNTER — Other Ambulatory Visit: Payer: Self-pay | Admitting: Family Medicine

## 2018-12-25 ENCOUNTER — Other Ambulatory Visit: Payer: Self-pay | Admitting: Family Medicine

## 2019-01-05 ENCOUNTER — Other Ambulatory Visit: Payer: Self-pay | Admitting: Family Medicine

## 2019-03-21 ENCOUNTER — Other Ambulatory Visit: Payer: Self-pay | Admitting: Family Medicine

## 2019-06-08 ENCOUNTER — Other Ambulatory Visit: Payer: Self-pay | Admitting: Family Medicine

## 2019-06-13 ENCOUNTER — Other Ambulatory Visit: Payer: Self-pay

## 2019-06-14 ENCOUNTER — Encounter: Payer: Managed Care, Other (non HMO) | Admitting: Family Medicine

## 2019-06-19 ENCOUNTER — Other Ambulatory Visit: Payer: Self-pay | Admitting: Family Medicine

## 2019-06-24 ENCOUNTER — Ambulatory Visit (INDEPENDENT_AMBULATORY_CARE_PROVIDER_SITE_OTHER): Payer: Managed Care, Other (non HMO) | Admitting: Family Medicine

## 2019-06-24 ENCOUNTER — Encounter: Payer: Self-pay | Admitting: Family Medicine

## 2019-06-24 ENCOUNTER — Other Ambulatory Visit: Payer: Self-pay

## 2019-06-24 VITALS — BP 120/84 | HR 89 | Temp 97.6°F | Ht 70.5 in | Wt 215.8 lb

## 2019-06-24 DIAGNOSIS — J302 Other seasonal allergic rhinitis: Secondary | ICD-10-CM

## 2019-06-24 DIAGNOSIS — Z0000 Encounter for general adult medical examination without abnormal findings: Secondary | ICD-10-CM | POA: Diagnosis not present

## 2019-06-24 LAB — CBC WITH DIFFERENTIAL/PLATELET
Basophils Absolute: 0 10*3/uL (ref 0.0–0.1)
Basophils Relative: 0.8 % (ref 0.0–3.0)
Eosinophils Absolute: 0.1 10*3/uL (ref 0.0–0.7)
Eosinophils Relative: 1.7 % (ref 0.0–5.0)
HCT: 47.4 % (ref 39.0–52.0)
Hemoglobin: 16.2 g/dL (ref 13.0–17.0)
Lymphocytes Relative: 46.4 % — ABNORMAL HIGH (ref 12.0–46.0)
Lymphs Abs: 1.8 10*3/uL (ref 0.7–4.0)
MCHC: 34.2 g/dL (ref 30.0–36.0)
MCV: 88.5 fl (ref 78.0–100.0)
Monocytes Absolute: 0.4 10*3/uL (ref 0.1–1.0)
Monocytes Relative: 10 % (ref 3.0–12.0)
Neutro Abs: 1.6 10*3/uL (ref 1.4–7.7)
Neutrophils Relative %: 41.1 % — ABNORMAL LOW (ref 43.0–77.0)
Platelets: 226 10*3/uL (ref 150.0–400.0)
RBC: 5.36 Mil/uL (ref 4.22–5.81)
RDW: 13.4 % (ref 11.5–15.5)
WBC: 3.9 10*3/uL — ABNORMAL LOW (ref 4.0–10.5)

## 2019-06-24 LAB — BASIC METABOLIC PANEL
BUN: 13 mg/dL (ref 6–23)
CO2: 33 mEq/L — ABNORMAL HIGH (ref 19–32)
Calcium: 9.9 mg/dL (ref 8.4–10.5)
Chloride: 100 mEq/L (ref 96–112)
Creatinine, Ser: 0.9 mg/dL (ref 0.40–1.50)
GFR: 105.38 mL/min (ref >60.00)
Glucose, Bld: 103 mg/dL — ABNORMAL HIGH (ref 70–99)
Potassium: 3.7 mEq/L (ref 3.5–5.1)
Sodium: 138 mEq/L (ref 135–145)

## 2019-06-24 LAB — LIPID PANEL
Cholesterol: 132 mg/dL (ref 0–200)
HDL: 46.6 mg/dL (ref >39.00)
LDL Cholesterol: 61 mg/dL (ref 0–99)
NonHDL: 85.12
Total CHOL/HDL Ratio: 3
Triglycerides: 123 mg/dL (ref 0.0–149.0)
VLDL: 24.6 mg/dL (ref 0.0–40.0)

## 2019-06-24 LAB — HEPATIC FUNCTION PANEL
ALT: 25 U/L (ref 0–53)
AST: 20 U/L (ref 0–37)
Albumin: 4.5 g/dL (ref 3.5–5.2)
Alkaline Phosphatase: 51 U/L (ref 39–117)
Bilirubin, Direct: 0.3 mg/dL (ref 0.0–0.3)
Total Bilirubin: 1.5 mg/dL — ABNORMAL HIGH (ref 0.2–1.2)
Total Protein: 7.1 g/dL (ref 6.0–8.3)

## 2019-06-24 LAB — PSA: PSA: 0.62 ng/mL (ref 0.10–4.00)

## 2019-06-24 LAB — TSH: TSH: 1.77 u[IU]/mL (ref 0.35–4.50)

## 2019-06-24 MED ORDER — SIMVASTATIN 20 MG PO TABS: 20.0000 mg | ORAL_TABLET | Freq: Every day | ORAL | 11 refills | Status: DC

## 2019-06-24 MED ORDER — TADALAFIL 20 MG PO TABS: 20.0000 mg | ORAL_TABLET | Freq: Every day | ORAL | 11 refills | Status: DC | PRN

## 2019-06-24 MED ORDER — AZELASTINE HCL 0.05 % OP SOLN: 1.0000 [drp] | Freq: Two times a day (BID) | OPHTHALMIC | 11 refills | Status: DC

## 2019-06-24 MED ORDER — AMLODIPINE BESYLATE 10 MG PO TABS: 10.0000 mg | ORAL_TABLET | Freq: Every day | ORAL | 11 refills | Status: DC

## 2019-06-24 NOTE — Progress Notes (Signed)
   Subjective:    Patient ID: Jerry Crawford, male    DOB: October 13, 1960, 59 y.o.   MRN: [id number]  HPI Duplicate   Review of Systems     Objective:   Physical Exam        Assessment & Plan:

## 2019-06-24 NOTE — Progress Notes (Signed)
Subjective:    Patient ID: Jerry Crawford, male    DOB: 1960-07-23, 59 y.o.   MRN: [id number]  HPI Here for a well exam. He feels fine but he notes that Viagra did not help his ED very much. His moods have been excellent and he does not think he needs to be on Paxil any more.    Review of Systems  Constitutional: Negative.   HENT: Negative.   Eyes: Negative.   Respiratory: Negative.   Cardiovascular: Negative.   Gastrointestinal: Negative.   Genitourinary: Negative.   Musculoskeletal: Negative.   Skin: Negative.   Neurological: Negative.   Psychiatric/Behavioral: Negative.        Objective:   Physical Exam Constitutional:      General: He is not in acute distress.    Appearance: He is well-developed. He is not diaphoretic.  HENT:     Head: Normocephalic and atraumatic.     Right Ear: External ear normal.     Left Ear: External ear normal.     Nose: Nose normal.     Mouth/Throat:     Pharynx: No oropharyngeal exudate.  Eyes:     General: No scleral icterus.       Right eye: No discharge.        Left eye: No discharge.     Conjunctiva/sclera: Conjunctivae normal.     Pupils: Pupils are equal, round, and reactive to light.  Neck:     Thyroid: No thyromegaly.     Vascular: No JVD.     Trachea: No tracheal deviation.  Cardiovascular:     Rate and Rhythm: Normal rate and regular rhythm.     Heart sounds: Normal heart sounds. No murmur. No friction rub. No gallop.   Pulmonary:     Effort: Pulmonary effort is normal. No respiratory distress.     Breath sounds: Normal breath sounds. No wheezing or rales.  Chest:     Chest wall: No tenderness.  Abdominal:     General: Bowel sounds are normal. There is no distension.     Palpations: Abdomen is soft. There is no mass.     Tenderness: There is no abdominal tenderness. There is no guarding or rebound.  Genitourinary:    Penis: Normal. No tenderness.      Testes: Normal.     Prostate: Normal.     Rectum: Normal.  Guaiac result negative.  Musculoskeletal:        General: No tenderness. Normal range of motion.     Cervical back: Neck supple.  Lymphadenopathy:     Cervical: No cervical adenopathy.  Skin:    General: Skin is warm and dry.     Coloration: Skin is not pale.     Findings: No erythema or rash.  Neurological:     Mental Status: He is alert and oriented to person, place, and time.     Cranial Nerves: No cranial nerve deficit.     Motor: No abnormal muscle tone.     Coordination: Coordination normal.     Deep Tendon Reflexes: Reflexes are normal and symmetric. Reflexes normal.  Psychiatric:        Behavior: Behavior normal.        Thought Content: Thought content normal.        Judgment: Judgment normal.           Assessment & Plan:  Well exam. We discussed diet and exercise. Get fasting labs. He will try Cialis for the ED. He  will taper off Paxil by taking one pill every other day for 2 weeks and then stopping.  Alysia Penna, MD

## 2019-07-05 ENCOUNTER — Ambulatory Visit: Payer: 59 | Admitting: Family Medicine

## 2019-07-08 ENCOUNTER — Encounter: Payer: Self-pay | Admitting: Family Medicine

## 2019-07-08 ENCOUNTER — Other Ambulatory Visit: Payer: Self-pay

## 2019-07-08 ENCOUNTER — Ambulatory Visit (INDEPENDENT_AMBULATORY_CARE_PROVIDER_SITE_OTHER): Payer: 59 | Admitting: Family Medicine

## 2019-07-08 VITALS — BP 120/70 | HR 90 | Temp 97.9°F | Wt 217.2 lb

## 2019-07-08 DIAGNOSIS — R519 Headache, unspecified: Secondary | ICD-10-CM | POA: Diagnosis not present

## 2019-07-08 DIAGNOSIS — M79604 Pain in right leg: Secondary | ICD-10-CM

## 2019-07-08 NOTE — Progress Notes (Signed)
   Subjective:    Patient ID: Jerry Crawford, male    DOB: July 05, 1960, 59 y.o.   MRN: [id number]  HPI Here for 2 issues. First he tried  20 mg Cialis twice over the past weekend. Shortly after taking each of them he developed a severe throbbing headache that last ed about 4 hours and then went away. No other symptoms. For the past 2 days he has had no headache at all. He has taken Viagra in the past and had no side effects. Also for the past week he has had tightness and pain in the back of the right thigh. No recent trauma, but he is very active on his job. No back pain, no numbness or weakness in the leg. Ibuprofen helps a little.    Review of Systems  Constitutional: Negative.   HENT: Negative.   Eyes: Negative.   Respiratory: Negative.   Cardiovascular: Negative.   Musculoskeletal: Positive for myalgias. Negative for back pain.  Neurological: Positive for headaches. Negative for dizziness, tremors, seizures, syncope, facial asymmetry, speech difficulty, weakness, light-headedness and numbness.       Objective:   Physical Exam Constitutional:      General: He is not in acute distress.    Appearance: Normal appearance. He is well-developed.  HENT:     Head: Normocephalic and atraumatic.     Right Ear: Tympanic membrane, ear canal and external ear normal.     Left Ear: Tympanic membrane, ear canal and external ear normal.     Nose: Nose normal.     Mouth/Throat:     Pharynx: Oropharynx is clear.  Eyes:     Conjunctiva/sclera: Conjunctivae normal.  Cardiovascular:     Rate and Rhythm: Normal rate and regular rhythm.     Pulses: Normal pulses.     Heart sounds: Normal heart sounds.  Pulmonary:     Effort: Pulmonary effort is normal.     Breath sounds: Normal breath sounds.  Musculoskeletal:     Cervical back: Normal range of motion.     Comments: Lower back is not tender and has full ROM. He is tender in the posterior right thigh and the hamstring is tight     Lymphadenopathy:     Cervical: No cervical adenopathy.  Neurological:     General: No focal deficit present.     Mental Status: He is alert and oriented to person, place, and time.     Cranial Nerves: No cranial nerve deficit.     Motor: No weakness.     Coordination: Coordination normal.     Gait: Gait normal.           Assessment & Plan:  His headaches were side effects of the Cialis, so I suggested he try taking 1/2 tablet (10 mg) next time. The leg pain is due to a tight hamstring so we will send him to PT to treat this.  Alysia Penna, MD

## 2019-07-11 ENCOUNTER — Ambulatory Visit: Payer: 59 | Admitting: Physical Therapy

## 2019-07-25 ENCOUNTER — Ambulatory Visit: Payer: 59 | Attending: Family Medicine | Admitting: Physical Therapy

## 2019-07-25 ENCOUNTER — Other Ambulatory Visit: Payer: Self-pay

## 2019-07-25 ENCOUNTER — Encounter: Payer: Self-pay | Admitting: Physical Therapy

## 2019-07-25 DIAGNOSIS — M6281 Muscle weakness (generalized): Secondary | ICD-10-CM | POA: Diagnosis present

## 2019-07-25 DIAGNOSIS — M79651 Pain in right thigh: Secondary | ICD-10-CM | POA: Diagnosis present

## 2019-07-25 NOTE — Patient Instructions (Signed)
Access Code: [email or web address] URL: [email or web address] Date: 07/25/2019 Prepared by: Ruben Im  Exercises Prone Press Up - 1 x daily - 7 x weekly - 1 sets - 10 reps Standing Lumbar Extension - 1 x daily - 7 x weekly - 1 sets - 10 reps Hip Flexor Stretch at Edge of Bed - 1 x daily - 7 x weekly - 1 sets - 3 reps - 20 hold Hip Flexor Stretch with Chair - 1 x daily - 7 x weekly - 1 sets - 3 reps - 20 hold Supine Hamstring Stretch with Strap - 1 x daily - 7 x weekly - 1 sets - 3 reps - 20 hold
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
## 2019-08-01 ENCOUNTER — Other Ambulatory Visit: Payer: Self-pay

## 2019-08-01 ENCOUNTER — Telehealth: Payer: Self-pay | Admitting: Family Medicine

## 2019-08-01 NOTE — Telephone Encounter (Signed)
Left message for patient to call back and schedule appointment.

## 2019-08-01 NOTE — Telephone Encounter (Signed)
PAtient called wanting to make an appointment for a sticky chest pain in upper right side of chest near his shoulder.  I transferred the patient to nurse triage.

## 2019-08-02 ENCOUNTER — Ambulatory Visit (INDEPENDENT_AMBULATORY_CARE_PROVIDER_SITE_OTHER): Payer: 59 | Admitting: Adult Health

## 2019-08-02 ENCOUNTER — Encounter: Payer: Self-pay | Admitting: Adult Health

## 2019-08-02 VITALS — BP 122/82 | HR 84 | Temp 98.7°F | Wt 217.0 lb

## 2019-08-02 DIAGNOSIS — M898X1 Other specified disorders of bone, shoulder: Secondary | ICD-10-CM | POA: Diagnosis not present

## 2019-08-02 NOTE — Progress Notes (Signed)
Subjective:    Patient ID: Jerry Crawford, male    DOB: [birth year], 59 y.o.   MRN: [id number]  HPI 59 year old male who  has a past medical history of Allergy, Hypercholesteremia, Hyperlipidemia, and Hypertension.  He is a patient of Dr. Sarajane Jews who I am seeing today for an acute issue. He reports that over the last two days he has been experiencing intermittent left shoulder/upper chest pain.  Pain is very mild and is described as "pinprick".  Pain is worse when taking a deep breath and with certain motion.  He denies shortness of breath, chest pain, nausea, vomiting, fatigue, radiating pain down his left arm or up his right jaw.  Does work a physically demanding job  He has not used anything over-the-counter to help with his pain   Review of Systems See HPI   Past Medical History:  Diagnosis Date  . Allergy   . Hypercholesteremia   . Hyperlipidemia   . Hypertension     Social History   Socioeconomic History  . Marital status: Single    Spouse name: Not on file  . Number of children: 4  . Years of education: 70  . Highest education level: Not on file  Occupational History  . Occupation: Glass blower/designer  Tobacco Use  . Smoking status: Never Smoker  . Smokeless tobacco: Never Used  Substance and Sexual Activity  . Alcohol use: Yes    Comment: Rarely <1/month  . Drug use: No  . Sexual activity: Not on file  Other Topics Concern  . Not on file  Social History Narrative   ** Merged History Encounter **       Fun/Hobby: Dancing, basketball, baseball, going out every once in a while    Social Determinants of Radio broadcast assistant Strain:   . Difficulty of Paying Living Expenses:   Food Insecurity:   . Worried About Charity fundraiser in the Last Year:   . Arboriculturist in the Last Year:   Transportation Needs:   . Film/video editor (Medical):   Marland Kitchen Lack of Transportation (Non-Medical):   Physical Activity:   . Days of Exercise per Week:   .  Minutes of Exercise per Session:   Stress:   . Feeling of Stress :   Social Connections:   . Frequency of Communication with Friends and Family:   . Frequency of Social Gatherings with Friends and Family:   . Attends Religious Services:   . Active Member of Clubs or Organizations:   . Attends Archivist Meetings:   Marland Kitchen Marital Status:   Intimate Partner Violence:   . Fear of Current or Ex-Partner:   . Emotionally Abused:   Marland Kitchen Physically Abused:   . Sexually Abused:     Past Surgical History:  Procedure Laterality Date  . COLONOSCOPY  06/02/2017   per Dr. Fuller Plan, adenomatous polyp, repeat in 5 yrs   . ENDOVENOUS ABLATION SAPHENOUS VEIN W/ LASER Left 11/08/2016   endovenous laser ablation left small saphenous vein by Tinnie Gens MD   . ENDOVENOUS [place] W/ LASER Left 11/23/2016   endovenous laser ablation left greater saphenous vein and stab phlebectomy > 20 incisions left leg by Tinnie Gens MD  . ENDOVENOUS ABLATION SAPHENOUS VEIN W/ LASER Right 12/26/2016   endovenous laser ablation R greater saphenous vein and stab phlebectomy > 20 incisions R leg by Tinnie Gens MD     Family History  Problem  Relation Age of Onset  . Asthma Mother   . Hypertension Mother   . Asthma Sister   . Asthma Brother   . Healthy Father   . Hypertension Sister   . Colon cancer Neg Hx   . Colon polyps Neg Hx   . Rectal cancer Neg Hx   . Stomach cancer Neg Hx   . Esophageal cancer Neg Hx     No Known Allergies  Current Outpatient Medications on File Prior to Visit  Medication Sig Dispense Refill  . amLODipine (NORVASC) 10 MG tablet Take 1 tablet (10 mg total) by mouth daily. 30 tablet 11  . azelastine (OPTIVAR) 0.05 % ophthalmic solution Place 1 drop into both eyes 2 (two) times daily. 6 mL 11  . carboxymethylcellulose 1 % ophthalmic solution 1 drop 3 (three) times daily.    Marland Kitchen desonide (DESOWEN) 0.05 % lotion Apply topically 2 (two) times daily. 59 mL 11  . IBU 600 MG  tablet Take 600 mg by mouth every 8 (eight) hours as needed for headache or moderate pain.   0  . loratadine (CLARITIN) 10 MG tablet Take 10 mg by mouth daily.    . Multiple Vitamin (MULTIVITAMIN WITH MINERALS) TABS Take 1 tablet by mouth daily.    . simvastatin (ZOCOR) 20 MG tablet Take 1 tablet (20 mg total) by mouth daily. 30 tablet 11  . tacrolimus (PROTOPIC) 0.1 % ointment APPLY EXTERNALLY TO THE AFFECTED AREA TWICE DAILY 30 g 5  . tadalafil (CIALIS) 20 MG tablet Take 1 tablet (20 mg total) by mouth daily as needed for erectile dysfunction. 10 tablet 11  . Tetrahydrozoline HCl (EYE DROPS OP) Apply 2 drops to eye as needed (for redness in eyes).    . triamcinolone cream (KENALOG) 0.1 % Apply 1 application topically 2 (two) times daily. 45 g 2  . triamterene-hydrochlorothiazide (MAXZIDE-25) 37.5-25 MG tablet TAKE 1 TABLET BY MOUTH DAILY 30 tablet 11   Current Facility-Administered Medications on File Prior to Visit  Medication Dose Route Frequency Provider Last Rate Last Admin  . 0.9 %  sodium chloride infusion  500 mL Intravenous Once Ladene Artist, MD        BP 122/82   Pulse 84   Temp 98.7 F (37.1 C) (Temporal)   Wt 217 lb (98.4 kg)   SpO2 96%   BMI 30.70 kg/m       Objective:   Physical Exam Vitals and nursing note reviewed.  Constitutional:      Appearance: Normal appearance.  Cardiovascular:     Rate and Rhythm: Normal rate and regular rhythm.     Pulses: Normal pulses.     Heart sounds: Normal heart sounds.  Pulmonary:     Effort: Pulmonary effort is normal.     Breath sounds: Normal breath sounds.  Musculoskeletal:        General: Tenderness (Producible pain with palpation along the left clavicle) present. Normal range of motion.  Skin:    General: Skin is warm and dry.  Neurological:     General: No focal deficit present.     Mental Status: He is alert and oriented to person, place, and time.  Psychiatric:        Mood and Affect: Mood normal.         Behavior: Behavior normal.        Thought Content: Thought content normal.        Judgment: Judgment normal.       Assessment & Plan:  1. Pain of left clavicle -Not concerned about acute cardiac event.  Appears to be more muscular or tendon related.  He was advised to take Motrin 600 mg every 6 hours x3 days and perform stretching exercises.  Follow-up if no improvement  Dorothyann Peng, NP

## 2019-08-02 NOTE — Patient Instructions (Signed)
It was great seeing you today   The pain you are experiencing is not cardiac pain. I think it is more ligament or muscular. You can use Motrin 600 mg every 6-8 hours over the next 3 days and do some stretching exercises

## 2019-08-23 ENCOUNTER — Ambulatory Visit: Payer: 59 | Attending: Family Medicine | Admitting: Physical Therapy

## 2019-08-23 ENCOUNTER — Other Ambulatory Visit: Payer: Self-pay

## 2019-08-23 DIAGNOSIS — M6281 Muscle weakness (generalized): Secondary | ICD-10-CM | POA: Diagnosis present

## 2019-08-23 DIAGNOSIS — M79651 Pain in right thigh: Secondary | ICD-10-CM | POA: Diagnosis not present

## 2019-08-23 NOTE — Patient Instructions (Signed)
Access Code: [email or web address] URL: [email or web address] Date: 07/25/2019 Prepared by: Ruben Im  Exercises Prone Press Up - 1 x daily - 7 x weekly - 1 sets - 10 reps Standing Lumbar Extension - 1 x daily - 7 x weekly - 1 sets - 10 reps Hip Flexor Stretch at Edge of Bed - 1 x daily - 7 x weekly - 1 sets - 3 reps - 20 hold Hip Flexor Stretch with Chair - 1 x daily - 7 x weekly - 1 sets - 3 reps - 20 hold Supine Hamstring Stretch with Strap - 1 x daily - 7 x weekly - 1 sets - 3 reps - 20 hold Supine Hip External Rotation Stretch - 1 x daily - 7 x weekly - 1 sets - 3 reps Supine Piriformis Stretch with Leg Straight - 1 x daily - 7 x weekly - 1 sets - 3 reps Hooklying Isometric Hip Flexion - 1 x daily - 7 x weekly - 1 sets - 10 reps Supine 90/90 Alternating Toe Touch - 1 x daily - 7 x weekly - 1 sets - 10 reps Sidelying Hip Abduction - 1 x daily - 7 x weekly - 1 sets - 10 reps Bird Dog - 1 x daily - 7 x weekly - 1 sets - 10 reps

## 2019-08-23 NOTE — Therapy (Addendum)
[facility] [address], [place], [place], [address] Phone: [phone]   Fax:  [phone]  Physical Therapy Treatment/Discharge Summary   Patient Details  Name: Jerry Crawford MRN: [id number] Date of Birth: 12-Jun-1960 Referring Provider (PT): Dr. Sarajane Jews    Encounter Date: 08/23/2019  PT End of Session - 08/23/19 1231    Visit Number  2    Date for PT Re-Evaluation  09/19/19    Authorization Type  [employer]    PT Start Time  0930    PT Stop Time  1008    PT Time Calculation (min)  38 min    Activity Tolerance  Patient tolerated treatment well       Past Medical History:  Diagnosis Date  . Allergy   . Hypercholesteremia   . Hyperlipidemia   . Hypertension     Past Surgical History:  Procedure Laterality Date  . COLONOSCOPY  06/02/2017   per Dr. Fuller Plan, adenomatous polyp, repeat in 5 yrs   . ENDOVENOUS ABLATION SAPHENOUS VEIN W/ LASER Left 11/08/2016   endovenous laser ablation left small saphenous vein by Tinnie Gens MD   . ENDOVENOUS Marcus W/ LASER Left 11/23/2016   endovenous laser ablation left greater saphenous vein and stab phlebectomy > 20 incisions left leg by Tinnie Gens MD  . ENDOVENOUS ABLATION SAPHENOUS VEIN W/ LASER Right 12/26/2016   endovenous laser ablation R greater saphenous vein and stab phlebectomy > 20 incisions R leg by Tinnie Gens MD     There were no vitals filed for this visit.  Subjective Assessment - 08/23/19 0935    Subjective  Less often thigh pain.  Once every 2 days.  Stretches reallly help.  Stepping up and down a lot at work.    Currently in Pain?  No/denies    Pain Score  0-No pain    Pain Orientation  Right         [facility] PT Assessment - 08/23/19 0001      AROM   Overall AROM Comments  25% limitation right hip internal and external rotation     Lumbar Flexion  80      Flexibility   Hamstrings  70 degrees on right; 70 degrees left      Quadriceps  improved right hip flexor length                    [facility] Adult PT Treatment/Exercise - 08/23/19 0001      Self-Care   Self-Care  Other Self-Care Comments    Other Self-Care Comments   discussed pt return to [employer] and interval walk/run program       Lumbar Exercises: Supine   Ab Set  5 reps    Bent Knee Raise  10 reps    Isometric Hip Flexion  10 reps      Lumbar Exercises: Quadruped   Single Arm Raise  Right;Left;5 reps    Opposite Arm/Leg Raise  Right arm/Left leg;Left arm/Right leg;10 reps      Knee/Hip Exercises: Stretches   Piriformis Stretch  Right;Left;2 reps;20 seconds    Other Knee/Hip Stretches  review of initial HEP stretches     Other Knee/Hip Stretches  hip rotator stretches per HEP       Knee/Hip Exercises: Sidelying   Hip ABduction  Strengthening;Right;Left;15 reps             PT Education - 08/23/19 1018    Education Details  Access  Code: [email or web address]  hip rotator stretches; abdominal brace series, bird dogs, hip abduction    Person(s) Educated  Patient    Methods  Explanation;Demonstration;Handout    Comprehension  Returned demonstration;Verbalized understanding       PT Short Term Goals - 08/23/19 1235      PT SHORT TERM GOAL #1   Title  The patient will demonstrate awareness of basic self care and appropriate initial HEP    Status  Achieved        PT Long Term Goals - 07/25/19 0916      PT LONG TERM GOAL #1   Title  The patient will be independent in safe self progression of HEP    Time  8    Period  Weeks    Status  New    Target Date  09/19/19      PT LONG TERM GOAL #2   Title  The patient will have right hip extension to 15 degrees indicating improved hip flexor length    Time  8    Period  Weeks    Status  New      PT LONG TERM GOAL #3   Title  Bil hip abduction strength to 4+/5 needed for frequent stepping up/down at work    Time  8    Period  Weeks    Status  New      PT LONG TERM GOAL #4   Title   The patient will report a 60% improvement in right posterior thigh and buttock pain with work and home activities.    Time  8    Period  Weeks    Status  New      PT LONG TERM GOAL #5   Title  FOTO functional outcome score improved from 25% limitation to 19% indicating improved function with less pain    Time  8    Period  Weeks    Status  New            Plan - 08/23/19 0940    Clinical Impression Statement  The patient reports signficantly reduced right posterior thigh pain over the last few weeks with only mild discomfort every couple of days.  He has much improved right hip flexor length and HS length.  Progressed HEP to include lumbar/pelvic/hip core strengthening for further improvements and decreased risk of re-injury.  The patient is limited in ability to attend secondary to work constraints.  He would like to follow up in 4 weeks.    Rehab Potential  Good    PT Frequency  1x / week    PT Duration  8 weeks    PT Treatment/Interventions  ADLs/Self Care Home Management;Cryotherapy;Electrical Stimulation;Moist Heat;Ultrasound;Therapeutic exercise;Therapeutic activities;Neuromuscular re-education;Manual techniques;Patient/family education;Taping;Dry needling    PT Next Visit Plan  recheck FOTO, goals,  MMT; right hip flexor length and HS;  core lumbo/pelvic/hip strengthening;  discuss his return to ex    PT Home Exercise Plan  Access Code: [email or web address]       Patient will benefit from skilled therapeutic intervention in order to improve the following deficits and impairments:  Pain, Increased fascial restricitons, Decreased range of motion, Decreased strength  Visit Diagnosis: Pain in right thigh  Muscle weakness (generalized)  PHYSICAL THERAPY DISCHARGE SUMMARY  Visits from Start of Care: 2  Current functional level related to goals / functional outcomes: Patient missed last scheduled appt.  When called he states he was out of town but is doing very well with  relief of  symptoms.  He does not feel he needs additional PT.     Remaining deficits: As above    Education / Equipment: HEP Plan: Patient agrees to discharge.  Patient goals were partially met. Patient is being discharged due to being pleased with the current functional level.  ?????       Problem List Patient Active Problem List   Diagnosis Date Noted  . Erectile dysfunction 09/01/2017  . Premature ejaculation 03/13/2017  . Seasonal allergies 07/29/2016  . Hypertension 07/29/2016  . Decreased testosterone level 06/15/2016  . Ganglion cyst of wrist, left 06/15/2016  . Elevated fasting blood sugar 06/15/2016  . Varicose veins of bilateral lower extremities with other complications 72/17/1165    Jerry Crawford, PT 08/23/19 12:37 PM Phone: [phone] Fax: [phone] Jerry Crawford 08/23/2019, 12:36 PM  [facility] [address] [place], [place], [address] Phone: [phone]   Fax:  [phone]  Name: Jerry Crawford MRN: [id number] Date of Birth: 07/03/1960

## 2019-09-20 ENCOUNTER — Telehealth: Payer: Self-pay | Admitting: Physical Therapy

## 2019-09-20 ENCOUNTER — Ambulatory Visit: Payer: 59 | Attending: Family Medicine | Admitting: Physical Therapy

## 2019-09-20 NOTE — Telephone Encounter (Signed)
Called and spoke with pt (missed appt today).  He was out of town.  He is doing well and feels he does not need additional PT.

## 2019-10-11 ENCOUNTER — Telehealth: Payer: Self-pay | Admitting: Family Medicine

## 2019-10-11 NOTE — Telephone Encounter (Signed)
Pt is calling in needing the following Rx tacrolimus (PROTOPIC) 0.1 % cream.    Pharm:   [facility] on [address] in [place], [place]

## 2019-10-30 ENCOUNTER — Other Ambulatory Visit: Payer: Self-pay

## 2019-10-30 MED ORDER — TACROLIMUS 0.1 % EX OINT: TOPICAL_OINTMENT | CUTANEOUS | 5 refills | Status: DC

## 2019-11-16 ENCOUNTER — Other Ambulatory Visit: Payer: Self-pay | Admitting: Family Medicine

## 2019-11-18 NOTE — Telephone Encounter (Signed)
Please advise. Rx is not on the current med list 

## 2020-02-07 ENCOUNTER — Other Ambulatory Visit: Payer: Self-pay | Admitting: Family Medicine

## 2020-03-20 ENCOUNTER — Encounter: Payer: Self-pay | Admitting: Family Medicine

## 2020-03-20 ENCOUNTER — Ambulatory Visit: Payer: 59 | Admitting: Family Medicine

## 2020-03-20 ENCOUNTER — Other Ambulatory Visit: Payer: Self-pay

## 2020-03-20 VITALS — BP 118/72 | HR 81 | Temp 98.0°F | Wt 209.4 lb

## 2020-03-20 DIAGNOSIS — R55 Syncope and collapse: Secondary | ICD-10-CM

## 2020-03-20 DIAGNOSIS — H66001 Acute suppurative otitis media without spontaneous rupture of ear drum, right ear: Secondary | ICD-10-CM

## 2020-03-20 NOTE — Patient Instructions (Signed)
Syncope  Syncope refers to a condition in which a person temporarily loses consciousness. Syncope may also be called fainting or passing out. It is caused by a sudden decrease in blood flow to the brain. Even though most causes of syncope are not dangerous, syncope can be a sign of a serious medical problem. Your health care provider may do tests to find the reason why you are having syncope. Signs that you may be about to faint include:  Feeling dizzy or light-headed.  Feeling nauseous.  Seeing all white or all black in your field of vision.  Having cold, clammy skin. If you faint, get medical help right away. Call your local emergency services (911 in the [place] Do not drive yourself to the hospital. Follow these instructions at home: Pay attention to any changes in your symptoms. Take these actions to stay safe and to help relieve your symptoms: Lifestyle  Do not drive, use machinery, or play sports until your health care provider says it is okay.  Do not drink alcohol.  Do not use any products that contain nicotine or tobacco, such as cigarettes and e-cigarettes. If you need help quitting, ask your health care provider.  Drink enough fluid to keep your urine pale yellow. General instructions  Take over-the-counter and prescription medicines only as told by your health care provider.  If you are taking blood pressure or heart medicine, get up slowly and take several minutes to sit and then stand. This can reduce dizziness or light-headedness.  Have someone stay with you until you feel stable.  If you start to feel like you might faint, lie down right away and raise (elevate) your feet above the level of your heart. Breathe deeply and steadily. Wait until all the symptoms have passed.  Keep all follow-up visits as told by your health care provider. This is important. Get help right away if you:  Have a severe headache.  Faint once or repeatedly.  Have pain in your chest,  abdomen, or back.  Have a very fast or irregular heartbeat (palpitations).  Have pain when you breathe.  Are bleeding from your mouth or rectum, or you have black or tarry stool.  Have a seizure.  Are confused.  Have trouble walking.  Have severe weakness.  Have vision problems. These symptoms may represent a serious problem that is an emergency. Do not wait to see if your symptoms will go away. Get medical help right away. Call your local emergency services (911 in the [place] Do not drive yourself to the hospital. Summary  Syncope refers to a condition in which a person temporarily loses consciousness. It is caused by a sudden decrease in blood flow to the brain.  Signs that you may be about to faint include dizziness, feeling light-headed, feeling nauseous, sudden vision changes, or cold, clammy skin.  Although most causes of syncope are not dangerous, syncope can be a sign of a serious medical problem. If you faint, get medical help right away. This information is not intended to replace advice given to you by your health care provider. Make sure you discuss any questions you have with your health care provider. Document Revised: 03/10/2017 Document Reviewed: 03/06/2017 Elsevier Patient Education  2020 [employer].

## 2020-03-20 NOTE — Progress Notes (Signed)
Subjective:    Patient ID: Jerry Crawford, male    DOB: Jul 07, 1960, 59 y.o.   MRN: [id number]  No chief complaint on file.   HPI Patient was seen today for ongoing concern.  Pt with syncopal episodes and elevated HR twice in the last 2 months.  First episode was after urinating and the second was while in the shower.  Pt hears heart beat in his ears when going up stairs.  Denies palpitations or dizziness.  Was drinking 32 oz coffee, but stopped. Not drinking soda. May have 1 cup of green tea.  Drinking 1 bottle of water per day.        Past Medical History:  Diagnosis Date  . Allergy   . Hypercholesteremia   . Hyperlipidemia   . Hypertension     No Known Allergies  ROS General: Denies fever, chills, night sweats, changes in weight, changes in appetite  + syncope HEENT: Denies headaches, ear pain, changes in vision, rhinorrhea, sore throat CV: Denies CP, SOB, orthopnea  +elevated heart rate Pulm: Denies SOB, cough, wheezing GI: Denies abdominal pain, nausea, vomiting, diarrhea, constipation GU: Denies dysuria, hematuria, frequency Msk: Denies muscle cramps, joint pains Neuro: Denies weakness, numbness, tingling Skin: Denies rashes, bruising Psych: Denies depression, anxiety, hallucinations     Objective:    Blood pressure 118/72, pulse 81, temperature 98 F (36.7 C), temperature source Oral, weight 209 lb 6.4 oz (95 kg), SpO2 96 %.   Gen. Pleasant, well-nourished, in no distress, normal affect   HEENT: [place]/AT, face symmetric, conjunctiva clear, no scleral icterus, PERRLA, EOMI, nares patent without drainage, pharynx without erythema or exudate.  R TM full with erythema and suppurative fluid.  L TM normal. Neck: No JVD, no thyromegaly, no carotid bruits Lungs: no accessory muscle use, CTAB, no wheezes or rales Cardiovascular: RRR, no m/r/g, no peripheral edema Musculoskeletal: No deformities, no cyanosis or clubbing, normal tone Neuro:  A&Ox3, CN II-XII intact, normal  gait Skin:  Warm, no lesions/ rash   Wt Readings from Last 3 Encounters:  03/20/20 209 lb 6.4 oz (95 kg)  08/02/19 217 lb (98.4 kg)  07/08/19 217 lb 3.2 oz (98.5 kg)    Lab Results  Component Value Date   WBC 3.9 (L) 06/24/2019   HGB 16.2 06/24/2019   HCT 47.4 06/24/2019   PLT 226.0 06/24/2019   GLUCOSE 103 (H) 06/24/2019   CHOL 132 06/24/2019   TRIG 123.0 06/24/2019   HDL 46.60 06/24/2019   LDLCALC 61 06/24/2019   ALT 25 06/24/2019   AST 20 06/24/2019   NA 138 06/24/2019   K 3.7 06/24/2019   CL 100 06/24/2019   CREATININE 0.90 06/24/2019   BUN 13 06/24/2019   CO2 33 (H) 06/24/2019   TSH 1.77 06/24/2019   PSA 0.62 06/24/2019   HGBA1C 6.5 11/04/2016    Assessment/Plan:  Syncope, unspecified syncope type  -discussed possible causes including arrhythmia, orthostatic hypotension, thyroid dysfunction -EKG this visit done x 2 as 1st study with aVR and aVL incorrectly placed.  Non-Specific ST elevation in V2, EKG from 03/30/2012 reviewed for comparison. -Given precautions -Patient encouraged to increase p.o. intake of water and fluids and decrease caffeine intake. - Plan: EKG 12-Lead, CMP with eGFR([facility]), Troponin I, CBC (no diff), TSH, T4, free, Hemoglobin A1c, Hemoglobin A1c, T4, free, TSH, CBC (no diff), Troponin I, CMP with eGFR([facility])  Acute suppurative otitis media of right ear without spontaneous rupture of tympanic membrane, recurrence not specified -Continue supportive care - Plan: amoxicillin-clavulanate (  AUGMENTIN) 500-125 MG tablet  F/u as needed with PCP for continued or worsening symptoms.  Grier Mitts, MD

## 2020-03-21 LAB — COMPLETE METABOLIC PANEL WITH GFR
AG Ratio: 2.1 (calc) (ref 1.0–2.5)
ALT: 17 U/L (ref 9–46)
AST: 20 U/L (ref 10–35)
Albumin: 4.7 g/dL (ref 3.6–5.1)
Alkaline phosphatase (APISO): 49 U/L (ref 35–144)
BUN: 16 mg/dL (ref 7–25)
CO2: 28 mmol/L (ref 20–32)
Calcium: 9.9 mg/dL (ref 8.6–10.3)
Chloride: 101 mmol/L (ref 98–110)
Creat: 0.98 mg/dL (ref 0.70–1.33)
GFR, Est African American: 99 mL/min/{1.73_m2} (ref >=60)
GFR, Est Non African American: 85 mL/min/{1.73_m2} (ref >=60)
Globulin: 2.2 g/dL (calc) (ref 1.9–3.7)
Glucose, Bld: 80 mg/dL (ref 65–99)
Potassium: 3.9 mmol/L (ref 3.5–5.3)
Sodium: 138 mmol/L (ref 135–146)
Total Bilirubin: 1.7 mg/dL — ABNORMAL HIGH (ref 0.2–1.2)
Total Protein: 6.9 g/dL (ref 6.1–8.1)

## 2020-03-21 LAB — HEMOGLOBIN A1C
Hgb A1c MFr Bld: 6.2 % of total Hgb — ABNORMAL HIGH (ref <5.7)
Mean Plasma Glucose: 131 mg/dL
eAG (mmol/L): 7.3 mmol/L

## 2020-03-21 LAB — CBC
HCT: 49.4 % (ref 38.5–50.0)
Hemoglobin: 16.9 g/dL (ref 13.2–17.1)
MCH: 29.9 pg (ref 27.0–33.0)
MCHC: 34.2 g/dL (ref 32.0–36.0)
MCV: 87.3 fL (ref 80.0–100.0)
MPV: 9.7 fL (ref 7.5–12.5)
Platelets: 227 10*3/uL (ref 140–400)
RBC: 5.66 10*6/uL (ref 4.20–5.80)
RDW: 12.9 % (ref 11.0–15.0)
WBC: 3 10*3/uL — ABNORMAL LOW (ref 3.8–10.8)

## 2020-03-21 LAB — T4, FREE: Free T4: 0.9 ng/dL (ref 0.8–1.8)

## 2020-03-21 LAB — TROPONIN I: Troponin I: 3 ng/L (ref <=47)

## 2020-03-21 LAB — TSH: TSH: 1.49 mIU/L (ref 0.40–4.50)

## 2020-03-23 ENCOUNTER — Telehealth: Payer: Self-pay | Admitting: Family Medicine

## 2020-03-23 NOTE — Telephone Encounter (Signed)
Patient called and stated that she was seen by Dr. Volanda Napoleon and she was supposed to send amoxacillin for his ear but hasn't received it yet. Pt uses  [facility] [phone] - [address], [place] [address]  Phone:  [phone] Fax:  [phone]  CB is [phone]

## 2020-03-27 ENCOUNTER — Telehealth: Payer: Self-pay | Admitting: Family Medicine

## 2020-03-27 NOTE — Telephone Encounter (Signed)
Patient is calling and stated he is returning the call, please advise. CB is [phone]

## 2020-03-30 ENCOUNTER — Encounter: Payer: Self-pay | Admitting: Family Medicine

## 2020-03-30 MED ORDER — AMOXICILLIN-POT CLAVULANATE 500-125 MG PO TABS: 1.0000 | ORAL_TABLET | Freq: Two times a day (BID) | ORAL | 0 refills | Status: AC

## 2020-03-30 NOTE — Telephone Encounter (Signed)
Left a message for pt to return my call in the office, pt did not state the reason for the call.

## 2020-03-30 NOTE — Telephone Encounter (Signed)
See result note.  

## 2020-03-31 ENCOUNTER — Ambulatory Visit: Payer: 59 | Admitting: Family Medicine

## 2020-04-08 NOTE — Telephone Encounter (Signed)
Rx was sent  

## 2020-04-13 ENCOUNTER — Other Ambulatory Visit: Payer: Self-pay | Admitting: Family Medicine

## 2020-04-15 ENCOUNTER — Other Ambulatory Visit: Payer: Self-pay

## 2020-04-15 ENCOUNTER — Encounter: Payer: Self-pay | Admitting: Family Medicine

## 2020-04-15 ENCOUNTER — Ambulatory Visit: Payer: 59 | Admitting: Family Medicine

## 2020-04-15 VITALS — BP 116/70 | HR 97 | Temp 97.5°F | Ht 70.0 in | Wt 215.2 lb

## 2020-04-15 DIAGNOSIS — R55 Syncope and collapse: Secondary | ICD-10-CM | POA: Diagnosis not present

## 2020-04-15 DIAGNOSIS — D72819 Decreased white blood cell count, unspecified: Secondary | ICD-10-CM | POA: Diagnosis not present

## 2020-04-15 DIAGNOSIS — I10 Essential (primary) hypertension: Secondary | ICD-10-CM

## 2020-04-15 MED ORDER — AMLODIPINE BESYLATE 5 MG PO TABS: 5.0000 mg | ORAL_TABLET | Freq: Every day | ORAL | 3 refills | Status: DC

## 2020-04-15 NOTE — Telephone Encounter (Signed)
Last office visit- 04/15/2020 Last refill- 06/24/2019--30 tabs with 11 refills

## 2020-04-15 NOTE — Progress Notes (Signed)
   Subjective:    Patient ID: Jerry Crawford, male    DOB: November 24, 1960, 60 y.o.   MRN: [id number]  HPI Here to follow up on syncope and to ask about lab results. He saw Dr. Salomon Fick on 03-20-20 for 2 episodes of brief syncope or near syncope. Both were related either to standing up in the shower or just after urinating, and the most likely explanation was orthostatic hypotension. His BP has been borderline low. He has had no further episodes. Also his lab work that day revealed a WBC of 3.0. This was down from 3.3 a year ago. Hgb and platelets are normal.    Review of Systems  Constitutional: Negative.   Respiratory: Negative.   Cardiovascular: Negative.   Neurological: Negative.        Objective:   Physical Exam Constitutional:      Appearance: Normal appearance.  Cardiovascular:     Rate and Rhythm: Normal rate and regular rhythm.     Pulses: Normal pulses.     Heart sounds: Normal heart sounds.  Pulmonary:     Effort: Pulmonary effort is normal.     Breath sounds: Normal breath sounds.  Neurological:     General: No focal deficit present.     Mental Status: He is alert and oriented to person, place, and time.           Assessment & Plan:  He has had some orthostatic hypotension and his HTN may be overtreated. We will reduce the Amlodipine to 5 mg daily. For the leukopenia, we will refer him to Hematology to evaluate.  Gershon Crane, MD

## 2020-04-16 ENCOUNTER — Telehealth: Payer: Self-pay | Admitting: Hematology and Oncology

## 2020-04-16 NOTE — Telephone Encounter (Signed)
Received a new hem referral from Dr. Clent Ridges for leukopenia. Pt has been cld and scheduled to see Dr. Pamelia Hoit on 1/17 at 830am. Pt aware to arrive 20 minutes early.

## 2020-04-24 ENCOUNTER — Telehealth: Payer: Self-pay | Admitting: Hematology and Oncology

## 2020-04-24 NOTE — Telephone Encounter (Signed)
Mr. Ciavarella has been rescheduled to see Dr. Lindi Adie on 1/28 at 845am

## 2020-04-27 ENCOUNTER — Inpatient Hospital Stay: Payer: 59 | Admitting: Hematology and Oncology

## 2020-05-07 NOTE — Progress Notes (Signed)
[place] NOTE  Patient Care Team: Laurey Morale, MD as PCP - General (Family Medicine)  CHIEF COMPLAINTS/PURPOSE OF CONSULTATION:  Newly diagnosed leukopenia  HISTORY OF PRESENTING ILLNESS:  Jerry Crawford 60 y.o. male is here because of recent diagnosis of leukopenia. leukopenia. He is referred by Dr. Sarajane Jews. Labs on 06/24/19 showed WBC 3.9, ANC 1.6, and on 03/20/20 showed WBC 3.0. He presents to the clinic today for initial evaluation.  He reports no major problems or concerns.  He has not had any frequent infections.  He denies any fevers or chills or nausea or vomiting.  He has had intermittent abdominal discomfort but he had upper endoscopy colonoscopy which were negative.  He is questioning if he has leaky gut syndrome.  I reviewed his records extensively and collaborated the history with the patient.  MEDICAL HISTORY:  Past Medical History:  Diagnosis Date  . Allergy   . Hypercholesteremia   . Hyperlipidemia   . Hypertension     SURGICAL HISTORY: Past Surgical History:  Procedure Laterality Date  . COLONOSCOPY  06/02/2017   per Dr. Fuller Plan, adenomatous polyp, repeat in 5 yrs   . ENDOVENOUS ABLATION SAPHENOUS VEIN W/ LASER Left 11/08/2016   endovenous laser ablation left small saphenous vein by Tinnie Gens MD   . ENDOVENOUS Alexander W/ LASER Left 11/23/2016   endovenous laser ablation left greater saphenous vein and stab phlebectomy > 20 incisions left leg by Tinnie Gens MD  . ENDOVENOUS ABLATION SAPHENOUS VEIN W/ LASER Right 12/26/2016   endovenous laser ablation R greater saphenous vein and stab phlebectomy > 20 incisions R leg by Tinnie Gens MD     SOCIAL HISTORY: Social History   Socioeconomic History  . Marital status: Single    Spouse name: Not on file  . Number of children: 4  . Years of education: 84  . Highest education level: Not on file  Occupational History  . Occupation: Glass blower/designer  Tobacco Use  .  Smoking status: Never Smoker  . Smokeless tobacco: Never Used  Vaping Use  . Vaping Use: Never used  Substance and Sexual Activity  . Alcohol use: Yes    Comment: Rarely <1/month  . Drug use: No  . Sexual activity: Not on file  Other Topics Concern  . Not on file  Social History Narrative   ** Merged History Encounter **       Fun/Hobby: Dancing, basketball, baseball, going out every once in a while    Social Determinants of Radio broadcast assistant Strain: Not on file  Food Insecurity: Not on file  Transportation Needs: Not on file  Physical Activity: Not on file  Stress: Not on file  Social Connections: Not on file  Intimate Partner Violence: Not on file    FAMILY HISTORY: Family History  Problem Relation Age of Onset  . Asthma Mother   . Hypertension Mother   . Asthma Sister   . Asthma Brother   . Healthy Father   . Hypertension Sister   . Colon cancer Neg Hx   . Colon polyps Neg Hx   . Rectal cancer Neg Hx   . Stomach cancer Neg Hx   . Esophageal cancer Neg Hx     ALLERGIES:  has No Known Allergies.  MEDICATIONS:  Current Outpatient Medications  Medication Sig Dispense Refill  . amLODipine (NORVASC) 5 MG tablet Take 1 tablet (5 mg total) by mouth daily. 90 tablet 3  . azelastine (  OPTIVAR) 0.05 % ophthalmic solution Place 1 drop into both eyes 2 (two) times daily. (Patient not taking: Reported on 04/15/2020) 6 mL 11  . carboxymethylcellulose 1 % ophthalmic solution 1 drop 3 (three) times daily. (Patient not taking: Reported on 04/15/2020)    . desonide (DESOWEN) 0.05 % lotion Apply topically 2 (two) times daily. 59 mL 11  . IBU 600 MG tablet Take 600 mg by mouth every 8 (eight) hours as needed for headache or moderate pain.  (Patient not taking: Reported on 04/15/2020)  0  . loratadine (CLARITIN) 10 MG tablet Take 10 mg by mouth daily.    . Multiple Vitamin (MULTIVITAMIN WITH MINERALS) TABS Take 1 tablet by mouth daily. (Patient not taking: Reported on 04/15/2020)     . PARoxetine (PAXIL) 10 MG tablet Take 10 mg by mouth daily. (Patient not taking: Reported on 04/15/2020)    . sildenafil (VIAGRA) 100 MG tablet TAKE 1 TABLET(100 MG) BY MOUTH DAILY AS NEEDED FOR ERECTILE DYSFUNCTION (Patient not taking: Reported on 04/15/2020) 6 tablet 11  . simvastatin (ZOCOR) 20 MG tablet TAKE 1 TABLET BY MOUTH EVERY DAY 30 tablet 11  . tacrolimus (PROTOPIC) 0.1 % ointment APPLY EXTERNALLY TO THE AFFECTED AREA TWICE DAILY (Patient not taking: Reported on 04/15/2020) 30 g 5  . tadalafil (CIALIS) 20 MG tablet Take 1 tablet (20 mg total) by mouth daily as needed for erectile dysfunction. 10 tablet 11  . Tetrahydrozoline HCl (EYE DROPS OP) Apply 2 drops to eye as needed (for redness in eyes).    . triamcinolone cream (KENALOG) 0.1 % Apply 1 application topically 2 (two) times daily. 45 g 2  . triamterene-hydrochlorothiazide (MAXZIDE-25) 37.5-25 MG tablet TAKE 1 TABLET BY MOUTH DAILY 30 tablet 11   Current Facility-Administered Medications  Medication Dose Route Frequency Provider Last Rate Last Admin  . 0.9 %  sodium chloride infusion  500 mL Intravenous Once Ladene Artist, MD        REVIEW OF SYSTEMS:   Constitutional: Denies fevers, chills or abnormal night sweats   All other systems were reviewed with the patient and are negative.  PHYSICAL EXAMINATION: ECOG PERFORMANCE STATUS: 1 - Symptomatic but completely ambulatory  Vitals:   05/08/20 0857  BP: 135/78  Pulse: 94  Resp: 18  Temp: 98.1 F (36.7 C)  SpO2: 100%   Filed Weights   05/08/20 0857  Weight: 212 lb 14.4 oz (96.6 kg)       LABORATORY DATA:  I have reviewed the data as listed Lab Results  Component Value Date   WBC 3.0 (L) 03/20/2020   HGB 16.9 03/20/2020   HCT 49.4 03/20/2020   MCV 87.3 03/20/2020   PLT 227 03/20/2020   Lab Results  Component Value Date   NA 138 03/20/2020   K 3.9 03/20/2020   CL 101 03/20/2020   CO2 28 03/20/2020    RADIOGRAPHIC STUDIES: I have personally reviewed  the radiological reports and agreed with the findings in the report.  ASSESSMENT AND PLAN:  Leukopenia Lab review: 03/30/2012: WBC 4.8, ANC 2.5 ALC 2 06/15/2016: WBC 3.8 04/21/2017: WBC 3.8, ANC 1.4, ALC 1.8 06/05/2018: WBC 3.3 ANC 1.4, ALC 1.4 07/04/2019: WBC 3.9, ANC 1.6, ALC 1.8 03/20/2020: WBC 3 Rest of the CBC including platelet count and hemoglobin are normal  Review: There has been a slow steady decline in his WBC count although the absolute values of the neutrophils and lymphocytes are in the normal range.  Differential diagnosis: 1. Medications 2. viral illnesses  3. Autoimmune conditions like lupus 4. H-43 or folic acid deficiencies 5. Bone marrow disorders 6. Cyclical neutropenia 7. Ethnicity related    Recommendation: 1. Recheck CBC with differential 2. E-14 and folic acid levels 3. ANA   If no clear-cut etiology can be found we can watch and monitor this.  Patient is not at any increased risk of infection.  If the ANC drops below 1 then we can consider getting a bone marrow biopsy.  I will call him with the result of this test later today.  All questions were answered. The patient knows to call the clinic with any problems, questions or concerns.   Rulon Eisenmenger, MD, MPH 05/08/2020    I, Molly Dorshimer, am acting as scribe for Nicholas Lose, MD.  I have reviewed the above documentation for accuracy and completeness, and I agree with the above.

## 2020-05-08 ENCOUNTER — Inpatient Hospital Stay: Payer: 59

## 2020-05-08 ENCOUNTER — Inpatient Hospital Stay: Payer: 59 | Attending: Hematology and Oncology | Admitting: Hematology and Oncology

## 2020-05-08 ENCOUNTER — Other Ambulatory Visit: Payer: Self-pay

## 2020-05-08 DIAGNOSIS — I10 Essential (primary) hypertension: Secondary | ICD-10-CM | POA: Insufficient documentation

## 2020-05-08 DIAGNOSIS — D72819 Decreased white blood cell count, unspecified: Secondary | ICD-10-CM

## 2020-05-08 DIAGNOSIS — E7800 Pure hypercholesterolemia, unspecified: Secondary | ICD-10-CM | POA: Insufficient documentation

## 2020-05-08 DIAGNOSIS — E785 Hyperlipidemia, unspecified: Secondary | ICD-10-CM | POA: Insufficient documentation

## 2020-05-08 LAB — CBC WITH DIFFERENTIAL (CANCER CENTER ONLY)
Abs Immature Granulocytes: 0.01 10*3/uL (ref 0.00–0.07)
Basophils Absolute: 0 10*3/uL (ref 0.0–0.1)
Basophils Relative: 1 %
Eosinophils Absolute: 0.1 10*3/uL (ref 0.0–0.5)
Eosinophils Relative: 2 %
HCT: 50.3 % (ref 39.0–52.0)
Hemoglobin: 16.8 g/dL (ref 13.0–17.0)
Immature Granulocytes: 0 %
Lymphocytes Relative: 42 %
Lymphs Abs: 1.4 10*3/uL (ref 0.7–4.0)
MCH: 29.4 pg (ref 26.0–34.0)
MCHC: 33.4 g/dL (ref 30.0–36.0)
MCV: 87.9 fL (ref 80.0–100.0)
Monocytes Absolute: 0.4 10*3/uL (ref 0.1–1.0)
Monocytes Relative: 11 %
Neutro Abs: 1.4 10*3/uL — ABNORMAL LOW (ref 1.7–7.7)
Neutrophils Relative %: 44 %
Platelet Count: 222 10*3/uL (ref 150–400)
RBC: 5.72 MIL/uL (ref 4.22–5.81)
RDW: 13 % (ref 11.5–15.5)
WBC Count: 3.3 10*3/uL — ABNORMAL LOW (ref 4.0–10.5)
nRBC: 0 % (ref 0.0–0.2)

## 2020-05-08 LAB — VITAMIN B12: Vitamin B-12: 234 pg/mL (ref 180–914)

## 2020-05-08 LAB — FOLATE: Folate: 23 ng/mL (ref >5.9)

## 2020-05-08 NOTE — Assessment & Plan Note (Signed)
Lab review: 03/30/2012: WBC 4.8, ANC 2.5 ALC 2 06/15/2016: WBC 3.8 04/21/2017: WBC 3.8, ANC 1.4, ALC 1.8 06/05/2018: WBC 3.3 ANC 1.4, ALC 1.4 07/04/2019: WBC 3.9, ANC 1.6, ALC 1.8 03/20/2020: WBC 3 Rest of the CBC including platelet count and hemoglobin are normal  Review: There has been a slow steady decline in his WBC count although the absolute values of the neutrophils and lymphocytes are in the normal range.  Differential diagnosis: 1. Medications 2. viral illnesses 3. Autoimmune conditions like lupus 4. Y-04 or folic acid deficiencies 5. Bone marrow disorders 6. Cyclical neutropenia 7. Ethnicity related    Recommendation: 1. Recheck CBC with differential 2. H-99 and folic acid levels 3. ANA   If no clear-cut etiology can be found we can watch and monitor this.  Patient is not at any increased risk of infection.  If the ANC drops below 1 then we can consider getting a bone marrow biopsy.

## 2020-05-11 LAB — ANTINUCLEAR ANTIBODIES, IFA: ANA Ab, IFA: NEGATIVE

## 2020-05-18 ENCOUNTER — Other Ambulatory Visit: Payer: 59

## 2020-05-18 ENCOUNTER — Other Ambulatory Visit: Payer: Self-pay

## 2020-05-18 DIAGNOSIS — Z20822 Contact with and (suspected) exposure to covid-19: Secondary | ICD-10-CM

## 2020-05-19 LAB — NOVEL CORONAVIRUS, NAA: SARS-CoV-2, NAA: NOT DETECTED

## 2020-05-19 LAB — SARS-COV-2, NAA 2 DAY TAT

## 2020-05-20 ENCOUNTER — Telehealth: Payer: Self-pay | Admitting: Hematology and Oncology

## 2020-05-20 NOTE — Telephone Encounter (Signed)
I called the patient with his lab results Since B 12 is at low end of normal, I encouraged him to take 1000 mcg SL B 12. We will recheck labs in 3 months and follow up. If ANC drops below 1, we will need to do a bone marrow biopsy.

## 2020-06-17 ENCOUNTER — Other Ambulatory Visit: Payer: Self-pay | Admitting: Family Medicine

## 2020-06-21 ENCOUNTER — Other Ambulatory Visit: Payer: Self-pay | Admitting: Family Medicine

## 2020-06-24 ENCOUNTER — Ambulatory Visit (INDEPENDENT_AMBULATORY_CARE_PROVIDER_SITE_OTHER): Payer: 59 | Admitting: Family Medicine

## 2020-06-24 ENCOUNTER — Other Ambulatory Visit: Payer: Self-pay

## 2020-06-24 ENCOUNTER — Encounter: Payer: Self-pay | Admitting: Family Medicine

## 2020-06-24 VITALS — BP 128/78 | HR 84 | Temp 98.6°F | Ht 71.0 in | Wt 214.2 lb

## 2020-06-24 DIAGNOSIS — H93A9 Pulsatile tinnitus, unspecified ear: Secondary | ICD-10-CM | POA: Diagnosis not present

## 2020-06-24 DIAGNOSIS — Z0000 Encounter for general adult medical examination without abnormal findings: Secondary | ICD-10-CM | POA: Diagnosis not present

## 2020-06-24 NOTE — Progress Notes (Addendum)
Subjective:    Patient ID: Jerry Crawford, male    DOB: [birth year], 60 y.o.   MRN: [id number]  HPI Here for a well exam. He feels well but he does mention occasionally hearing his heart beat in his ears when he exerts himself, such as when walking up a fight of steps. No chest pain or SOB or dizziness. His BP remains stable. He saw Dr. Nicholas Lose in January for leukopenia, and he has been monitoring this since then. He is scheduled to follow up with Dr. Lindi Adie on 08-06-20.    Review of Systems  Constitutional: Negative.   HENT: Negative.   Eyes: Negative.   Respiratory: Negative.   Cardiovascular: Negative.   Gastrointestinal: Negative.   Genitourinary: Negative.   Musculoskeletal: Negative.   Skin: Negative.   Neurological: Negative.   Psychiatric/Behavioral: Negative.        Objective:   Physical Exam Constitutional:      General: He is not in acute distress.    Appearance: He is well-developed. He is not diaphoretic.  HENT:     Head: Normocephalic and atraumatic.     Right Ear: External ear normal.     Left Ear: External ear normal.     Nose: Nose normal.     Mouth/Throat:     Pharynx: No oropharyngeal exudate.  Eyes:     General: No scleral icterus.       Right eye: No discharge.        Left eye: No discharge.     Conjunctiva/sclera: Conjunctivae normal.     Pupils: Pupils are equal, round, and reactive to light.  Neck:     Thyroid: No thyromegaly.     Vascular: No carotid bruit or JVD.     Trachea: No tracheal deviation.  Cardiovascular:     Rate and Rhythm: Normal rate and regular rhythm.     Heart sounds: Normal heart sounds. No murmur heard. No friction rub. No gallop.   Pulmonary:     Effort: Pulmonary effort is normal. No respiratory distress.     Breath sounds: Normal breath sounds. No wheezing or rales.  Chest:     Chest wall: No tenderness.  Abdominal:     General: Bowel sounds are normal. There is no distension.     Palpations: Abdomen is  soft. There is no mass.     Tenderness: There is no abdominal tenderness. There is no guarding or rebound.  Genitourinary:    Penis: Normal. No tenderness.      Testes: Normal.     Prostate: Normal.     Rectum: Normal. Guaiac result negative.  Musculoskeletal:        General: No tenderness. Normal range of motion.     Cervical back: Neck supple.  Lymphadenopathy:     Cervical: No cervical adenopathy.  Skin:    General: Skin is warm and dry.     Coloration: Skin is not pale.     Findings: No erythema or rash.  Neurological:     Mental Status: He is alert and oriented to person, place, and time.     Cranial Nerves: No cranial nerve deficit.     Motor: No abnormal muscle tone.     Coordination: Coordination normal.     Deep Tendon Reflexes: Reflexes are normal and symmetric. Reflexes normal.  Psychiatric:        Behavior: Behavior normal.        Thought Content: Thought content normal.  Judgment: Judgment normal.           Assessment & Plan:  Well exam. We discussed diet and exercise. Set up fasting labs soon. I think his pulsatile tinnitus is benign, but we will set up carotid dopplers to investigate. Follow up with Hematology.  Alysia Penna, MD

## 2020-06-24 NOTE — Addendum Note (Signed)
Addended by: Alysia Penna A on: 06/24/2020 09:49 AM   Modules accepted: Orders

## 2020-06-26 ENCOUNTER — Other Ambulatory Visit (INDEPENDENT_AMBULATORY_CARE_PROVIDER_SITE_OTHER): Payer: 59

## 2020-06-26 ENCOUNTER — Other Ambulatory Visit: Payer: Self-pay

## 2020-06-26 DIAGNOSIS — Z0000 Encounter for general adult medical examination without abnormal findings: Secondary | ICD-10-CM

## 2020-06-26 DIAGNOSIS — H93A9 Pulsatile tinnitus, unspecified ear: Secondary | ICD-10-CM

## 2020-06-26 LAB — BASIC METABOLIC PANEL
BUN: 15 mg/dL (ref 6–23)
CO2: 29 mEq/L (ref 19–32)
Calcium: 9.5 mg/dL (ref 8.4–10.5)
Chloride: 102 mEq/L (ref 96–112)
Creatinine, Ser: 0.96 mg/dL (ref 0.40–1.50)
GFR: 87.66 mL/min (ref >60.00)
Glucose, Bld: 106 mg/dL — ABNORMAL HIGH (ref 70–99)
Potassium: 4.1 mEq/L (ref 3.5–5.1)
Sodium: 138 mEq/L (ref 135–145)

## 2020-06-26 LAB — CBC WITH DIFFERENTIAL/PLATELET
Basophils Absolute: 0 10*3/uL (ref 0.0–0.1)
Basophils Relative: 0.5 % (ref 0.0–3.0)
Eosinophils Absolute: 0.1 10*3/uL (ref 0.0–0.7)
Eosinophils Relative: 3.2 % (ref 0.0–5.0)
HCT: 44.3 % (ref 39.0–52.0)
Hemoglobin: 15.2 g/dL (ref 13.0–17.0)
Lymphocytes Relative: 43.5 % (ref 12.0–46.0)
Lymphs Abs: 1.3 10*3/uL (ref 0.7–4.0)
MCHC: 34.4 g/dL (ref 30.0–36.0)
MCV: 86.3 fl (ref 78.0–100.0)
Monocytes Absolute: 0.4 10*3/uL (ref 0.1–1.0)
Monocytes Relative: 11.7 % (ref 3.0–12.0)
Neutro Abs: 1.3 10*3/uL — ABNORMAL LOW (ref 1.4–7.7)
Neutrophils Relative %: 41.1 % — ABNORMAL LOW (ref 43.0–77.0)
Platelets: 194 10*3/uL (ref 150.0–400.0)
RBC: 5.13 Mil/uL (ref 4.22–5.81)
RDW: 13.6 % (ref 11.5–15.5)
WBC: 3 10*3/uL — ABNORMAL LOW (ref 4.0–10.5)

## 2020-06-26 LAB — LIPID PANEL
Cholesterol: 99 mg/dL (ref 0–200)
HDL: 41.6 mg/dL (ref >39.00)
LDL Cholesterol: 45 mg/dL (ref 0–99)
NonHDL: 57.86
Total CHOL/HDL Ratio: 2
Triglycerides: 62 mg/dL (ref 0.0–149.0)
VLDL: 12.4 mg/dL (ref 0.0–40.0)

## 2020-06-26 LAB — HEPATIC FUNCTION PANEL
ALT: 22 U/L (ref 0–53)
AST: 20 U/L (ref 0–37)
Albumin: 4.3 g/dL (ref 3.5–5.2)
Alkaline Phosphatase: 49 U/L (ref 39–117)
Bilirubin, Direct: 0.2 mg/dL (ref 0.0–0.3)
Total Bilirubin: 1.2 mg/dL (ref 0.2–1.2)
Total Protein: 6.5 g/dL (ref 6.0–8.3)

## 2020-06-26 LAB — PSA: PSA: 0.47 ng/mL (ref 0.10–4.00)

## 2020-06-26 LAB — TSH: TSH: 2.01 u[IU]/mL (ref 0.35–4.50)

## 2020-07-01 ENCOUNTER — Telehealth: Payer: Self-pay | Admitting: Hematology and Oncology

## 2020-07-01 NOTE — Telephone Encounter (Signed)
Rescheduled appts from 4/28. Called and spoke with pt, confirmed 5/6 appts

## 2020-07-02 ENCOUNTER — Other Ambulatory Visit: Payer: Self-pay

## 2020-07-02 ENCOUNTER — Ambulatory Visit (HOSPITAL_COMMUNITY)
Admission: RE | Admit: 2020-07-02 | Discharge: 2020-07-02 | Disposition: A | Discharge status: Home / Self Care | Payer: 59 | Source: Ambulatory Visit | Attending: Cardiovascular Disease | Admitting: Cardiovascular Disease

## 2020-07-02 DIAGNOSIS — H93A9 Pulsatile tinnitus, unspecified ear: Secondary | ICD-10-CM | POA: Diagnosis not present

## 2020-07-02 DIAGNOSIS — H93A2 Pulsatile tinnitus, left ear: Secondary | ICD-10-CM

## 2020-07-10 ENCOUNTER — Other Ambulatory Visit: Payer: Self-pay | Admitting: Family Medicine

## 2020-08-06 ENCOUNTER — Ambulatory Visit: Payer: 59 | Admitting: Hematology and Oncology

## 2020-08-06 ENCOUNTER — Other Ambulatory Visit: Payer: 59

## 2020-08-13 NOTE — Progress Notes (Signed)
Patient Care Team: Laurey Morale, MD as PCP - General (Family Medicine)  DIAGNOSIS:    ICD-10-CM   1. Leukopenia, unspecified type  D72.819     CHIEF COMPLIANT: Follow-up of leukopenia  INTERVAL HISTORY: Jerry Crawford is a 60 y.o. with above-mentioned history of leukopenia. Labs on 06/26/20 showed WBC 3.0, ANC 1.3. He presents to the clinic today for follow-up.  He denies any major problems or concerns recently.  He has been taking oral B12 replacement but it has not made a difference to him.  He stays fairly active and has not noticed any change in his energy levels.  He is concerned about continued low white blood cell counts.  ALLERGIES:  has No Known Allergies.  MEDICATIONS:  Current Outpatient Medications  Medication Sig Dispense Refill  . amLODipine (NORVASC) 5 MG tablet Take 1 tablet (5 mg total) by mouth daily. 90 tablet 3  . azelastine (OPTIVAR) 0.05 % ophthalmic solution Place 1 drop into both eyes 2 (two) times daily. 6 mL 11  . desonide (DESOWEN) 0.05 % lotion Apply topically 2 (two) times daily. 59 mL 11  . IBU 600 MG tablet Take 600 mg by mouth every 8 (eight) hours as needed for headache or moderate pain.  0  . loratadine (CLARITIN) 10 MG tablet Take 10 mg by mouth daily.    . Multiple Vitamin (MULTIVITAMIN WITH MINERALS) TABS Take 1 tablet by mouth daily.    . sildenafil (VIAGRA) 100 MG tablet TAKE 1 TABLET(100 MG) BY MOUTH DAILY AS NEEDED FOR ERECTILE DYSFUNCTION 6 tablet 11  . simvastatin (ZOCOR) 20 MG tablet TAKE 1 TABLET BY MOUTH EVERY DAY 30 tablet 11  . tacrolimus (PROTOPIC) 0.1 % ointment APPLY EXTERNALLY TO THE AFFECTED AREA TWICE DAILY 30 g 5  . tadalafil (CIALIS) 20 MG tablet TAKE 1 TABLET(20 MG) BY MOUTH DAILY AS NEEDED FOR ERECTILE DYSFUNCTION 10 tablet 6  . Tetrahydrozoline HCl (EYE DROPS OP) Apply 2 drops to eye as needed (for redness in eyes).    . triamcinolone cream (KENALOG) 0.1 % Apply 1 application topically 2 (two) times daily. 45 g 2  .  triamterene-hydrochlorothiazide (MAXZIDE-25) 37.5-25 MG tablet TAKE 1 TABLET BY MOUTH DAILY 30 tablet 4   Current Facility-Administered Medications  Medication Dose Route Frequency Provider Last Rate Last Admin  . 0.9 %  sodium chloride infusion  500 mL Intravenous Once Ladene Artist, MD        PHYSICAL EXAMINATION: ECOG PERFORMANCE STATUS: 1 - Symptomatic but completely ambulatory  Vitals:   08/14/20 0837  BP: 127/80  Pulse: 67  Resp: 18  Temp: 98.1 F (36.7 C)  SpO2: 100%   Filed Weights   08/14/20 0837  Weight: 211 lb (95.7 kg)    LABORATORY DATA:  I have reviewed the data as listed CMP Latest Ref Rng & Units 06/26/2020 03/20/2020 06/24/2019  Glucose 70 - 99 mg/dL 106(H) 80 103(H)  BUN 6 - 23 mg/dL '15 16 13  ' Creatinine 0.40 - 1.50 mg/dL 0.96 0.98 0.90  Sodium 135 - 145 mEq/L 138 138 138  Potassium 3.5 - 5.1 mEq/L 4.1 3.9 3.7  Chloride 96 - 112 mEq/L 102 101 100  CO2 19 - 32 mEq/L 29 28 33(H)  Calcium 8.4 - 10.5 mg/dL 9.5 9.9 9.9  Total Protein 6.0 - 8.3 g/dL 6.5 6.9 7.1  Total Bilirubin 0.2 - 1.2 mg/dL 1.2 1.7(H) 1.5(H)  Alkaline Phos 39 - 117 U/L 49 - 51  AST 0 - 37  U/L '20 20 20  ' ALT 0 - 53 U/L '22 17 25    ' Lab Results  Component Value Date   WBC 3.0 (L) 08/14/2020   HGB 16.3 08/14/2020   HCT 48.1 08/14/2020   MCV 87.5 08/14/2020   PLT 196 08/14/2020   NEUTROABS 1.0 (L) 08/14/2020    ASSESSMENT & PLAN:  Leukopenia Lab review: 03/30/2012: WBC 4.8, ANC 2.5 ALC 2 06/15/2016: WBC 3.8 04/21/2017: WBC 3.8, ANC 1.4, ALC 1.8 06/05/2018: WBC 3.3 ANC 1.4, ALC 1.4 05/08/20: WBC 3.3, ANC 1.4, B 12: 234, Folate 23, ANA Neg 06/26/20: WBC 3, ANC 1.3 08/14/2020: WBC 3, ANC 1  Rest of the CBC including platelet count and hemoglobin are normal In spite of B12 replacement therapy orally he is not showing any improvement in the white blood cells so we decided to perform a bone marrow biopsy for further analysis.  He would like to do it on Fridays. We will set him up for a  bone marrow biopsy next Friday and I will see him the following Friday to discuss results.  No orders of the defined types were placed in this encounter.  The patient has a good understanding of the overall plan. he agrees with it. he will call with any problems that may develop before the next visit here.  Total time spent: 20 mins including face to face time and time spent for planning, charting and coordination of care  Rulon Eisenmenger, MD, MPH 08/14/2020  I, Cloyde Reams Dorshimer, am acting as scribe for Dr. Nicholas Lose.  I have reviewed the above documentation for accuracy and completeness, and I agree with the above.

## 2020-08-13 NOTE — Assessment & Plan Note (Signed)
Lab review: 03/30/2012: WBC 4.8, ANC 2.5 ALC 2 06/15/2016: WBC 3.8 04/21/2017: WBC 3.8, ANC 1.4, ALC 1.8 06/05/2018: WBC 3.3 ANC 1.4, ALC 1.4 05/08/20: WBC 3.3, ANC 1.4, B 12: 234, Folate 23, ANA Neg 06/26/20: WBC 3, ANC 1.3   Rest of the CBC including platelet count and hemoglobin are normal

## 2020-08-14 ENCOUNTER — Inpatient Hospital Stay: Payer: 59 | Admitting: Hematology and Oncology

## 2020-08-14 ENCOUNTER — Other Ambulatory Visit: Payer: Self-pay

## 2020-08-14 ENCOUNTER — Inpatient Hospital Stay: Payer: 59 | Attending: Hematology and Oncology

## 2020-08-14 DIAGNOSIS — D72819 Decreased white blood cell count, unspecified: Secondary | ICD-10-CM | POA: Diagnosis not present

## 2020-08-14 LAB — CBC WITH DIFFERENTIAL (CANCER CENTER ONLY)
Abs Immature Granulocytes: 0 10*3/uL (ref 0.00–0.07)
Basophils Absolute: 0 10*3/uL (ref 0.0–0.1)
Basophils Relative: 1 %
Eosinophils Absolute: 0.1 10*3/uL (ref 0.0–0.5)
Eosinophils Relative: 2 %
HCT: 48.1 % (ref 39.0–52.0)
Hemoglobin: 16.3 g/dL (ref 13.0–17.0)
Immature Granulocytes: 0 %
Lymphocytes Relative: 49 %
Lymphs Abs: 1.5 10*3/uL (ref 0.7–4.0)
MCH: 29.6 pg (ref 26.0–34.0)
MCHC: 33.9 g/dL (ref 30.0–36.0)
MCV: 87.5 fL (ref 80.0–100.0)
Monocytes Absolute: 0.4 10*3/uL (ref 0.1–1.0)
Monocytes Relative: 14 %
Neutro Abs: 1 10*3/uL — ABNORMAL LOW (ref 1.7–7.7)
Neutrophils Relative %: 34 %
Platelet Count: 196 10*3/uL (ref 150–400)
RBC: 5.5 MIL/uL (ref 4.22–5.81)
RDW: 13.2 % (ref 11.5–15.5)
WBC Count: 3 10*3/uL — ABNORMAL LOW (ref 4.0–10.5)
nRBC: 0 % (ref 0.0–0.2)

## 2020-08-18 ENCOUNTER — Telehealth: Payer: Self-pay | Admitting: Hematology and Oncology

## 2020-08-18 NOTE — Telephone Encounter (Signed)
Scheduled appt per 5/6 sch msg. Called pt, no answer. Left msg with appt date and time.  

## 2020-08-19 ENCOUNTER — Telehealth: Payer: Self-pay | Admitting: Hematology and Oncology

## 2020-08-19 NOTE — Telephone Encounter (Signed)
Scheduled per 5/6 los. Pt will receive an updated appt calendar per next visit  

## 2020-08-21 ENCOUNTER — Other Ambulatory Visit: Payer: Self-pay

## 2020-08-21 ENCOUNTER — Inpatient Hospital Stay: Payer: 59

## 2020-08-21 ENCOUNTER — Inpatient Hospital Stay (HOSPITAL_BASED_OUTPATIENT_CLINIC_OR_DEPARTMENT_OTHER): Payer: 59 | Admitting: Hematology and Oncology

## 2020-08-21 VITALS — BP 142/91 | HR 77 | Temp 98.1°F | Resp 18

## 2020-08-21 DIAGNOSIS — D72819 Decreased white blood cell count, unspecified: Secondary | ICD-10-CM

## 2020-08-21 MED ORDER — LIDOCAINE HCL 2 % IJ SOLN
INTRAMUSCULAR | Status: AC
  Filled 2020-08-21: qty 20

## 2020-08-21 NOTE — Progress Notes (Signed)
INDICATION: Leukopenia    Bone Marrow Biopsy and Aspiration Procedure Note   Informed consent was obtained and potential risks including bleeding, infection and pain were reviewed with the patient.  The patient's name, date of birth, identification, consent and allergies were verified prior to the start of procedure and time out was performed.  The left posterior iliac crest was chosen as the site of biopsy.  The skin was prepped with ChloraPrep.   15 cc of 1% lidocaine was used to provide local anaesthesia.   10 cc of bone marrow aspirate was obtained followed by 2 cm biopsy.  Pressure was applied to the biopsy site and bandage was placed over the biopsy site. Patient was made to lie on the back for 15 mins prior to discharge.  The procedure was tolerated well. COMPLICATIONS: None BLOOD LOSS: 2 cc The patient was discharged home in stable condition with a 1 week follow up to review results.  Patient was provided with post bone marrow biopsy instructions and instructed to call if there was any bleeding or worsening pain.  Specimens sent for flow cytometry, cytogenetics and additional studies.  Signed Harriette Ohara, MD

## 2020-08-27 NOTE — Progress Notes (Signed)
 Patient Care Team: Fry, Stephen A, MD as PCP - General (Family Medicine)  DIAGNOSIS:    ICD-10-CM   1. Leukopenia, unspecified type  D72.819 CBC with Differential (Cancer Center Only)    CHIEF COMPLIANT: Follow-up of leukopenia s/p bone marrow biopsy   INTERVAL HISTORY: Jerry Crawford is a 60 y.o. with above-mentioned history of leukopenia. He underwent a bone marrow biopsy on 08/21/20 and presents to the clinic today to review the pathology report.  He is very anxious today to know the results of his test.  He did not have any major pain or discomfort after he went home after the bone marrow biopsy.  ALLERGIES:  has No Known Allergies.  MEDICATIONS:  Current Outpatient Medications  Medication Sig Dispense Refill  . amLODipine (NORVASC) 5 MG tablet Take 1 tablet (5 mg total) by mouth daily. 90 tablet 3  . azelastine (OPTIVAR) 0.05 % ophthalmic solution Place 1 drop into both eyes 2 (two) times daily. 6 mL 11  . desonide (DESOWEN) 0.05 % lotion Apply topically 2 (two) times daily. 59 mL 11  . IBU 600 MG tablet Take 600 mg by mouth every 8 (eight) hours as needed for headache or moderate pain.  0  . loratadine (CLARITIN) 10 MG tablet Take 10 mg by mouth daily.    . Multiple Vitamin (MULTIVITAMIN WITH MINERALS) TABS Take 1 tablet by mouth daily.    . sildenafil (VIAGRA) 100 MG tablet TAKE 1 TABLET(100 MG) BY MOUTH DAILY AS NEEDED FOR ERECTILE DYSFUNCTION 6 tablet 11  . simvastatin (ZOCOR) 20 MG tablet TAKE 1 TABLET BY MOUTH EVERY DAY 30 tablet 11  . tacrolimus (PROTOPIC) 0.1 % ointment APPLY EXTERNALLY TO THE AFFECTED AREA TWICE DAILY 30 g 5  . tadalafil (CIALIS) 20 MG tablet TAKE 1 TABLET(20 MG) BY MOUTH DAILY AS NEEDED FOR ERECTILE DYSFUNCTION 10 tablet 6  . Tetrahydrozoline HCl (EYE DROPS OP) Apply 2 drops to eye as needed (for redness in eyes).    . triamcinolone cream (KENALOG) 0.1 % Apply 1 application topically 2 (two) times daily. 45 g 2  . triamterene-hydrochlorothiazide  (MAXZIDE-25) 37.5-25 MG tablet TAKE 1 TABLET BY MOUTH DAILY 30 tablet 4   Current Facility-Administered Medications  Medication Dose Route Frequency Provider Last Rate Last Admin  . 0.9 %  sodium chloride infusion  500 mL Intravenous Once Stark, Malcolm T, MD        PHYSICAL EXAMINATION: ECOG PERFORMANCE STATUS: 0 - Asymptomatic  Vitals:   08/28/20 0834  BP: 129/86  Pulse: 91  Resp: 18  Temp: 98.1 F (36.7 C)  SpO2: 100%   Filed Weights   08/28/20 0834  Weight: 209 lb 3.2 oz (94.9 kg)    LABORATORY DATA:  I have reviewed the data as listed CMP Latest Ref Rng & Units 06/26/2020 03/20/2020 06/24/2019  Glucose 70 - 99 mg/dL 106(H) 80 103(H)  BUN 6 - 23 mg/dL 15 16 13  Creatinine 0.40 - 1.50 mg/dL 0.96 0.98 0.90  Sodium 135 - 145 mEq/L 138 138 138  Potassium 3.5 - 5.1 mEq/L 4.1 3.9 3.7  Chloride 96 - 112 mEq/L 102 101 100  CO2 19 - 32 mEq/L 29 28 33(H)  Calcium 8.4 - 10.5 mg/dL 9.5 9.9 9.9  Total Protein 6.0 - 8.3 g/dL 6.5 6.9 7.1  Total Bilirubin 0.2 - 1.2 mg/dL 1.2 1.7(H) 1.5(H)  Alkaline Phos 39 - 117 U/L 49 - 51  AST 0 - 37 U/L 20 20 20  ALT 0 -   53 U/L _0 Lab Results  Component Value Date   WBC 3.0 (L) 08/14/2020   HGB 16.3 08/14/2020   HCT 48.1 08/14/2020   MCV 87.5 08/14/2020   PLT 196 08/14/2020   NEUTROABS 1.0 (L) 08/14/2020    ASSESSMENT & PLAN:  Leukopenia Lab review: 03/30/2012: WBC 4.8, ANC 2.5 ALC 2 06/15/2016: WBC 3.8 04/21/2017: WBC 3.8, ANC 1.4, ALC 1.8 06/05/2018: WBC 3.3 ANC 1.4, ALC 1.4 05/08/20: WBC 3.3, ANC 1.4, B 12: 234, Folate 23, ANA Neg 06/26/20: WBC 3, ANC 1.3 08/14/2020: WBC 3, ANC 1  Bone Marrow Biopsy: 08/20/20: Normocellular bone marrow 40 to 60% cellularity with trilineage hematopoiesis.  No dysplastic cells.  Cytogenetics and FISH are pending.  I will call him if there are any abnormalities in these tests.  He will come back in 3 months for labs and in 6 months for labs and follow-up with me.     Orders Placed This  Encounter  Procedures  . CBC with Differential (Cancer Center Only)    Standing Status:   Standing    Number of Occurrences:   20    Standing Expiration Date:   08/28/2021   The patient has a good understanding of the overall plan. he agrees with it. he will call with any problems that may develop before the next visit here.  Total time spent: 20 mins including face to face time and time spent for planning, charting and coordination of care  Rulon Eisenmenger, MD, MPH 08/28/2020  I, Molly Dorshimer, am acting as scribe for Dr. Nicholas Lose.  I have reviewed the above documentation for accuracy and completeness, and I agree with the above.

## 2020-08-27 NOTE — Assessment & Plan Note (Signed)
Lab review: 03/30/2012: WBC 4.8, ANC 2.5 ALC 2 06/15/2016: WBC 3.8 04/21/2017: WBC 3.8, ANC 1.4, ALC 1.8 06/05/2018: WBC 3.3 ANC 1.4, ALC 1.4 05/08/20: WBC 3.3, ANC 1.4, B 12: 234, Folate 23, ANA Neg 06/26/20: WBC 3, ANC 1.3 08/14/2020: WBC 3, ANC 1  Bone Marrow Biopsy: 08/20/20:

## 2020-08-28 ENCOUNTER — Other Ambulatory Visit: Payer: Self-pay

## 2020-08-28 ENCOUNTER — Telehealth: Payer: Self-pay | Admitting: Hematology and Oncology

## 2020-08-28 ENCOUNTER — Inpatient Hospital Stay: Payer: 59 | Admitting: Hematology and Oncology

## 2020-08-28 DIAGNOSIS — D72819 Decreased white blood cell count, unspecified: Secondary | ICD-10-CM

## 2020-08-28 NOTE — Telephone Encounter (Signed)
Scheduled appointment per 05/20 los. Patient is aware  

## 2020-09-01 ENCOUNTER — Encounter (HOSPITAL_COMMUNITY): Payer: Self-pay | Admitting: Hematology and Oncology

## 2020-09-02 ENCOUNTER — Encounter (HOSPITAL_COMMUNITY): Payer: Self-pay | Admitting: Hematology and Oncology

## 2020-09-10 LAB — SURGICAL PATHOLOGY

## 2020-11-10 ENCOUNTER — Other Ambulatory Visit: Payer: Self-pay | Admitting: Family Medicine

## 2020-11-19 ENCOUNTER — Other Ambulatory Visit: Payer: Self-pay | Admitting: Family Medicine

## 2020-11-27 ENCOUNTER — Inpatient Hospital Stay: Payer: 59

## 2020-12-04 ENCOUNTER — Other Ambulatory Visit: Payer: Self-pay

## 2020-12-04 ENCOUNTER — Inpatient Hospital Stay: Payer: 59 | Attending: Hematology and Oncology

## 2020-12-04 DIAGNOSIS — D72819 Decreased white blood cell count, unspecified: Secondary | ICD-10-CM | POA: Diagnosis not present

## 2020-12-04 LAB — CBC WITH DIFFERENTIAL (CANCER CENTER ONLY)
Abs Immature Granulocytes: 0.01 10*3/uL (ref 0.00–0.07)
Basophils Absolute: 0 10*3/uL (ref 0.0–0.1)
Basophils Relative: 1 %
Eosinophils Absolute: 0.1 10*3/uL (ref 0.0–0.5)
Eosinophils Relative: 3 %
HCT: 47.6 % (ref 39.0–52.0)
Hemoglobin: 16.3 g/dL (ref 13.0–17.0)
Immature Granulocytes: 0 %
Lymphocytes Relative: 44 %
Lymphs Abs: 1.5 10*3/uL (ref 0.7–4.0)
MCH: 30 pg (ref 26.0–34.0)
MCHC: 34.2 g/dL (ref 30.0–36.0)
MCV: 87.5 fL (ref 80.0–100.0)
Monocytes Absolute: 0.6 10*3/uL (ref 0.1–1.0)
Monocytes Relative: 16 %
Neutro Abs: 1.2 10*3/uL — ABNORMAL LOW (ref 1.7–7.7)
Neutrophils Relative %: 36 %
Platelet Count: 193 10*3/uL (ref 150–400)
RBC: 5.44 MIL/uL (ref 4.22–5.81)
RDW: 12.7 % (ref 11.5–15.5)
WBC Count: 3.5 10*3/uL — ABNORMAL LOW (ref 4.0–10.5)
nRBC: 0 % (ref 0.0–0.2)

## 2021-01-05 ENCOUNTER — Other Ambulatory Visit: Payer: Self-pay

## 2021-01-05 ENCOUNTER — Encounter: Payer: Self-pay | Admitting: Family Medicine

## 2021-01-05 ENCOUNTER — Ambulatory Visit: Payer: 59 | Admitting: Family Medicine

## 2021-01-05 VITALS — BP 120/78 | HR 99 | Temp 99.0°F | Wt 209.0 lb

## 2021-01-05 DIAGNOSIS — J302 Other seasonal allergic rhinitis: Secondary | ICD-10-CM | POA: Diagnosis not present

## 2021-01-05 DIAGNOSIS — M79604 Pain in right leg: Secondary | ICD-10-CM

## 2021-01-05 MED ORDER — AZELASTINE HCL 0.05 % OP SOLN: 1.0000 [drp] | Freq: Two times a day (BID) | OPHTHALMIC | 11 refills | Status: DC

## 2021-01-05 MED ORDER — TRAMADOL HCL 50 MG PO TABS: 50.0000 mg | ORAL_TABLET | Freq: Four times a day (QID) | ORAL | 1 refills | Status: DC | PRN

## 2021-01-05 NOTE — Progress Notes (Signed)
   Subjective:    Patient ID: Jerry Crawford, male    DOB: 07-15-1960, 60 y.o.   MRN: [id number]  HPI Here for 3 days of pain and swelling in the right leg. No SOB or chest pain. No back pain. No recent trauma, but the day this started he had been moving a clothes dryer on and off a truck. He has never had a blood clot. 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 Taking Tylenol and aspirin.    Review of Systems  Constitutional: Negative.   Respiratory: Negative.    Cardiovascular: Negative.   Musculoskeletal:  Positive for myalgias.      Objective:   Physical Exam Constitutional:      Comments: In pain, walks with a slight limp  Cardiovascular:     Rate and Rhythm: Normal rate and regular rhythm.     Pulses: Normal pulses.     Heart sounds: Normal heart sounds.  Pulmonary:     Effort: Pulmonary effort is normal.     Breath sounds: Normal breath sounds.  Musculoskeletal:     Comments: The entire right leg is slightly swollen and tender, especially in the right calf. No warmth or erythema. No cords are felt. Homan's is positive on the right side.  Neurological:     Mental Status: He is alert.          Assessment & Plan:  Sudden onset of pain and swelling in the right leg. I am concerned about a possible DVT. We will set up a venous doppler on the leg asap. He is written out of work the rest of this week.  Alysia Penna, MD

## 2021-01-06 ENCOUNTER — Telehealth: Payer: Self-pay

## 2021-01-06 ENCOUNTER — Ambulatory Visit (HOSPITAL_COMMUNITY)
Admission: RE | Admit: 2021-01-06 | Discharge: 2021-01-06 | Disposition: A | Discharge status: Home / Self Care | Payer: 59 | Source: Ambulatory Visit | Attending: Family Medicine | Admitting: Family Medicine

## 2021-01-06 ENCOUNTER — Telehealth: Payer: Self-pay | Admitting: Family Medicine

## 2021-01-06 DIAGNOSIS — M79604 Pain in right leg: Secondary | ICD-10-CM | POA: Diagnosis present

## 2021-01-06 NOTE — Telephone Encounter (Signed)
[facility] called to give report no DVT and prior history GSV abrasion  Electronic report sent

## 2021-01-06 NOTE — Telephone Encounter (Signed)
Pt call and stated he want dr.Fry to give him a call he stated he was here yesterday and his leg is still hurting .

## 2021-01-06 NOTE — Telephone Encounter (Signed)
Spoke with pt advised that his message was sent to Dr Sarajane Jews for advise, pt is aware that he will get a call when Dr Sarajane Jews responds to his message

## 2021-01-06 NOTE — Telephone Encounter (Signed)
Reviewed [place] report with pt, verbalized undersatnding

## 2021-01-07 ENCOUNTER — Telehealth: Payer: Self-pay | Admitting: Family Medicine

## 2021-01-07 DIAGNOSIS — M791 Myalgia, unspecified site: Secondary | ICD-10-CM

## 2021-01-07 NOTE — Telephone Encounter (Signed)
I put in lab orders to check for several things. He can make a lab appt

## 2021-01-07 NOTE — Telephone Encounter (Signed)
Called patient, he stated that after his appointment on 01/08/21, he will go to lab for blood drawn

## 2021-01-07 NOTE — Telephone Encounter (Signed)
PT called to advise that he would like to do labs today to find out what is going on with his legs. Please advise as I saw no labs on file.

## 2021-01-07 NOTE — Telephone Encounter (Signed)
Last OV- 01/05/21.   See telephone messages from 01/06/21

## 2021-01-07 NOTE — Addendum Note (Signed)
Addended by: Alysia Penna A on: 01/07/2021 11:57 AM   Modules accepted: Orders

## 2021-01-08 ENCOUNTER — Ambulatory Visit (INDEPENDENT_AMBULATORY_CARE_PROVIDER_SITE_OTHER): Payer: 59 | Admitting: Family Medicine

## 2021-01-08 ENCOUNTER — Ambulatory Visit: Payer: 59 | Admitting: Family Medicine

## 2021-01-08 ENCOUNTER — Other Ambulatory Visit: Payer: Self-pay

## 2021-01-08 ENCOUNTER — Encounter: Payer: Self-pay | Admitting: Family Medicine

## 2021-01-08 VITALS — BP 134/86 | HR 87 | Temp 98.5°F | Wt 209.0 lb

## 2021-01-08 DIAGNOSIS — S76301D Unspecified injury of muscle, fascia and tendon of the posterior muscle group at thigh level, right thigh, subsequent encounter: Secondary | ICD-10-CM | POA: Diagnosis not present

## 2021-01-08 MED ORDER — TACROLIMUS 0.1 % EX OINT: TOPICAL_OINTMENT | CUTANEOUS | 5 refills | Status: DC

## 2021-01-08 NOTE — Progress Notes (Signed)
   Subjective:    Patient ID: Jerry Crawford, male    DOB: Jun 24, 1960, 60 y.o.   MRN: [id number]  HPI Here to follow up on pain in the right leg. This started after moved a dryer. We saw him on 01-05-21 and we were concerned about a possible DVT. He had a venous doppler on 01-06-21 that was normal. Since then he has continued to have severe pain which is centered in the posterior right thigh. He is using Tramadol for the pain.    Review of Systems  Constitutional: Negative.   Respiratory: Negative.    Cardiovascular: Negative.   Musculoskeletal:  Positive for myalgias.      Objective:   Physical Exam Constitutional:      Comments: He is in pain, walks with a limp  Cardiovascular:     Rate and Rhythm: Normal rate and regular rhythm.     Pulses: Normal pulses.     Heart sounds: Normal heart sounds.  Pulmonary:     Effort: Pulmonary effort is normal.     Breath sounds: Normal breath sounds.  Musculoskeletal:     Comments: The right leg appears norma. He is tender in the posterior right thigh, especially just inferior to the right buttock. ROM of the leg is full, but extension of the right hip causes pain  Neurological:     Mental Status: He is alert.          Assessment & Plan:  Right leg pain, possible hamstring injury. We will extend his time out of work through 01-24-21. He can use ice and Tramadol for pain. Refer to Sports Medicine.  Alysia Penna, MD

## 2021-01-11 ENCOUNTER — Ambulatory Visit (INDEPENDENT_AMBULATORY_CARE_PROVIDER_SITE_OTHER): Payer: 59

## 2021-01-11 ENCOUNTER — Ambulatory Visit: Payer: 59 | Admitting: Sports Medicine

## 2021-01-11 ENCOUNTER — Ambulatory Visit: Payer: 59 | Admitting: Family Medicine

## 2021-01-11 ENCOUNTER — Ambulatory Visit: Payer: Self-pay

## 2021-01-11 ENCOUNTER — Other Ambulatory Visit: Payer: Self-pay

## 2021-01-11 VITALS — BP 130/86 | HR 83 | Ht 71.0 in | Wt 210.0 lb

## 2021-01-11 DIAGNOSIS — M79604 Pain in right leg: Secondary | ICD-10-CM

## 2021-01-11 DIAGNOSIS — S76311A Strain of muscle, fascia and tendon of the posterior muscle group at thigh level, right thigh, initial encounter: Secondary | ICD-10-CM

## 2021-01-11 MED ORDER — MELOXICAM 15 MG PO TABS: 15.0000 mg | ORAL_TABLET | Freq: Every day | ORAL | 0 refills | Status: DC

## 2021-01-11 NOTE — Patient Instructions (Addendum)
Good to see you  Crutches given  Weight baring as tolerated for one week slowly progress second week Meloxicam 15mg  daily for two weeks  See me again in 2 weeks but before the 17th

## 2021-01-11 NOTE — Progress Notes (Signed)
Benito Mccreedy D.[place] Phone: [phone]   Assessment and Plan:    1. Right leg pain 2. Strain of right hamstring muscle, initial encounter -Acute, no improvement, initial sports medicine visit - Likely strain of right hamstring muscle based on HPI, physical exam, x-ray and ultrasound - Limit weightbearing for the next 1 week.  Crutches provided.  May slowly weight-bear as tolerated - Start meloxicam 15 mg daily x2 weeks - X-ray obtained in clinic.  My interpretation: No acute fracture, joint space maintained, no significant cortical irregularities - [place] LIMITED JOINT SPACE STRUCTURES LOW RIGHT(NO LINKED CHARGES); Future - DG HIP UNILAT WITH PELVIS 2-3 VIEWS RIGHT; Future -Patient previously provided with work note to be off for 2 weeks.  I agree with this timeframe.  Follow-up with me before that date to see if patient needs to be out for additional time    Pertinent previous records reviewed include pcp note   Follow Up: In 2 weeks for reevaluation. -Patient previously provided with work note to be off for 2 weeks.  I agree with this timeframe.  Follow-up with me before that date to see if patient needs to be out for additional time.  Would consider advanced imaging versus PT at that time.  Can repeat ultrasound   Subjective:   I, Jerry Crawford, am serving as a scribe for Dr. Glennon Mac  Chief Complaint: Right hamstring pain   HPI:   01/11/21 Patient is a 60 year old male presenting with right hamstring pain that has been going on for about a week after moving a dryer on and off of a truck. Patient was seen by his PCP on 01/05/21 3 days after the incident and complained of pain and swelling in the R hamstring. Patient was sent for a venous doppler that came back normal. Patient followed up with PCP on 01/08/21 with complaints of severe pain which is located in the posterior right thigh that is worse  with extension of the right hip, patient is using tramadol and ice for pain relief. Patient states the pain starts in his R lower back that radiates down his hamstring to calf then to the shin. Patient states that while he was moving the dryer her felt a pop and had pain initially but went away. When patient went back to work the next day he realized he was actually in a lot of pain.   Relevant Historical Information: none pertinent   Additional pertinent review of systems negative.   Current Outpatient Medications:    amLODipine (NORVASC) 5 MG tablet, Take 1 tablet (5 mg total) by mouth daily., Disp: 90 tablet, Rfl: 3   azelastine (OPTIVAR) 0.05 % ophthalmic solution, Place 1 drop into both eyes 2 (two) times daily., Disp: 6 mL, Rfl: 11   desonide (DESOWEN) 0.05 % lotion, Apply topically 2 (two) times daily., Disp: 59 mL, Rfl: 11   IBU 600 MG tablet, Take 600 mg by mouth every 8 (eight) hours as needed for headache or moderate pain., Disp: , Rfl: 0   loratadine (CLARITIN) 10 MG tablet, Take 10 mg by mouth daily., Disp: , Rfl:    meloxicam (MOBIC) 15 MG tablet, Take 1 tablet (15 mg total) by mouth daily., Disp: 30 tablet, Rfl: 0   Multiple Vitamin (MULTIVITAMIN WITH MINERALS) TABS, Take 1 tablet by mouth daily., Disp: , Rfl:    sildenafil (VIAGRA) 100 MG tablet, TAKE 1 TABLET(100 MG) BY MOUTH  DAILY AS NEEDED FOR ERECTILE DYSFUNCTION, Disp: 6 tablet, Rfl: 11   simvastatin (ZOCOR) 20 MG tablet, TAKE 1 TABLET BY MOUTH EVERY DAY, Disp: 30 tablet, Rfl: 11   tacrolimus (PROTOPIC) 0.1 % ointment, APPLY TOPICALLY TO THE AFFECTED AREA TWICE DAILY, Disp: 60 g, Rfl: 5   tadalafil (CIALIS) 20 MG tablet, TAKE 1 TABLET(20 MG) BY MOUTH DAILY AS NEEDED FOR ERECTILE DYSFUNCTION, Disp: 10 tablet, Rfl: 6   Tetrahydrozoline HCl (EYE DROPS OP), Apply 2 drops to eye as needed (for redness in eyes)., Disp: , Rfl:    traMADol (ULTRAM) 50 MG tablet, Take 1 tablet (50 mg total) by mouth every 6 (six) hours as needed.,  Disp: 60 tablet, Rfl: 1   triamcinolone cream (KENALOG) 0.1 %, Apply 1 application topically 2 (two) times daily., Disp: 45 g, Rfl: 2   triamterene-hydrochlorothiazide (MAXZIDE-25) 37.5-25 MG tablet, TAKE 1 TABLET BY MOUTH DAILY, Disp: 30 tablet, Rfl: 4  Current Facility-Administered Medications:    0.9 %  sodium chloride infusion, 500 mL, Intravenous, Once, Ladene Artist, MD   Objective:     Vitals:   01/11/21 0935  BP: 130/86  Pulse: 83  SpO2: 97%  Weight: 210 lb (95.3 kg)  Height: 5\' 11"  (1.803 m)      Body mass index is 29.29 kg/m.    Physical Exam:    General: awake, alert, and oriented no acute distress, nontoxic Skin: no suspicious lesions or rashes Neuro:sensation intact distally with no dificits, normal muscle tone, no atrophy, strength 5/5 in all tested lower ext groups Psych: normal mood and affect, speech clear  Right Hip: No deformity, swelling or wasting ROM Fexion 90, ext 30, IR 45, ER 45 TTP hamstrings, most prominent hamstring origin and proximal hamstring NTTP over the hip flexors, adductors, inguinal canal, pubic ramus, greater troch, glute musculature, si joint, lumbar spine Negative log roll with FROM Negative FABER Negative FADIR Negative Piriformis test Negative trendelenberg Gait normal Pain with resisted knee flexion, worse with knee flexion and hip internal rotation  Sports Medicine: Musculoskeletal Ultrasound. Exam: Limited US of right hamstring Diagnosis: right hamstring pain  US Findings: No muscular or tendinous abnormality seen at hamstring origin.  Areas of muscular disorganization with hypoechoic pockets coinciding with patient's area of tenderness  US Impression:  Hamstring muscle strain.  No significant tearing seen on ultrasound.   Electronically signed by:  Benito Mccreedy D.[facility] 10:43 AM 01/11/21

## 2021-01-20 NOTE — Progress Notes (Signed)
Jerry Crawford D.[place] Phone: [phone]   Assessment and Plan:     1. Strain of right hamstring muscle, subsequent encounter 2. Hamstring tear -Acute, mild improvement, subsequent sports medicine visit - Likely proximal hamstring strain versus incomplete tear based on physical exam, HPI, repeat ultrasound - Start physical therapy for partial hamstring tear - Continue meloxicam 15 mg daily as needed for pain control - Continue using crutches with gradual progression to weightbearing as tolerated - Ambulatory referral to Physical Therapy    Pertinent previous records reviewed include none pertinent   Follow Up: In 2 weeks for reevaluation.  Could consider repeat ultrasound versus MRI   Subjective:   I, Judy Pimple, am serving as a scribe for Dr. Glennon Mac  Chief Complaint: Right hamstring pain follow up  HPI:   01/11/21 Patient is a 60 year old male presenting with right hamstring pain that has been going on for about a week after moving a dryer on and off of a truck. Patient was seen by his PCP on 01/05/21 3 days after the incident and complained of pain and swelling in the R hamstring. Patient was sent for a venous doppler that came back normal. Patient followed up with PCP on 01/08/21 with complaints of severe pain which is located in the posterior right thigh that is worse with extension of the right hip, patient is using tramadol and ice for pain relief. Patient states the pain starts in his R lower back that radiates down his hamstring to calf then to the shin. Patient states that while he was moving the dryer her felt a pop and had pain initially but went away. When patient went back to work the next day he realized he was actually in a lot of pain.  01/21/21 Patient states that he is still having the pain in his right hamstring and now has noticed that the pain has started to radiate the  right upper outer thigh. Patient states that his shin still feels like it is swelling especially at night and after the medication wears off. Patient states that he has tried to put pressure on it but cannot so he has not been weight bearing. Patient does not think he will be able to return to work.   Relevant Historical Information: Bilateral varicose veins.  Recent negative Doppler ultrasound lower extremity  Additional pertinent review of systems negative.   Current Outpatient Medications:    amLODipine (NORVASC) 5 MG tablet, Take 1 tablet (5 mg total) by mouth daily., Disp: 90 tablet, Rfl: 3   azelastine (OPTIVAR) 0.05 % ophthalmic solution, Place 1 drop into both eyes 2 (two) times daily., Disp: 6 mL, Rfl: 11   desonide (DESOWEN) 0.05 % lotion, Apply topically 2 (two) times daily., Disp: 59 mL, Rfl: 11   IBU 600 MG tablet, Take 600 mg by mouth every 8 (eight) hours as needed for headache or moderate pain., Disp: , Rfl: 0   loratadine (CLARITIN) 10 MG tablet, Take 10 mg by mouth daily., Disp: , Rfl:    meloxicam (MOBIC) 15 MG tablet, Take 1 tablet (15 mg total) by mouth daily., Disp: 30 tablet, Rfl: 0   Multiple Vitamin (MULTIVITAMIN WITH MINERALS) TABS, Take 1 tablet by mouth daily., Disp: , Rfl:    sildenafil (VIAGRA) 100 MG tablet, TAKE 1 TABLET(100 MG) BY MOUTH DAILY AS NEEDED FOR ERECTILE DYSFUNCTION, Disp: 6 tablet, Rfl: 11   simvastatin (ZOCOR) 20  MG tablet, TAKE 1 TABLET BY MOUTH EVERY DAY, Disp: 30 tablet, Rfl: 11   tacrolimus (PROTOPIC) 0.1 % ointment, APPLY TOPICALLY TO THE AFFECTED AREA TWICE DAILY, Disp: 60 g, Rfl: 5   tadalafil (CIALIS) 20 MG tablet, TAKE 1 TABLET(20 MG) BY MOUTH DAILY AS NEEDED FOR ERECTILE DYSFUNCTION, Disp: 10 tablet, Rfl: 6   Tetrahydrozoline HCl (EYE DROPS OP), Apply 2 drops to eye as needed (for redness in eyes)., Disp: , Rfl:    traMADol (ULTRAM) 50 MG tablet, Take 1 tablet (50 mg total) by mouth every 6 (six) hours as needed., Disp: 60 tablet, Rfl: 1    triamcinolone cream (KENALOG) 0.1 %, Apply 1 application topically 2 (two) times daily., Disp: 45 g, Rfl: 2   triamterene-hydrochlorothiazide (MAXZIDE-25) 37.5-25 MG tablet, TAKE 1 TABLET BY MOUTH DAILY, Disp: 30 tablet, Rfl: 4  Current Facility-Administered Medications:    0.9 %  sodium chloride infusion, 500 mL, Intravenous, Once, Ladene Artist, MD   Objective:     Vitals:   01/21/21 0924  BP: 120/80  Pulse: 95  SpO2: 99%  Weight: 210 lb (95.3 kg)  Height: 5\' 11"  (1.803 m)      Body mass index is 29.29 kg/m.    Physical Exam:    General: awake, alert, and oriented no acute distress, nontoxic Skin: no suspicious lesions or rashes Neuro:sensation intact distally with no dificits, normal muscle tone, no atrophy, strength 5/5 in all tested lower ext groups Psych: normal mood and affect, speech clear   Right Hip: No deformity, swelling or wasting ROM Fexion 90, ext 30, IR 45, ER 45 TTP hamstrings, most prominent hamstring origin and proximal hamstring NTTP over the hip flexors, adductors, inguinal canal, pubic ramus, greater troch, glute musculature, si joint, lumbar spine Negative log roll with FROM Negative FABER Negative FADIR Negative Piriformis test Negative trendelenberg Gait normal Pain with resisted knee flexion, worse with knee flexion and hip internal rotation   Sports Medicine: Musculoskeletal Ultrasound. Exam: Limited US of right hamstring Diagnosis: right hamstring pain   US Findings: No muscular or tendinous abnormality seen at hamstring origin.  Areas of muscular disorganization with hypoechoic pockets and proximal hamstring coinciding with patient's area of tenderness   US Impression:  1. Hamstring muscle strain versus partial tear.  No significant tearing seen on ultrasound.   Electronically signed by:  Jerry Crawford D.[facility] 9:59 AM 01/21/21

## 2021-01-21 ENCOUNTER — Ambulatory Visit: Payer: 59 | Admitting: Sports Medicine

## 2021-01-21 ENCOUNTER — Ambulatory Visit: Payer: Self-pay

## 2021-01-21 ENCOUNTER — Other Ambulatory Visit: Payer: Self-pay

## 2021-01-21 VITALS — BP 120/80 | HR 95 | Ht 71.0 in | Wt 210.0 lb

## 2021-01-21 DIAGNOSIS — S76319A Strain of muscle, fascia and tendon of the posterior muscle group at thigh level, unspecified thigh, initial encounter: Secondary | ICD-10-CM | POA: Diagnosis not present

## 2021-01-21 DIAGNOSIS — S76311D Strain of muscle, fascia and tendon of the posterior muscle group at thigh level, right thigh, subsequent encounter: Secondary | ICD-10-CM | POA: Diagnosis not present

## 2021-01-21 NOTE — Patient Instructions (Addendum)
Good to see you  Work note to be out two weeks given  Physical therapy referral placed  Can continue the rest of the meloxicam  See me again in 2 weeks

## 2021-01-28 ENCOUNTER — Other Ambulatory Visit: Payer: Self-pay

## 2021-01-28 ENCOUNTER — Ambulatory Visit: Payer: 59 | Attending: Sports Medicine | Admitting: Physical Therapy

## 2021-01-28 ENCOUNTER — Encounter: Payer: Self-pay | Admitting: Physical Therapy

## 2021-01-28 DIAGNOSIS — M79604 Pain in right leg: Secondary | ICD-10-CM | POA: Insufficient documentation

## 2021-01-28 DIAGNOSIS — M6281 Muscle weakness (generalized): Secondary | ICD-10-CM | POA: Diagnosis present

## 2021-01-28 DIAGNOSIS — S76319A Strain of muscle, fascia and tendon of the posterior muscle group at thigh level, unspecified thigh, initial encounter: Secondary | ICD-10-CM | POA: Insufficient documentation

## 2021-01-28 DIAGNOSIS — R2689 Other abnormalities of gait and mobility: Secondary | ICD-10-CM | POA: Diagnosis present

## 2021-01-28 DIAGNOSIS — M79651 Pain in right thigh: Secondary | ICD-10-CM | POA: Diagnosis present

## 2021-01-28 NOTE — Patient Instructions (Signed)
Access Code: [email or web address] URL: [email or web address] Date: 01/28/2021 Prepared by: Hilda Blades  Exercises Supine Heel Slide - 3 x daily - 7 x weekly - 10 reps - 5 hold Supine Isometric Hamstring Set - 3 x daily - 7 x weekly - 10 reps - 5 hold Prone Hip Extension - 2-3 x daily - 7 x weekly - 10 reps - 3 hold Prone Knee Flexion AROM - 2-3 x daily - 7 x weekly - 10 reps - 3 hold Seated Heel Toe Raises - 2-3 x daily - 7 x weekly - 20 reps - 3 hold

## 2021-01-28 NOTE — Therapy (Signed)
[place], [place], [address] Phone: [phone]   Fax:  [phone]  Physical Therapy Evaluation  Patient Details  Name: Jerry Crawford MRN: [id number] Date of Birth: [birth year] Referring Provider (PT): Glennon Mac, DO   Encounter Date: 01/28/2021   PT End of Session - 01/28/21 0929     Visit Number 1    Number of Visits 12    Date for PT Re-Evaluation 03/11/21    Authorization Type [employer]    PT Start Time 0915    PT Stop Time 1000    PT Time Calculation (min) 45 min    Activity Tolerance Patient tolerated treatment well    Behavior During Therapy [facility] for tasks assessed/performed             Past Medical History:  Diagnosis Date   Allergy    Hypercholesteremia    Hyperlipidemia    Hypertension     Past Surgical History:  Procedure Laterality Date   COLONOSCOPY  06/02/2017   per Dr. Fuller Plan, adenomatous polyp, repeat in 5 yrs    ENDOVENOUS ABLATION SAPHENOUS VEIN W/ LASER Left 11/08/2016   endovenous laser ablation left small saphenous vein by Tinnie Gens MD    ENDOVENOUS [place] W/ LASER Left 11/23/2016   endovenous laser ablation left greater saphenous vein and stab phlebectomy > 20 incisions left leg by Tinnie Gens MD   ENDOVENOUS [place] W/ LASER Right 12/26/2016   endovenous laser ablation R greater saphenous vein and stab phlebectomy > 20 incisions R leg by Tinnie Gens MD     There were no vitals filed for this visit.    Subjective Assessment - 01/28/21 0917     Subjective Patient reports on he was moving a dryer and began having pain throughout the right leg. He rested the dry on the front of the right shin/foot and felt a pop in the right lower back region which is when pain began. Since that time the pain is mainly located to the back of the right thigh and will also have pain in the right calf and front of shin. He was on crutches but  currently using the cane for walking. Patient reports he also gets woken at night because of the pain. He denies any current low back pain or numbness/tingling.    Limitations Sitting;Lifting;Standing;Walking;House hold activities    How long can you walk comfortably? "all walking is uncomfortable"    Patient Stated Goals Pain relief and get back to work    Currently in Pain? Yes    Pain Score 6     Pain Location Leg   mainly posterior thigh   Pain Orientation Left    Pain Descriptors / Indicators Tightness;Aching;Throbbing    Pain Type Acute pain    Pain Radiating Towards will occasionally have pain right calf and anterior shin    Pain Onset More than a month ago    Pain Frequency Constant    Aggravating Factors  Standing, walking, bending, pain at night that wakes from sleep    Pain Relieving Factors Medication                [facility] PT Assessment - 01/28/21 0001       Assessment   Medical Diagnosis Right hamstring strain    Referring Provider (PT) Glennon Mac, DO    Onset Date/Surgical Date 01/03/21    Next MD Visit 02/04/2021    Prior Therapy No  Precautions   Precautions None      Restrictions   Weight Bearing Restrictions No      Balance Screen   Has the patient fallen in the past 6 months No    Has the patient had a decrease in activity level because of a fear of falling?  No    Is the patient reluctant to leave their home because of a fear of falling?  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, pulling and stacking palates, walking around machine   lifting about 30 pounds     Cognition   Overall Cognitive Status Within Functional Limits for  tasks assessed      Observation/Other Assessments   Observations Patient appears in no apparent distress    Focus on Therapeutic Outcomes (FOTO)  44% functional status      Sensation   Light Touch Appears Intact      Coordination   Gross Motor Movements are Fluid and Coordinated Yes      ROM / Strength   AROM / PROM / Strength AROM;PROM;Strength      AROM   Overall AROM Comments Lumbar screen grossly negative, difficult to accurately assess due to hamstring    AROM Assessment Site Knee    Right/Left Knee Right;Left    Right Knee Extension 0    Right Knee Flexion 125    Left Knee Extension 0    Left Knee Flexion 130      PROM   Overall PROM Comments Knee PROM grossly equal bilaterally      Strength   Overall Strength Comments Patient only asked to move right hamstring against gravity, no manual pressure applied    Strength Assessment Site Hip;Knee;Ankle    Right/Left Hip Right;Left    Right Hip Flexion 4-/5    Right Hip Extension 3/5    Right Hip ABduction 4-/5    Left Hip Flexion 4+/5    Left Hip Extension 4/5    Left Hip ABduction 4/5    Right/Left Knee Right;Left    Right Knee Flexion 3/5    Right Knee Extension 4/5    Left Knee Flexion 5/5    Left Knee Extension 5/5    Right/Left Ankle Right;Left    Right Ankle Dorsiflexion 5/5    Right Ankle Plantar Flexion 5/5    Right Ankle Inversion 5/5    Right Ankle Eversion 5/5    Left Ankle Dorsiflexion 5/5    Left Ankle Plantar Flexion 5/5    Left Ankle Inversion 5/5    Left Ankle Eversion 5/5      Flexibility   Soft Tissue Assessment /Muscle Length yes    Hamstrings Limited on right with report of anterior/lateral shin discomfort and hamstring tightness      Palpation   Spinal mobility WFL, patient reported localized low back discomfort with lower lumbar CPAs    SI assessment  Not tested    Palpation comment TTP proximal half right hamstring, anterior and lateral shin region      Ambulation/Gait    Ambulation/Gait Yes    Ambulation/Gait Assistance 6: Modified  independent (Device/Increase time)    Assistive device Straight cane    Gait Comments Patient abulating with SPC in right hand, antalgic gait on right with right trunk lean over cane in stance                        Objective measurements completed on examination: See above findings.      [place] Adult PT Treatment/Exercise:  Therapeutic Exercise: - Heel slide 10 x 5 sec - Supine hamstring isometric 10 x 5 sec - Prone hip extension 10 x 3 sec - Prone hamstring curl 10 x 3 sec - Seated heel toe raises 20 x 3 sec  Gait training: - Instructed on proper use of SPC on left side to support right leg, heel toe progression with shorter step length to reduce stress on hamstring  Manual Therapy: - n/a  Neuromuscular re-ed: - n/a  Therapeutic Activity: - n/a  Modalities: - n/a           PT Education - 01/28/21 7096     Education Details Exam findings, POC, HEP    Person(s) Educated Patient    Methods Explanation;Demonstration;Verbal cues    Comprehension Verbalized understanding;Returned demonstration;Verbal cues required;Need further instruction              PT Short Term Goals - 01/28/21 0930       PT SHORT TERM GOAL #1   Title Patient will be I with initial HEP to progress with PT    Time 3    Period Weeks    Status New    Target Date 02/18/21      PT SHORT TERM GOAL #2   Title PT will review FOTO with patient to understand expected progress with therapy    Time 3    Period Weeks    Status New    Target Date 02/18/21      PT SHORT TERM GOAL #3   Title Patient will be able to ambulate community level distances without AD    Time 3    Period Weeks    Status New    Target Date 02/18/21               PT Long Term Goals - 01/28/21 0932       PT LONG TERM GOAL #1   Title Patient will be I with final HEP to maintain progress from PT    Time 6    Period Weeks     Status New    Target Date 03/11/21      PT LONG TERM GOAL #2   Title Patient will report >/= 66% status on FOTO to indicate improve funtional ability    Time 6    Period Weeks    Status New    Target Date 03/11/21      PT LONG TERM GOAL #3   Title Patient will exhibit 5/5 MMT knee strength and >/= 4/5 MMT hip strength to allow for return to work without limitation    Time 6    Period Weeks    Status New    Target Date 03/11/21      PT LONG TERM GOAL #4   Title Patient will be able to lift >/= 30lbs from floor without increase in pain t allow for return to work    Time 6    Period Weeks    Status New    Target Date 03/11/21  Plan - 01/28/21 1107     Clinical Impression Statement Patient presents to PT with report of acute right leg pain, primarily in the proximal hamstring region but with pain also in the right calf and anterior/lateral lower leg. His symtpoms are consistent with hamstring strain, and it is unclear etiology of lower leg pain but could be muscular strain vs radicular symptoms vs pain due to change in walking/movement pattern from hamstring pain. His lumbar screen was grossly [facility] this visit. Patient does exhibit slight limitation in knee motion and strength deficit of the RLE, he is ambulating using SPC and required gait training for proper use. He was provided exercises to initiate light AROM for the right knee, hamstring/hip activation and light isometrics. Patient would benefit from continued skilled PT to progress his mobility and strength in order to reduce pain and maximize functional ability with return to work.    Personal Factors and Comorbidities Past/Current Experience;Time since onset of injury/illness/exacerbation    Examination-Activity Limitations Locomotion Level;Sleep;Squat;Stairs;Stand;Lift;Bend    Examination-Participation Restrictions Meal Prep;Cleaning;Occupation;Community Activity;Shop;Laundry;Yard Work     Stability/Clinical Decision Making Stable/Uncomplicated    Designer, jewellery Low    Rehab Potential Good    PT Frequency 2x / week    PT Duration 6 weeks    PT Treatment/Interventions ADLs/Self Care Home Management;Aquatic Therapy;Cryotherapy;Electrical Stimulation;Iontophoresis 4mg /ml Dexamethasone;Moist Heat;Traction;Ultrasound;Neuromuscular re-education;Balance training;Therapeutic exercise;Therapeutic activities;Functional mobility training;Stair training;Gait training;Patient/family education;Manual techniques;Dry needling;Passive range of motion;Taping;Vasopneumatic Device;Spinal Manipulations;Joint Manipulations    PT Next Visit Plan Review HEP and progress PRN, recumbent bike, gentle stretching for hamstring, light hamstring and hip strengthening, gait training    PT Home Exercise Plan [email or web address]    Consulted and Agree with Plan of Care Patient             Patient will benefit from skilled therapeutic intervention in order to improve the following deficits and impairments:  Abnormal gait, Decreased range of motion, Difficulty walking, Pain, Decreased activity tolerance, Decreased strength, Impaired flexibility  Visit Diagnosis: Pain in right leg  Muscle weakness (generalized)  Other abnormalities of gait and mobility     Problem List Patient Active Problem List   Diagnosis Date Noted   Leukopenia 05/08/2020   Erectile dysfunction 09/01/2017   Premature ejaculation 03/13/2017   Seasonal allergies 07/29/2016   Hypertension 07/29/2016   Decreased testosterone level 06/15/2016   Ganglion cyst of wrist, left 06/15/2016   Elevated fasting blood sugar 06/15/2016   Varicose veins of bilateral lower extremities with other complications 25/63/8937    Hilda Blades, PT, DPT, LAT, ATC 01/28/21  1:35 PM Phone: [phone] Fax: [place] [facility] [address] [place], [place], [address] Phone: [phone]    Fax:  [phone]  Name: Jerry Crawford MRN: [id number] Date of Birth: Dec 06, 1960

## 2021-02-04 ENCOUNTER — Other Ambulatory Visit: Payer: Self-pay

## 2021-02-04 ENCOUNTER — Ambulatory Visit: Payer: 59 | Admitting: Sports Medicine

## 2021-02-04 VITALS — BP 130/90 | HR 88 | Ht 71.0 in | Wt 222.0 lb

## 2021-02-04 DIAGNOSIS — M79604 Pain in right leg: Secondary | ICD-10-CM

## 2021-02-04 DIAGNOSIS — S76319A Strain of muscle, fascia and tendon of the posterior muscle group at thigh level, unspecified thigh, initial encounter: Secondary | ICD-10-CM | POA: Diagnosis not present

## 2021-02-04 DIAGNOSIS — S76311D Strain of muscle, fascia and tendon of the posterior muscle group at thigh level, right thigh, subsequent encounter: Secondary | ICD-10-CM | POA: Diagnosis not present

## 2021-02-04 NOTE — Patient Instructions (Addendum)
Good to see you  Work note given  Continue PT and home exercises Continue meloxicam as needed  See me again in 2 weeks

## 2021-02-04 NOTE — Progress Notes (Signed)
Jerry Crawford D.[place] Phone: [phone]   Assessment and Plan:     1. Strain of right hamstring muscle, subsequent encounter 2. Hamstring tear 3. Right leg pain -Acute, mild improvement, subsequent sports medicine visit - Likely proximal hamstring strain versus incomplete tear based on physical exam, HPI, prior ultrasounds - Newer generalized right leg muscular complaints are likely due to compensation.  I expect that they will continue to improve with meloxicam, rehab - Continue physical therapy - Continue meloxicam 15 mg as needed for pain control - Continue using 1 pronged cane and gradually discontinue with goal of no pain with weightbearing   Pertinent previous records reviewed include PT note 10/20   Follow Up: In 2 weeks for reevaluation.  If no improvement or worsening symptoms would consider MRI.  Could consider repeat ultrasound   Subjective:   I, Judy Pimple, am serving as a scribe for Dr. Glennon Mac  Chief Complaint: right hamstring pain   HPI:   01/11/21 Patient is a 60 year old male presenting with right hamstring pain that has been going on for about a week after moving a dryer on and off of a truck. Patient was seen by his PCP on 01/05/21 3 days after the incident and complained of pain and swelling in the R hamstring. Patient was sent for a venous doppler that came back normal. Patient followed up with PCP on 01/08/21 with complaints of severe pain which is located in the posterior right thigh that is worse with extension of the right hip, patient is using tramadol and ice for pain relief. Patient states the pain starts in his R lower back that radiates down his hamstring to calf then to the shin. Patient states that while he was moving the dryer her felt a pop and had pain initially but went away. When patient went back to work the next day he realized he was actually in a lot of  pain.   01/21/21 Patient states that he is still having the pain in his right hamstring and now has noticed that the pain has started to radiate the right upper outer thigh. Patient states that his shin still feels like it is swelling especially at night and after the medication wears off. Patient states that he has tried to put pressure on it but cannot so he has not been weight bearing. Patient does not think he will be able to return to work.   02/04/21 Patient states that he is doing better, still has pain in the upper R hamstring and lateral R leg radiating down the front. Patient is able to bear weight now, is using a cane as recommended by PT. Patient is walking but is still limping.   Relevant Historical Information: Bilateral varicose veins.  Recent negative Doppler ultrasound lower extremity  Additional pertinent review of systems negative.   Current Outpatient Medications:    amLODipine (NORVASC) 5 MG tablet, Take 1 tablet (5 mg total) by mouth daily., Disp: 90 tablet, Rfl: 3   azelastine (OPTIVAR) 0.05 % ophthalmic solution, Place 1 drop into both eyes 2 (two) times daily., Disp: 6 mL, Rfl: 11   desonide (DESOWEN) 0.05 % lotion, Apply topically 2 (two) times daily., Disp: 59 mL, Rfl: 11   IBU 600 MG tablet, Take 600 mg by mouth every 8 (eight) hours as needed for headache or moderate pain., Disp: , Rfl: 0   loratadine (CLARITIN) 10 MG tablet,  Take 10 mg by mouth daily., Disp: , Rfl:    meloxicam (MOBIC) 15 MG tablet, Take 1 tablet (15 mg total) by mouth daily., Disp: 30 tablet, Rfl: 0   Multiple Vitamin (MULTIVITAMIN WITH MINERALS) TABS, Take 1 tablet by mouth daily., Disp: , Rfl:    sildenafil (VIAGRA) 100 MG tablet, TAKE 1 TABLET(100 MG) BY MOUTH DAILY AS NEEDED FOR ERECTILE DYSFUNCTION, Disp: 6 tablet, Rfl: 11   simvastatin (ZOCOR) 20 MG tablet, TAKE 1 TABLET BY MOUTH EVERY DAY, Disp: 30 tablet, Rfl: 11   tacrolimus (PROTOPIC) 0.1 % ointment, APPLY TOPICALLY TO THE AFFECTED AREA  TWICE DAILY, Disp: 60 g, Rfl: 5   tadalafil (CIALIS) 20 MG tablet, TAKE 1 TABLET(20 MG) BY MOUTH DAILY AS NEEDED FOR ERECTILE DYSFUNCTION, Disp: 10 tablet, Rfl: 6   Tetrahydrozoline HCl (EYE DROPS OP), Apply 2 drops to eye as needed (for redness in eyes)., Disp: , Rfl:    traMADol (ULTRAM) 50 MG tablet, Take 1 tablet (50 mg total) by mouth every 6 (six) hours as needed., Disp: 60 tablet, Rfl: 1   triamcinolone cream (KENALOG) 0.1 %, Apply 1 application topically 2 (two) times daily., Disp: 45 g, Rfl: 2   triamterene-hydrochlorothiazide (MAXZIDE-25) 37.5-25 MG tablet, TAKE 1 TABLET BY MOUTH DAILY, Disp: 30 tablet, Rfl: 4  Current Facility-Administered Medications:    0.9 %  sodium chloride infusion, 500 mL, Intravenous, Once, Ladene Artist, MD   Objective:     Vitals:   02/04/21 0921  BP: 130/90  Pulse: 88  SpO2: 99%  Weight: 222 lb (100.7 kg)  Height: 5\' 11"  (1.803 m)      Body mass index is 30.96 kg/m.    Physical Exam:    General: awake, alert, and oriented no acute distress, nontoxic Skin: no suspicious lesions or rashes Neuro:sensation intact distally with no dificits, normal muscle tone, no atrophy, strength 5/5 in all tested lower ext groups Psych: normal mood and affect, speech clear   Right Hip: No deformity, swelling or wasting ROM Fexion 90, ext 30, IR 45, ER 45 TTP hamstrings, most prominent hamstring origin and proximal hamstring TTP along anterior tibialis, quadriceps muscles, posterior tibialis NTTP over the hip flexors, adductors, glute musculature, si joint, lumbar spine Negative log roll with FROM Negative FABER Negative FADIR Negative Piriformis test Negative trendelenberg Gait normal Pain with resisted knee flexion, worse with knee flexion and hip internal rotation     Electronically signed by:  Jerry Crawford D.[facility] 9:59 AM 02/04/21

## 2021-02-05 ENCOUNTER — Ambulatory Visit: Payer: 59 | Admitting: Physical Therapy

## 2021-02-05 ENCOUNTER — Encounter: Payer: Self-pay | Admitting: Physical Therapy

## 2021-02-05 DIAGNOSIS — R2689 Other abnormalities of gait and mobility: Secondary | ICD-10-CM

## 2021-02-05 DIAGNOSIS — M79651 Pain in right thigh: Secondary | ICD-10-CM

## 2021-02-05 DIAGNOSIS — M79604 Pain in right leg: Secondary | ICD-10-CM | POA: Diagnosis not present

## 2021-02-05 DIAGNOSIS — M6281 Muscle weakness (generalized): Secondary | ICD-10-CM

## 2021-02-05 NOTE — Therapy (Signed)
[place], [place], [address] Phone: [phone]   Fax:  [phone]  Physical Therapy Treatment  Patient Details  Name: Jerry Crawford MRN: [id number] Date of Birth: Nov 30, 1960 Referring Provider (PT): Glennon Mac, DO   Encounter Date: 02/05/2021   PT End of Session - 02/05/21 0904     Visit Number 2    Number of Visits 12    Date for PT Re-Evaluation 03/11/21    Authorization Type [employer]    PT Start Time [phone]    PT Stop Time 0930    PT Time Calculation (min) 40 min             Past Medical History:  Diagnosis Date   Allergy    Hypercholesteremia    Hyperlipidemia    Hypertension     Past Surgical History:  Procedure Laterality Date   COLONOSCOPY  06/02/2017   per Dr. Fuller Plan, adenomatous polyp, repeat in 5 yrs    ENDOVENOUS ABLATION SAPHENOUS VEIN W/ LASER Left 11/08/2016   endovenous laser ablation left small saphenous vein by Tinnie Gens MD    ENDOVENOUS [place] W/ LASER Left 11/23/2016   endovenous laser ablation left greater saphenous vein and stab phlebectomy > 20 incisions left leg by Tinnie Gens MD   ENDOVENOUS [place] W/ LASER Right 12/26/2016   endovenous laser ablation R greater saphenous vein and stab phlebectomy > 20 incisions R leg by Tinnie Gens MD     There were no vitals filed for this visit.   Subjective Assessment - 02/05/21 0852     Subjective I am getting a little better. Saw MD who said to keep doing the PT. Pain is lateral thigh, shin and hamstring.    Currently in Pain? Yes    Pain Score 6     Pain Location Leg    Pain Orientation Right    Pain Descriptors / Indicators Tightness;Aching;Throbbing    Pain Type Acute pain    Aggravating Factors  night time    Pain Relieving Factors meloxicam                   [facility] Adult PT Treatment/Exercise - 02/05/21 0001       Knee/Hip Exercises: Stretches   Active  Hamstring Stretch 3 reps;30 seconds    Active Hamstring Stretch Limitations seated EOM right    Piriformis Stretch Limitations figure 4 push and pull      Knee/Hip Exercises: Aerobic   Recumbent Bike L1 x 5 minutes      Knee/Hip Exercises: Supine   Heel Slides 10 reps    Bridges 10 reps;2 sets    Straight Leg Raises 10 reps;Right      Knee/Hip Exercises: Sidelying   Clams 10 x 2 right      Knee/Hip Exercises: Prone   Hamstring Curl 15 reps   right   Hip Extension 10 reps   right     Modalities   Modalities Cryotherapy      Cryotherapy   Number Minutes Cryotherapy 10 Minutes    Cryotherapy Location Knee   posterior   Type of Cryotherapy Ice pack                       PT Short Term Goals - 01/28/21 0930       PT SHORT TERM GOAL #1   Title Patient will be I with initial HEP to progress with PT  Time 3    Period Weeks    Status New    Target Date 02/18/21      PT SHORT TERM GOAL #2   Title PT will review FOTO with patient to understand expected progress with therapy    Time 3    Period Weeks    Status New    Target Date 02/18/21      PT SHORT TERM GOAL #3   Title Patient will be able to ambulate community level distances without AD    Time 3    Period Weeks    Status New    Target Date 02/18/21               PT Long Term Goals - 01/28/21 0932       PT LONG TERM GOAL #1   Title Patient will be I with final HEP to maintain progress from PT    Time 6    Period Weeks    Status New    Target Date 03/11/21      PT LONG TERM GOAL #2   Title Patient will report >/= 66% status on FOTO to indicate improve funtional ability    Time 6    Period Weeks    Status New    Target Date 03/11/21      PT LONG TERM GOAL #3   Title Patient will exhibit 5/5 MMT knee strength and >/= 4/5 MMT hip strength to allow for return to work without limitation    Time 6    Period Weeks    Status New    Target Date 03/11/21      PT LONG TERM GOAL #4   Title  Patient will be able to lift >/= 30lbs from floor without increase in pain t allow for return to work    Time 6    Period Weeks    Status New    Target Date 03/11/21                   Plan - 02/05/21 1200     Clinical Impression Statement Mr Bransfield reports his hamstring pain has improved. He continues to have lateral leg and shin pain as well as hamstring pain. Reviewed HEP and began rec bike. Progressed with gentle hamstring activation and stretching. Ice pack placed on hamstring post session and education provided on using ice at home to reduce inflammation and pain related to his strain.    PT Treatment/Interventions ADLs/Self Care Home Management;Aquatic Therapy;Cryotherapy;Electrical Stimulation;Iontophoresis 4mg /ml Dexamethasone;Moist Heat;Traction;Ultrasound;Neuromuscular re-education;Balance training;Therapeutic exercise;Therapeutic activities;Functional mobility training;Stair training;Gait training;Patient/family education;Manual techniques;Dry needling;Passive range of motion;Taping;Vasopneumatic Device;Spinal Manipulations;Joint Manipulations    PT Next Visit Plan Review HEP and progress PRN, recumbent bike, gentle stretching for hamstring, light hamstring and hip strengthening, gait training    PT Home Exercise Plan [email or web address]             Patient will benefit from skilled therapeutic intervention in order to improve the following deficits and impairments:  Abnormal gait, Decreased range of motion, Difficulty walking, Pain, Decreased activity tolerance, Decreased strength, Impaired flexibility  Visit Diagnosis: Pain in right leg  Muscle weakness (generalized)  Other abnormalities of gait and mobility  Pain in right thigh     Problem List Patient Active Problem List   Diagnosis Date Noted   Leukopenia 05/08/2020   Erectile dysfunction 09/01/2017   Premature ejaculation 03/13/2017   Seasonal allergies 07/29/2016   Hypertension 07/29/2016   Decreased  testosterone level 06/15/2016  Ganglion cyst of wrist, left 06/15/2016   Elevated fasting blood sugar 06/15/2016   Varicose veins of bilateral lower extremities with other complications 11/73/5670    Dorene Ar, PTA 02/05/2021, 12:02 PM  [facility] [address] [place], [place], [address] Phone: [phone]   Fax:  [phone]  Name: YASSINE BRUNSMAN MRN: [id number] Date of Birth: May 24, 1960

## 2021-02-09 ENCOUNTER — Ambulatory Visit: Payer: 59 | Attending: Sports Medicine | Admitting: Physical Therapy

## 2021-02-09 ENCOUNTER — Other Ambulatory Visit: Payer: Self-pay

## 2021-02-09 DIAGNOSIS — M79651 Pain in right thigh: Secondary | ICD-10-CM | POA: Insufficient documentation

## 2021-02-09 DIAGNOSIS — R2689 Other abnormalities of gait and mobility: Secondary | ICD-10-CM | POA: Insufficient documentation

## 2021-02-09 DIAGNOSIS — M6281 Muscle weakness (generalized): Secondary | ICD-10-CM | POA: Insufficient documentation

## 2021-02-09 DIAGNOSIS — M79604 Pain in right leg: Secondary | ICD-10-CM | POA: Diagnosis present

## 2021-02-09 NOTE — Therapy (Signed)
[place], [place], [address] Phone: [phone]   Fax:  [phone]  Physical Therapy Treatment  Patient Details  Name: Jerry Crawford MRN: [id number] Date of Birth: 1961/02/22 Referring Provider (PT): Glennon Mac, DO   Encounter Date: 02/09/2021   PT End of Session - 02/09/21 0821     Visit Number 3    Number of Visits 12    Date for PT Re-Evaluation 03/11/21    Authorization Type [employer]et[employer]    PT Start Time 0830    PT Stop Time 0910    PT Time Calculation (min) 40 min    Activity Tolerance Patient tolerated treatment well    Behavior During Therapy [facility] for tasks assessed/performed             Past Medical History:  Diagnosis Date   Allergy    Hypercholesteremia    Hyperlipidemia    Hypertension     Past Surgical History:  Procedure Laterality Date   COLONOSCOPY  06/02/2017   per Dr. Fuller Plan, adenomatous polyp, repeat in 5 yrs    ENDOVENOUS ABLATION SAPHENOUS VEIN W/ LASER Left 11/08/2016   endovenous laser ablation left small saphenous vein by Tinnie Gens MD    ENDOVENOUS [place] W/ LASER Left 11/23/2016   endovenous laser ablation left greater saphenous vein and stab phlebectomy > 20 incisions left leg by Tinnie Gens MD   ENDOVENOUS [place] W/ LASER Right 12/26/2016   endovenous laser ablation R greater saphenous vein and stab phlebectomy > 20 incisions R leg by Tinnie Gens MD     There were no vitals filed for this visit.   Subjective Assessment - 02/09/21 0833     Subjective Patient reports continued improvement in pain, states there are times when he doesn't feel any pain, but other times in the evenings when he has a lot of pain. He is consistent with his exercises.    Patient Stated Goals Pain relief and get back to work    Currently in Pain? Yes    Pain Score 5     Pain Location Leg    Pain Orientation Right    Pain Descriptors /  Indicators Tightness;Aching    Pain Type Acute pain    Pain Onset More than a month ago    Pain Frequency Intermittent    Aggravating Factors  Increased pain toward end of day and night, bending or stooping                [facility] PT Assessment - 02/09/21 0001       AROM   Right Knee Flexion 125                        [facility] Adult PT Treatment/Exercise:  Therapeutic Exercise: Heel slide x 10 Supine active hamstring stretch 10 x 5 sec SLR 2 x 10 Bridge 2 x 10 with 5 sec hold Prone hamstring curl with red 2 x 10 Supine hip extension with red 2 x 10 Sidelying hip abduction 2 x 10 Standing heel toe raises x 10  Manual Therapy: N/A  Neuromuscular re-ed: N/A  Therapeutic Activity: N/A  Modalities: N/A  Self Care: N/A             PT Education - 02/09/21 0821     Education Details HEP update    Person(s) Educated Patient    Methods Explanation;Demonstration;Verbal cues;Handout    Comprehension Verbalized understanding;Returned  demonstration;Verbal cues required;Need further instruction              PT Short Term Goals - 01/28/21 0930       PT SHORT TERM GOAL #1   Title Patient will be I with initial HEP to progress with PT    Time 3    Period Weeks    Status New    Target Date 02/18/21      PT SHORT TERM GOAL #2   Title PT will review FOTO with patient to understand expected progress with therapy    Time 3    Period Weeks    Status New    Target Date 02/18/21      PT SHORT TERM GOAL #3   Title Patient will be able to ambulate community level distances without AD    Time 3    Period Weeks    Status New    Target Date 02/18/21               PT Long Term Goals - 01/28/21 0932       PT LONG TERM GOAL #1   Title Patient will be I with final HEP to maintain progress from PT    Time 6    Period Weeks    Status New    Target Date 03/11/21      PT LONG TERM GOAL #2   Title Patient will report >/= 66% status on  FOTO to indicate improve funtional ability    Time 6    Period Weeks    Status New    Target Date 03/11/21      PT LONG TERM GOAL #3   Title Patient will exhibit 5/5 MMT knee strength and >/= 4/5 MMT hip strength to allow for return to work without limitation    Time 6    Period Weeks    Status New    Target Date 03/11/21      PT LONG TERM GOAL #4   Title Patient will be able to lift >/= 30lbs from floor without increase in pain t allow for return to work    Time 6    Period Weeks    Status New    Target Date 03/11/21                   Plan - 02/09/21 6222     Clinical Impression Statement Patient tolerated therapy well with no adverse effects. Therapy focused on progressive strenthening for the hamstring and incorporated hip strengthening. Patient reported tightness with exercise but denied increased pain. Update HEP to progress strengthening. Patient would benefit from continued skilled PT to progress his mobility and strength in order to reduce pain and maximize functional ability with return to work.    PT Treatment/Interventions ADLs/Self Care Home Management;Aquatic Therapy;Cryotherapy;Electrical Stimulation;Iontophoresis 4mg /ml Dexamethasone;Moist Heat;Traction;Ultrasound;Neuromuscular re-education;Balance training;Therapeutic exercise;Therapeutic activities;Functional mobility training;Stair training;Gait training;Patient/family education;Manual techniques;Dry needling;Passive range of motion;Taping;Vasopneumatic Device;Spinal Manipulations;Joint Manipulations    PT Next Visit Plan Review HEP and progress PRN, recumbent bike, gentle stretching for hamstring, light hamstring and hip strengthening, gait training    PT Home Exercise Plan [email or web address]    Consulted and Agree with Plan of Care Patient             Patient will benefit from skilled therapeutic intervention in order to improve the following deficits and impairments:  Abnormal gait, Decreased range of motion,  Difficulty walking, Pain, Decreased activity tolerance, Decreased strength, Impaired flexibility  Visit Diagnosis:  Pain in right leg  Muscle weakness (generalized)  Other abnormalities of gait and mobility     Problem List Patient Active Problem List   Diagnosis Date Noted   Leukopenia 05/08/2020   Erectile dysfunction 09/01/2017   Premature ejaculation 03/13/2017   Seasonal allergies 07/29/2016   Hypertension 07/29/2016   Decreased testosterone level 06/15/2016   Ganglion cyst of wrist, left 06/15/2016   Elevated fasting blood sugar 06/15/2016   Varicose veins of bilateral lower extremities with other complications 99/24/2683    Hilda Blades, PT, DPT, LAT, ATC 02/09/21  9:29 AM Phone: [phone] Fax: [facility] [address] [place], [place], [address] Phone: [phone]   Fax:  [phone]  Name: Jerry Crawford MRN: [id number] Date of Birth: 08/28/1960

## 2021-02-09 NOTE — Patient Instructions (Signed)
Access Code: [email or web address] URL: [email or web address] Date: 02/09/2021 Prepared by: Hilda Blades  Exercises Supine Hamstring Stretch - 1 x daily - 7 x weekly - 2 sets - 10 reps - 5 hold Supine Bridge - 1 x daily - 7 x weekly - 2 sets - 10 reps - 5 hold Prone Hamstring Curl with Anchored Resistance - 1 x daily - 7 x weekly - 2 sets - 10 reps Supine Eccentric Hip Extension with Resistance - 1 x daily - 7 x weekly - 2 sets - 10 reps Sidelying Hip Abduction - 1 x daily - 7 x weekly - 2 sets - 10 reps Heel Toe Raises with Counter Support - 1 x daily - 7 x weekly - 2 sets - 10 reps

## 2021-02-12 ENCOUNTER — Encounter: Payer: Self-pay | Admitting: Physical Therapy

## 2021-02-12 ENCOUNTER — Ambulatory Visit: Payer: 59 | Admitting: Physical Therapy

## 2021-02-12 ENCOUNTER — Other Ambulatory Visit: Payer: Self-pay

## 2021-02-12 DIAGNOSIS — M79604 Pain in right leg: Secondary | ICD-10-CM

## 2021-02-12 DIAGNOSIS — R2689 Other abnormalities of gait and mobility: Secondary | ICD-10-CM

## 2021-02-12 DIAGNOSIS — M6281 Muscle weakness (generalized): Secondary | ICD-10-CM

## 2021-02-12 NOTE — Therapy (Signed)
[place], [place], [address] Phone: [phone]   Fax:  [phone]  Physical Therapy Treatment  Patient Details  Name: Jerry Crawford MRN: [id number] Date of Birth: 11-05-1960 Referring Provider (PT): Glennon Mac, DO   Encounter Date: 02/12/2021   PT End of Session - 02/12/21 1106     Visit Number 4    Number of Visits 12    Date for PT Re-Evaluation 03/11/21    Authorization Type [employer]    PT Start Time 1045    PT Stop Time 1130    PT Time Calculation (min) 45 min    Activity Tolerance Patient tolerated treatment well    Behavior During Therapy [facility] for tasks assessed/performed             Past Medical History:  Diagnosis Date   Allergy    Hypercholesteremia    Hyperlipidemia    Hypertension     Past Surgical History:  Procedure Laterality Date   COLONOSCOPY  06/02/2017   per Dr. Fuller Plan, adenomatous polyp, repeat in 5 yrs    ENDOVENOUS ABLATION SAPHENOUS VEIN W/ LASER Left 11/08/2016   endovenous laser ablation left small saphenous vein by Tinnie Gens MD    ENDOVENOUS [place] W/ LASER Left 11/23/2016   endovenous laser ablation left greater saphenous vein and stab phlebectomy > 20 incisions left leg by Tinnie Gens MD   ENDOVENOUS [place] W/ LASER Right 12/26/2016   endovenous laser ablation R greater saphenous vein and stab phlebectomy > 20 incisions R leg by Tinnie Gens MD     There were no vitals filed for this visit.   Subjective Assessment - 02/12/21 1049     Subjective Patient reports continued improvement. He is no longer using a cane for walking but still occasionally gets the pain.    Patient Stated Goals Pain relief and get back to work    Currently in Pain? Yes    Pain Score 5     Pain Location Leg    Pain Orientation Right    Pain Descriptors / Indicators Aching;Tightness    Pain Type Acute pain    Pain Onset More than a  month ago    Pain Frequency Intermittent    Aggravating Factors  Increased pain toward end of day and night, walking, bending or stooping                [facility] PT Assessment - 02/12/21 0001       Strength   Right Knee Flexion 4/5      Flexibility   Hamstrings 90-90 hamstring lacking 35 deg from full extension on right   lacking 15 deg on left     Ambulation/Gait   Ambulation/Gait Yes    Ambulation/Gait Assistance 7: Independent    Gait Comments No AD, mildly antalgic on right                [facility] Adult PT Treatment/Exercise:  Therapeutic Exercise: Recumbent bike L2 x 5 min while taking subjective Supine active hamstring stretch 10 x 5 sec SLR through full range x 10 Bridge x 10 Bridge with heels elevated on physioball and slight knee bend 2 x 6 Hamstring curl machine 20# 2 x 15 Modified thomas stretch PROM 2 x 30 sec Squat to chair tap 2 x 10 - cued to avoid weight shift away from right  Manual Therapy: N/A  Neuromuscular re-ed: N/A  Therapeutic Activity: N/A  Modalities:  N/A  Self Care: N/A                     PT Education - 02/12/21 1106     Education Details HEP update    Person(s) Educated Patient    Methods Explanation;Demonstration;Verbal cues;Handout    Comprehension Verbalized understanding;Returned demonstration;Verbal cues required;Need further instruction              PT Short Term Goals - 02/12/21 1109       PT SHORT TERM GOAL #1   Title Patient will be I with initial HEP to progress with PT    Baseline progressing with HEP    Time 3    Period Weeks    Status On-going    Target Date 02/18/21      PT SHORT TERM GOAL #2   Title PT will review FOTO with patient to understand expected progress with therapy    Baseline Reviewed 4th visit    Time 3    Period Weeks    Status Achieved    Target Date 02/18/21      PT SHORT TERM GOAL #3   Title Patient will be able to ambulate community level distances  without AD    Baseline patient ambulating without AD    Time 3    Period Weeks    Status Achieved    Target Date 02/18/21               PT Long Term Goals - 01/28/21 0932       PT LONG TERM GOAL #1   Title Patient will be I with final HEP to maintain progress from PT    Time 6    Period Weeks    Status New    Target Date 03/11/21      PT LONG TERM GOAL #2   Title Patient will report >/= 66% status on FOTO to indicate improve funtional ability    Time 6    Period Weeks    Status New    Target Date 03/11/21      PT LONG TERM GOAL #3   Title Patient will exhibit 5/5 MMT knee strength and >/= 4/5 MMT hip strength to allow for return to work without limitation    Time 6    Period Weeks    Status New    Target Date 03/11/21      PT LONG TERM GOAL #4   Title Patient will be able to lift >/= 30lbs from floor without increase in pain t allow for return to work    Time 6    Period Weeks    Status New    Target Date 03/11/21                   Plan - 02/12/21 1106     Clinical Impression Statement Patient tolerated therapy well with no adverse effects. Patient is progressing well with strengthening program of right hamstring and demonstrates improve flexibility and strength this visit. Updated HEP to include squatting this visit and he tolerated this well. He did report anterior thigh and shin tightness this visit so trialed quad stretching with good benefit. Patient would benefit from continued skilled PT to progress his mobility and strength in order to reduce pain and maximize functional ability with return to work.    PT Treatment/Interventions ADLs/Self Care Home Management;Aquatic Therapy;Cryotherapy;Electrical Stimulation;Iontophoresis 4mg /ml Dexamethasone;Moist Heat;Traction;Ultrasound;Neuromuscular re-education;Balance training;Therapeutic exercise;Therapeutic activities;Functional mobility training;Stair training;Gait training;Patient/family education;Manual  techniques;Dry  needling;Passive range of motion;Taping;Vasopneumatic Device;Spinal Manipulations;Joint Manipulations    PT Next Visit Plan Review HEP and progress PRN, recumbent bike, gentle stretching for hamstring, light hamstring and hip strengthening, gait training    PT Home Exercise Plan [id number]    Consulted and Agree with Plan of Care Patient             Patient will benefit from skilled therapeutic intervention in order to improve the following deficits and impairments:  Abnormal gait, Decreased range of motion, Difficulty walking, Pain, Decreased activity tolerance, Decreased strength, Impaired flexibility  Visit Diagnosis: Pain in right leg  Muscle weakness (generalized)  Other abnormalities of gait and mobility     Problem List Patient Active Problem List   Diagnosis Date Noted   Leukopenia 05/08/2020   Erectile dysfunction 09/01/2017   Premature ejaculation 03/13/2017   Seasonal allergies 07/29/2016   Hypertension 07/29/2016   Decreased testosterone level 06/15/2016   Ganglion cyst of wrist, left 06/15/2016   Elevated fasting blood sugar 06/15/2016   Varicose veins of bilateral lower extremities with other complications 79/15/0569    Hilda Blades, PT, DPT, LAT, ATC 02/12/21  11:31 AM Phone: [phone] Fax: [place] [facility] [address] [place], [place], [address] Phone: [phone]   Fax:  [phone]  Name: Jerry Crawford MRN: [id number] Date of Birth: November 23, 1960

## 2021-02-14 ENCOUNTER — Other Ambulatory Visit: Payer: Self-pay | Admitting: Sports Medicine

## 2021-02-16 ENCOUNTER — Ambulatory Visit: Payer: 59 | Admitting: Physical Therapy

## 2021-02-16 NOTE — Progress Notes (Signed)
Jerry Crawford D.[place] Phone: [phone]   Assessment and Plan:     1. Strain of right hamstring muscle, subsequent encounter -Subacute, significant improvement, subsequent visit - Likely proximal hamstring strain versus incomplete tear with significant improvement - Continue physical therapy until PT feels patient has had maximal improvement - Continue HEP at home - Discontinue regular use of meloxicam and may use remaining as needed for pain control - Discontinue can use - May restart work starting next Monday, 02/22/2021 with gradual increase in duties as tolerated   Pertinent previous records reviewed include outpatient rehab note 02/12/2021, outpatient rehab note 02/09/2021, outpatient rehab note 02/05/2021   Follow Up: As needed if no improvement or worsening of symptoms   Subjective:   I, Jerry Crawford, am serving as a scribe for Dr. Glennon Mac  Chief Complaint: right hamstring pain follow up   HPI:     02/04/21 Patient states that he is doing better, still has pain in the upper R hamstring and lateral R leg radiating down the front. Patient is able to bear weight now, is using a cane as recommended by PT. Patient is walking but is still limping.   02/17/21 Patient states that he is doing a lot better. Patient states that the PT is working very well, sometimes a little too well and feels like he needs to take a rest afterward. States will still have some upper thigh/leg pain here and there, but over all is doing great. No longer needing to use the cane.  Relevant Historical Information: Bilateral varicose veins  Additional pertinent review of systems negative.   Current Outpatient Medications:    amLODipine (NORVASC) 5 MG tablet, Take 1 tablet (5 mg total) by mouth daily., Disp: 90 tablet, Rfl: 3   azelastine (OPTIVAR) 0.05 % ophthalmic solution, Place 1 drop into both eyes 2 (two)  times daily., Disp: 6 mL, Rfl: 11   desonide (DESOWEN) 0.05 % lotion, Apply topically 2 (two) times daily., Disp: 59 mL, Rfl: 11   IBU 600 MG tablet, Take 600 mg by mouth every 8 (eight) hours as needed for headache or moderate pain., Disp: , Rfl: 0   loratadine (CLARITIN) 10 MG tablet, Take 10 mg by mouth daily., Disp: , Rfl:    meloxicam (MOBIC) 15 MG tablet, Take 1 tablet (15 mg total) by mouth daily., Disp: 30 tablet, Rfl: 0   Multiple Vitamin (MULTIVITAMIN WITH MINERALS) TABS, Take 1 tablet by mouth daily., Disp: , Rfl:    sildenafil (VIAGRA) 100 MG tablet, TAKE 1 TABLET(100 MG) BY MOUTH DAILY AS NEEDED FOR ERECTILE DYSFUNCTION, Disp: 6 tablet, Rfl: 11   simvastatin (ZOCOR) 20 MG tablet, TAKE 1 TABLET BY MOUTH EVERY DAY, Disp: 30 tablet, Rfl: 11   tacrolimus (PROTOPIC) 0.1 % ointment, APPLY TOPICALLY TO THE AFFECTED AREA TWICE DAILY, Disp: 60 g, Rfl: 5   tadalafil (CIALIS) 20 MG tablet, TAKE 1 TABLET(20 MG) BY MOUTH DAILY AS NEEDED FOR ERECTILE DYSFUNCTION, Disp: 10 tablet, Rfl: 6   Tetrahydrozoline HCl (EYE DROPS OP), Apply 2 drops to eye as needed (for redness in eyes)., Disp: , Rfl:    traMADol (ULTRAM) 50 MG tablet, Take 1 tablet (50 mg total) by mouth every 6 (six) hours as needed., Disp: 60 tablet, Rfl: 1   triamcinolone cream (KENALOG) 0.1 %, Apply 1 application topically 2 (two) times daily., Disp: 45 g, Rfl: 2   triamterene-hydrochlorothiazide (MAXZIDE-25) 37.5-25 MG tablet, TAKE  1 TABLET BY MOUTH DAILY, Disp: 30 tablet, Rfl: 4  Current Facility-Administered Medications:    0.9 %  sodium chloride infusion, 500 mL, Intravenous, Once, Ladene Artist, MD   Objective:     Vitals:   02/17/21 0930  BP: 120/84  Pulse: 93  SpO2: 99%  Weight: 220 lb (99.8 kg)  Height: 5\' 11"  (1.803 m)      Body mass index is 30.68 kg/m.    Physical Exam:    General: awake, alert, and oriented no acute distress, nontoxic Skin: no suspicious lesions or rashes Neuro:sensation intact distally  with no dificits, normal muscle tone, no atrophy, strength 5/5 in all tested lower ext groups Psych: normal mood and affect, speech clear  Right hip: No deformity, swelling or wasting ROM Fexion 90, ext 30, IR 45, ER 45 No pain with resisted hip flexion NTTP over the hip flexors, greater troch, glute musculature, si joint, lumbar spine Negative log roll with FROM Negative FABER Negative FADIR Negative Piriformis test  Gait normal    Electronically signed by:  Jerry Crawford D.[facility] 9:48 AM 02/17/21

## 2021-02-17 ENCOUNTER — Ambulatory Visit (INDEPENDENT_AMBULATORY_CARE_PROVIDER_SITE_OTHER): Payer: 59 | Admitting: Sports Medicine

## 2021-02-17 ENCOUNTER — Other Ambulatory Visit: Payer: Self-pay

## 2021-02-17 ENCOUNTER — Telehealth: Payer: Self-pay | Admitting: Physical Therapy

## 2021-02-17 VITALS — BP 120/84 | HR 93 | Ht 71.0 in | Wt 220.0 lb

## 2021-02-17 DIAGNOSIS — S76311D Strain of muscle, fascia and tendon of the posterior muscle group at thigh level, right thigh, subsequent encounter: Secondary | ICD-10-CM

## 2021-02-17 NOTE — Patient Instructions (Addendum)
Good to see you  Follow up as needed  

## 2021-02-17 NOTE — Telephone Encounter (Signed)
Spoke with patient regarding missed PT appointment on 02/16/2021. Patient states he had a migraine and slept past his appointment time. Reminded patient of next scheduled appointment and advised patient of attendance policy. Patient expressed understanding.  Hilda Blades, PT, DPT, LAT, ATC 02/17/21  1:03 PM Phone: [phone] Fax: [phone]

## 2021-02-18 ENCOUNTER — Encounter: Payer: Self-pay | Admitting: Physical Therapy

## 2021-02-18 ENCOUNTER — Other Ambulatory Visit: Payer: Self-pay

## 2021-02-18 ENCOUNTER — Ambulatory Visit: Payer: 59 | Admitting: Physical Therapy

## 2021-02-18 DIAGNOSIS — M6281 Muscle weakness (generalized): Secondary | ICD-10-CM

## 2021-02-18 DIAGNOSIS — M79651 Pain in right thigh: Secondary | ICD-10-CM

## 2021-02-18 DIAGNOSIS — M79604 Pain in right leg: Secondary | ICD-10-CM

## 2021-02-18 DIAGNOSIS — R2689 Other abnormalities of gait and mobility: Secondary | ICD-10-CM

## 2021-02-18 NOTE — Therapy (Signed)
[place], [place], [address] Phone: [phone]   Fax:  [phone]  Physical Therapy Treatment  Patient Details  Name: Jerry Crawford MRN: [id number] Date of Birth: Oct 05, 1960 Referring Provider (PT): Glennon Mac, DO   Encounter Date: 02/18/2021   PT End of Session - 02/18/21 1007     Visit Number 5    Number of Visits 12    Date for PT Re-Evaluation 03/11/21    Authorization Type [employer]et[employer]    PT Start Time 1000    PT Stop Time 1045    PT Time Calculation (min) 45 min    Activity Tolerance Patient tolerated treatment well    Behavior During Therapy [facility] for tasks assessed/performed             Past Medical History:  Diagnosis Date   Allergy    Hypercholesteremia    Hyperlipidemia    Hypertension     Past Surgical History:  Procedure Laterality Date   COLONOSCOPY  06/02/2017   per Dr. Fuller Plan, adenomatous polyp, repeat in 5 yrs    ENDOVENOUS ABLATION SAPHENOUS VEIN W/ LASER Left 11/08/2016   endovenous laser ablation left small saphenous vein by Tinnie Gens MD    ENDOVENOUS [place] W/ LASER Left 11/23/2016   endovenous laser ablation left greater saphenous vein and stab phlebectomy > 20 incisions left leg by Tinnie Gens MD   ENDOVENOUS [place] W/ LASER Right 12/26/2016   endovenous laser ablation R greater saphenous vein and stab phlebectomy > 20 incisions R leg by Tinnie Gens MD     There were no vitals filed for this visit.   Subjective Assessment - 02/18/21 1006     Subjective Patient reports he continues to get better but he is not quite there yet.    Patient Stated Goals Pain relief and get back to work    Currently in Pain? Yes    Pain Score 4     Pain Location Leg    Pain Orientation Right    Pain Descriptors / Indicators Aching;Tightness    Pain Type Acute pain    Pain Onset More than a month ago    Pain Frequency Intermittent     Aggravating Factors  Increased pain toward end of day and night, walking, bending or stooping                [facility] PT Assessment - 02/18/21 0001       Flexibility   Hamstrings 90-90 hamstring lacking 20 deg from full extension on right   lacking 15 deg on left                 [facility] Adult PT Treatment/Exercise:  Therapeutic Exercise: Recumbent bike L3 x 5 min  Bridge with feet elevated on physioball 3 x 8 SL bridge 2 x 5 Sidelying hip abduction 2 x 10 Prone hamstring curl with FM 7# 2 x 10 Pallof press with FM 10# 2 x 10 each Deadlift 25# with 8" box 2 x 10  Manual Therapy: N/A  Neuromuscular re-ed: N/A  Therapeutic Activity: N/A  Modalities: N/A  Self Care: N/A                   PT Education - 02/18/21 1007     Education Details HEP    Person(s) Educated Patient    Methods Explanation;Demonstration;Verbal cues    Comprehension Verbalized understanding;Returned demonstration;Verbal cues required;Need further instruction  PT Short Term Goals - 02/12/21 1109       PT SHORT TERM GOAL #1   Title Patient will be I with initial HEP to progress with PT    Baseline progressing with HEP    Time 3    Period Weeks    Status On-going    Target Date 02/18/21      PT SHORT TERM GOAL #2   Title PT will review FOTO with patient to understand expected progress with therapy    Baseline Reviewed 4th visit    Time 3    Period Weeks    Status Achieved    Target Date 02/18/21      PT SHORT TERM GOAL #3   Title Patient will be able to ambulate community level distances without AD    Baseline patient ambulating without AD    Time 3    Period Weeks    Status Achieved    Target Date 02/18/21               PT Long Term Goals - 01/28/21 0932       PT LONG TERM GOAL #1   Title Patient will be I with final HEP to maintain progress from PT    Time 6    Period Weeks    Status New    Target Date 03/11/21      PT  LONG TERM GOAL #2   Title Patient will report >/= 66% status on FOTO to indicate improve funtional ability    Time 6    Period Weeks    Status New    Target Date 03/11/21      PT LONG TERM GOAL #3   Title Patient will exhibit 5/5 MMT knee strength and >/= 4/5 MMT hip strength to allow for return to work without limitation    Time 6    Period Weeks    Status New    Target Date 03/11/21      PT LONG TERM GOAL #4   Title Patient will be able to lift >/= 30lbs from floor without increase in pain t allow for return to work    Time 6    Period Weeks    Status New    Target Date 03/11/21                   Plan - 02/18/21 1011     Clinical Impression Statement Patient tolerated therapy well with no adverse effects. He demonstrates improved hamstring mobility this visit and is progressing well with strengtheing exercises. He did not report any pain with therapy but did report tightness in right hamstring with exercises. He does continue to demonstrate gross hamstring and hip strength deficit and fatigue. Incorporated lifting this visit with good tolerance. He did require cueing for proper hip hinge technique. Patient would benefit from continued skilled PT to progress his mobility and strength in order to reduce pain and maximize functional ability with return to work.    PT Treatment/Interventions ADLs/Self Care Home Management;Aquatic Therapy;Cryotherapy;Electrical Stimulation;Iontophoresis 4mg /ml Dexamethasone;Moist Heat;Traction;Ultrasound;Neuromuscular re-education;Balance training;Therapeutic exercise;Therapeutic activities;Functional mobility training;Stair training;Gait training;Patient/family education;Manual techniques;Dry needling;Passive range of motion;Taping;Vasopneumatic Device;Spinal Manipulations;Joint Manipulations    PT Next Visit Plan Review HEP and progress PRN, recumbent bike, gentle stretching for hamstring, light hamstring and hip strengthening, gait training    PT  Home Exercise Plan [id number]    Consulted and Agree with Plan of Care Patient  Patient will benefit from skilled therapeutic intervention in order to improve the following deficits and impairments:  Abnormal gait, Decreased range of motion, Difficulty walking, Pain, Decreased activity tolerance, Decreased strength, Impaired flexibility  Visit Diagnosis: Pain in right leg  Muscle weakness (generalized)  Other abnormalities of gait and mobility  Pain in right thigh     Problem List Patient Active Problem List   Diagnosis Date Noted   Leukopenia 05/08/2020   Erectile dysfunction 09/01/2017   Premature ejaculation 03/13/2017   Seasonal allergies 07/29/2016   Hypertension 07/29/2016   Decreased testosterone level 06/15/2016   Ganglion cyst of wrist, left 06/15/2016   Elevated fasting blood sugar 06/15/2016   Varicose veins of bilateral lower extremities with other complications 02/07/1313    Hilda Blades, PT, DPT, LAT, ATC 02/18/21  10:46 AM Phone: [phone] Fax: [facility] [address] [place], [place], [address] Phone: [phone]   Fax:  [phone]  Name: Jerry Crawford MRN: [id number] Date of Birth: 07-29-1960

## 2021-02-23 ENCOUNTER — Telehealth: Payer: Self-pay

## 2021-02-23 ENCOUNTER — Encounter: Payer: Self-pay | Admitting: Physical Therapy

## 2021-02-23 ENCOUNTER — Ambulatory Visit: Payer: 59 | Admitting: Physical Therapy

## 2021-02-23 ENCOUNTER — Other Ambulatory Visit: Payer: Self-pay

## 2021-02-23 DIAGNOSIS — M79604 Pain in right leg: Secondary | ICD-10-CM

## 2021-02-23 DIAGNOSIS — M6281 Muscle weakness (generalized): Secondary | ICD-10-CM

## 2021-02-23 DIAGNOSIS — R2689 Other abnormalities of gait and mobility: Secondary | ICD-10-CM

## 2021-02-23 NOTE — Therapy (Signed)
[place], [place], [address] Phone: [phone]   Fax:  [phone]  Physical Therapy Treatment  Patient Details  Name: Jerry Crawford MRN: [id number] Date of Birth: May 31, 1960 Referring Provider (PT): Glennon Mac, DO   Encounter Date: 02/23/2021   PT End of Session - 02/23/21 1622     Visit Number 6    Number of Visits 12    Date for PT Re-Evaluation 03/11/21    Authorization Type Aetna    PT Start Time 1615    PT Stop Time 1700    PT Time Calculation (min) 45 min    Activity Tolerance Patient tolerated treatment well    Behavior During Therapy [name] for tasks assessed/performed             Past Medical History:  Diagnosis Date   Allergy    Hypercholesteremia    Hyperlipidemia    Hypertension     Past Surgical History:  Procedure Laterality Date   COLONOSCOPY  06/02/2017   per Dr. Fuller Plan, adenomatous polyp, repeat in 5 yrs    ENDOVENOUS ABLATION SAPHENOUS VEIN W/ LASER Left 11/08/2016   endovenous laser ablation left small saphenous vein by Tinnie Gens MD    ENDOVENOUS [place] W/ LASER Left 11/23/2016   endovenous laser ablation left greater saphenous vein and stab phlebectomy > 20 incisions left leg by Tinnie Gens MD   ENDOVENOUS [place] W/ LASER Right 12/26/2016   endovenous laser ablation R greater saphenous vein and stab phlebectomy > 20 incisions R leg by Tinnie Gens MD     There were no vitals filed for this visit.   Subjective Assessment - 02/23/21 1620     Subjective Patient reports he is doing well. He has started back at work with light tasks and is doing well with that. States he has a little pain when he squats down but otherwise not much discomfort.    Patient Stated Goals Pain relief and get back to work    Currently in Pain? No/denies                [facility] PT Assessment - 02/23/21 0001       Flexibility    Hamstrings 90-90 hamstring lacking 15 deg from full extension on right   equal opposite side                  [facility] Adult PT Treatment/Exercise:   Therapeutic Exercise: Elliptical L3 x 5 min while taking subjective Seated hamstring stretch 3 x 20 sec Supine active hamstring stretch x 10 SL bridge 2 x 6 Bridge with hamstring curl on physioball 2 x 8 Prone hamstring curl with blue x 10 Supine hip extension with blue x 10 Deadlift 45# with 2" box 2 x 10   Manual Therapy: N/A   Neuromuscular re-ed: N/A   Therapeutic Activity: N/A   Modalities: N/A   Self Care: N/A                 PT Education - 02/23/21 1621     Education Details HEP update    Person(s) Educated Patient    Methods Explanation;Demonstration;Verbal cues    Comprehension Verbalized understanding;Returned demonstration;Verbal cues required;Need further instruction              PT Short Term Goals - 02/12/21 1109       PT SHORT TERM GOAL #1   Title Patient will be I with initial  HEP to progress with PT    Baseline progressing with HEP    Time 3    Period Weeks    Status On-going    Target Date 02/18/21      PT SHORT TERM GOAL #2   Title PT will review FOTO with patient to understand expected progress with therapy    Baseline Reviewed 4th visit    Time 3    Period Weeks    Status Achieved    Target Date 02/18/21      PT SHORT TERM GOAL #3   Title Patient will be able to ambulate community level distances without AD    Baseline patient ambulating without AD    Time 3    Period Weeks    Status Achieved    Target Date 02/18/21               PT Long Term Goals - 01/28/21 0932       PT LONG TERM GOAL #1   Title Patient will be I with final HEP to maintain progress from PT    Time 6    Period Weeks    Status New    Target Date 03/11/21      PT LONG TERM GOAL #2   Title Patient will report >/= 66% status on FOTO to indicate improve funtional ability     Time 6    Period Weeks    Status New    Target Date 03/11/21      PT LONG TERM GOAL #3   Title Patient will exhibit 5/5 MMT knee strength and >/= 4/5 MMT hip strength to allow for return to work without limitation    Time 6    Period Weeks    Status New    Target Date 03/11/21      PT LONG TERM GOAL #4   Title Patient will be able to lift >/= 30lbs from floor without increase in pain t allow for return to work    Time 6    Period Weeks    Status New    Target Date 03/11/21                   Plan - 02/23/21 1623     Clinical Impression Statement Patient tolerated therapy well with no adverse effects. Therapy continues to focus on strengthening of the hips and hamstrings. He is progressing well with resistance and weight with lifting. He did not report any pain with therapy, but did state he felt some tightness and muscular fatigue following therapy. Updated HEP with good tolerance. Patient would benefit from continued skilled PT to progress his mobility and strength in order to reduce pain and maximize functional ability with return to work.    PT Treatment/Interventions ADLs/Self Care Home Management;Aquatic Therapy;Cryotherapy;Electrical Stimulation;Iontophoresis 4mg /ml Dexamethasone;Moist Heat;Traction;Ultrasound;Neuromuscular re-education;Balance training;Therapeutic exercise;Therapeutic activities;Functional mobility training;Stair training;Gait training;Patient/family education;Manual techniques;Dry needling;Passive range of motion;Taping;Vasopneumatic Device;Spinal Manipulations;Joint Manipulations    PT Next Visit Plan Review HEP and progress PRN, recumbent bike, gentle stretching for hamstring, light hamstring and hip strengthening, gait training    PT Home Exercise Plan [email or web address]    Consulted and Agree with Plan of Care Patient             Patient will benefit from skilled therapeutic intervention in order to improve the following deficits and impairments:   Abnormal gait, Decreased range of motion, Difficulty walking, Pain, Decreased activity tolerance, Decreased strength, Impaired flexibility  Visit Diagnosis: Pain in right  leg  Muscle weakness (generalized)  Other abnormalities of gait and mobility     Problem List Patient Active Problem List   Diagnosis Date Noted   Leukopenia 05/08/2020   Erectile dysfunction 09/01/2017   Premature ejaculation 03/13/2017   Seasonal allergies 07/29/2016   Hypertension 07/29/2016   Decreased testosterone level 06/15/2016   Ganglion cyst of wrist, left 06/15/2016   Elevated fasting blood sugar 06/15/2016   Varicose veins of bilateral lower extremities with other complications 16/43/5391    Hilda Blades, PT, DPT, LAT, ATC 02/23/21  5:00 PM Phone: [phone] Fax: [place] [facility] [address] [place], [place], [address] Phone: [phone]   Fax:  [phone]  Name: Jerry Crawford MRN: [id number] Date of Birth: 1960-08-20

## 2021-02-23 NOTE — Telephone Encounter (Signed)
LVM for patient stating the FMLA was filled out, but there are three pages at the end that need his signature before we can fax them. Let him know to call us back to let us know if he would like the forms mailed to him, come in and sign and we can fax, or even email the forms to him.

## 2021-02-24 ENCOUNTER — Encounter: Payer: Self-pay | Admitting: Family Medicine

## 2021-02-24 ENCOUNTER — Ambulatory Visit: Payer: 59 | Admitting: Family Medicine

## 2021-02-24 VITALS — BP 146/92 | HR 84 | Temp 99.0°F | Wt 216.4 lb

## 2021-02-24 DIAGNOSIS — I10 Essential (primary) hypertension: Secondary | ICD-10-CM | POA: Diagnosis not present

## 2021-02-24 DIAGNOSIS — R7301 Impaired fasting glucose: Secondary | ICD-10-CM

## 2021-02-24 DIAGNOSIS — R000 Tachycardia, unspecified: Secondary | ICD-10-CM

## 2021-02-24 MED ORDER — METOPROLOL SUCCINATE ER 50 MG PO TB24: 50.0000 mg | ORAL_TABLET | Freq: Every day | ORAL | 3 refills | Status: DC

## 2021-02-24 NOTE — Progress Notes (Signed)
   Subjective:    Patient ID: Jerry Crawford, male    DOB: 01/30/1961, 60 y.o.   MRN: [id number]  HPI Here with concerns about elevated BP and heart rates over the past week. No recent medication changes except his sports medicine doctor, Benito Mccreedy, has started him on Meloxicam for a hamstring injury. He says his HR has been in the 90s at rest and up to 120 during exertion. His BP has been in the 140s over 90s. He feels slightly SOB at times, but there is no chest pain. He admits to being under a lot of stress lately with job and family issues.    Review of Systems  Constitutional: Negative.   Respiratory:  Positive for shortness of breath. Negative for cough and wheezing.   Cardiovascular:  Positive for palpitations. Negative for chest pain and leg swelling.      Objective:   Physical Exam Constitutional:      Appearance: Normal appearance. He is not ill-appearing.  Cardiovascular:     Rate and Rhythm: Normal rate and regular rhythm.     Pulses: Normal pulses.     Heart sounds: Normal heart sounds.  Pulmonary:     Effort: Pulmonary effort is normal.     Breath sounds: Normal breath sounds.  Neurological:     Mental Status: He is alert.          Assessment & Plan:  His BP and HR have been elevated lately, I think the most likely explanation is stress. However we will check a BMET, A1c, d dimer and a thyroid panel to rule out other etiologies. He will stay on Triamterene HCT but we will stop the Amlodipine. In its place he will try Metoprolol succinate 50 mg daily. Recheck in 2 weeks. Alysia Penna, MD

## 2021-02-25 ENCOUNTER — Encounter: Payer: Self-pay | Admitting: Physical Therapy

## 2021-02-25 ENCOUNTER — Ambulatory Visit: Payer: 59 | Admitting: Physical Therapy

## 2021-02-25 ENCOUNTER — Other Ambulatory Visit: Payer: Self-pay

## 2021-02-25 ENCOUNTER — Other Ambulatory Visit (INDEPENDENT_AMBULATORY_CARE_PROVIDER_SITE_OTHER): Payer: 59

## 2021-02-25 DIAGNOSIS — R2689 Other abnormalities of gait and mobility: Secondary | ICD-10-CM

## 2021-02-25 DIAGNOSIS — M791 Myalgia, unspecified site: Secondary | ICD-10-CM | POA: Diagnosis not present

## 2021-02-25 DIAGNOSIS — I10 Essential (primary) hypertension: Secondary | ICD-10-CM

## 2021-02-25 DIAGNOSIS — R7301 Impaired fasting glucose: Secondary | ICD-10-CM

## 2021-02-25 DIAGNOSIS — M79604 Pain in right leg: Secondary | ICD-10-CM

## 2021-02-25 DIAGNOSIS — M6281 Muscle weakness (generalized): Secondary | ICD-10-CM

## 2021-02-25 LAB — BASIC METABOLIC PANEL
BUN: 21 mg/dL (ref 6–23)
CO2: 31 mEq/L (ref 19–32)
Calcium: 9.8 mg/dL (ref 8.4–10.5)
Chloride: 99 mEq/L (ref 96–112)
Creatinine, Ser: 1.07 mg/dL (ref 0.40–1.50)
GFR: 76.6 mL/min (ref >60.00)
Glucose, Bld: 119 mg/dL — ABNORMAL HIGH (ref 70–99)
Potassium: 3.8 mEq/L (ref 3.5–5.1)
Sodium: 138 mEq/L (ref 135–145)

## 2021-02-25 LAB — CBC WITH DIFFERENTIAL/PLATELET
Basophils Absolute: 0 10*3/uL (ref 0.0–0.1)
Basophils Relative: 0.4 % (ref 0.0–3.0)
Eosinophils Absolute: 0.1 10*3/uL (ref 0.0–0.7)
Eosinophils Relative: 2.7 % (ref 0.0–5.0)
HCT: 46.4 % (ref 39.0–52.0)
Hemoglobin: 15.8 g/dL (ref 13.0–17.0)
Lymphocytes Relative: 50 % — ABNORMAL HIGH (ref 12.0–46.0)
Lymphs Abs: 1.7 10*3/uL (ref 0.7–4.0)
MCHC: 34.2 g/dL (ref 30.0–36.0)
MCV: 86.6 fl (ref 78.0–100.0)
Monocytes Absolute: 0.4 10*3/uL (ref 0.1–1.0)
Monocytes Relative: 11.1 % (ref 3.0–12.0)
Neutro Abs: 1.2 10*3/uL — ABNORMAL LOW (ref 1.4–7.7)
Neutrophils Relative %: 35.8 % — ABNORMAL LOW (ref 43.0–77.0)
Platelets: 220 10*3/uL (ref 150.0–400.0)
RBC: 5.35 Mil/uL (ref 4.22–5.81)
RDW: 13 % (ref 11.5–15.5)
WBC: 3.4 10*3/uL — ABNORMAL LOW (ref 4.0–10.5)

## 2021-02-25 LAB — T3, FREE: T3, Free: 3 pg/mL (ref 2.3–4.2)

## 2021-02-25 LAB — TSH: TSH: 1.39 u[IU]/mL (ref 0.35–5.50)

## 2021-02-25 LAB — T4, FREE: Free T4: 0.49 ng/dL — ABNORMAL LOW (ref 0.60–1.60)

## 2021-02-25 LAB — CK: Total CK: 569 U/L — ABNORMAL HIGH (ref 7–232)

## 2021-02-25 LAB — HEMOGLOBIN A1C: Hgb A1c MFr Bld: 6.8 % — ABNORMAL HIGH (ref 4.6–6.5)

## 2021-02-25 NOTE — Telephone Encounter (Signed)
Pt came by, completed his portion of ppwk. [place] faxed. Copy given to patient, provider and sent to scan.

## 2021-02-25 NOTE — Progress Notes (Signed)
Patient Care Team: Laurey Morale, MD as PCP - General (Family Medicine)  DIAGNOSIS:    ICD-10-CM   1. Neutropenia, unspecified type ([place])  D70.9       CHIEF COMPLIANT: Follow-up of leukopenia   INTERVAL HISTORY: Jerry Crawford is a 60 y.o. with above-mentioned history of of leukopenia. Labs on 02/25/2021 showed WBC 3.4. He presents to the clinic today for follow-up.  He had a recent problem with partially torn hamstring and he is recovering from that.  He has gone to physical therapy and had to be off work for a while.  ALLERGIES:  has No Known Allergies.  MEDICATIONS:  Current Outpatient Medications  Medication Sig Dispense Refill   azelastine (OPTIVAR) 0.05 % ophthalmic solution Place 1 drop into both eyes 2 (two) times daily. 6 mL 11   desonide (DESOWEN) 0.05 % lotion Apply topically 2 (two) times daily. 59 mL 11   IBU 600 MG tablet Take 600 mg by mouth every 8 (eight) hours as needed for headache or moderate pain.  0   loratadine (CLARITIN) 10 MG tablet Take 10 mg by mouth daily.     meloxicam (MOBIC) 15 MG tablet Take 1 tablet (15 mg total) by mouth daily. 30 tablet 0   metoprolol succinate (TOPROL-XL) 50 MG 24 hr tablet Take 1 tablet (50 mg total) by mouth daily. Take with or immediately following a meal. 30 tablet 3   Multiple Vitamin (MULTIVITAMIN WITH MINERALS) TABS Take 1 tablet by mouth daily.     sildenafil (VIAGRA) 100 MG tablet TAKE 1 TABLET(100 MG) BY MOUTH DAILY AS NEEDED FOR ERECTILE DYSFUNCTION 6 tablet 11   simvastatin (ZOCOR) 20 MG tablet TAKE 1 TABLET BY MOUTH EVERY DAY 30 tablet 11   tacrolimus (PROTOPIC) 0.1 % ointment APPLY TOPICALLY TO THE AFFECTED AREA TWICE DAILY 60 g 5   tadalafil (CIALIS) 20 MG tablet TAKE 1 TABLET(20 MG) BY MOUTH DAILY AS NEEDED FOR ERECTILE DYSFUNCTION 10 tablet 6   Tetrahydrozoline HCl (EYE DROPS OP) Apply 2 drops to eye as needed (for redness in eyes).     traMADol (ULTRAM) 50 MG tablet Take 1 tablet (50 mg total) by mouth  every 6 (six) hours as needed. 60 tablet 1   triamcinolone cream (KENALOG) 0.1 % Apply 1 application topically 2 (two) times daily. 45 g 2   triamterene-hydrochlorothiazide (MAXZIDE-25) 37.5-25 MG tablet TAKE 1 TABLET BY MOUTH DAILY 30 tablet 4   Current Facility-Administered Medications  Medication Dose Route Frequency Provider Last Rate Last Admin   0.9 %  sodium chloride infusion  500 mL Intravenous Once Ladene Artist, MD        PHYSICAL EXAMINATION: ECOG PERFORMANCE STATUS: 1 - Symptomatic but completely ambulatory  Vitals:   02/26/21 0908  BP: 135/85  Pulse: 78  Resp: 18  Temp: (!) 97.5 F (36.4 C)  SpO2: 100%   Filed Weights   02/26/21 0908  Weight: 213 lb 14.4 oz (97 kg)    LABORATORY DATA:  I have reviewed the data as listed CMP Latest Ref Rng & Units 02/25/2021 06/26/2020 03/20/2020  Glucose 70 - 99 mg/dL 119(H) 106(H) 80  BUN 6 - 23 mg/dL '21 15 16  ' Creatinine 0.40 - 1.50 mg/dL 1.07 0.96 0.98  Sodium 135 - 145 mEq/L 138 138 138  Potassium 3.5 - 5.1 mEq/L 3.8 4.1 3.9  Chloride 96 - 112 mEq/L 99 102 101  CO2 19 - 32 mEq/L '31 29 28  ' Calcium 8.4 - 10.5  mg/dL 9.8 9.5 9.9  Total Protein 6.0 - 8.3 g/dL - 6.5 6.9  Total Bilirubin 0.2 - 1.2 mg/dL - 1.2 1.7(H)  Alkaline Phos 39 - 117 U/L - 49 -  AST 0 - 37 U/L - 20 20  ALT 0 - 53 U/L - 22 17    Lab Results  Component Value Date   WBC 4.1 02/26/2021   HGB 16.2 02/26/2021   HCT 47.5 02/26/2021   MCV 86.8 02/26/2021   PLT 221 02/26/2021   NEUTROABS 1.5 (L) 02/26/2021    ASSESSMENT & PLAN:  Leukopenia Lab review: 03/30/2012: WBC 4.8, ANC 2.5 ALC 2 06/15/2016: WBC 3.8 04/21/2017: WBC 3.8, ANC 1.4, ALC 1.8 06/05/2018: WBC 3.3 ANC 1.4, ALC 1.4 05/08/20: WBC 3.3, ANC 1.4, B 12: 234, Folate 23, ANA Neg 06/26/20: WBC 3, ANC 1.3 08/14/2020: WBC 3, ANC 1 02/25/2021: WBC 3.4, ANC 1.2 02/26/2021: WBC 4.1, ANC 1.5  Bone Marrow Biopsy: 08/20/20: Normocellular bone marrow 40 to 60% cellularity with trilineage hematopoiesis.   No dysplastic cells.    Cytogenetics and FISH: Normal  Since the labs look relatively stable, we can recheck his labs in 1 year    No orders of the defined types were placed in this encounter.  The patient has a good understanding of the overall plan. he agrees with it. he will call with any problems that may develop before the next visit here.  Total time spent: 20 mins including face to face time and time spent for planning, charting and coordination of care  Rulon Eisenmenger, MD, MPH 02/26/2021  I, Thana Ates, am acting as scribe for Dr. Nicholas Lose.  I have reviewed the above documentation for accuracy and completeness, and I agree with the above.

## 2021-02-25 NOTE — Therapy (Signed)
[place], [place], [address] Phone: [phone]   Fax:  [phone]  Physical Therapy Treatment  Patient Details  Name: Jerry Crawford MRN: [id number] Date of Birth: 1960/12/04 Referring Provider (PT): Glennon Mac, DO   Encounter Date: 02/25/2021   PT End of Session - 02/25/21 1007     Visit Number 7    Number of Visits 12    Date for PT Re-Evaluation 03/11/21    Authorization Type [employer]et[employer]    PT Start Time 1000    PT Stop Time 1040    PT Time Calculation (min) 40 min    Activity Tolerance Patient tolerated treatment well    Behavior During Therapy [facility] for tasks assessed/performed             Past Medical History:  Diagnosis Date   Allergy    Hypercholesteremia    Hyperlipidemia    Hypertension     Past Surgical History:  Procedure Laterality Date   COLONOSCOPY  06/02/2017   per Dr. Fuller Plan, adenomatous polyp, repeat in 5 yrs    ENDOVENOUS ABLATION SAPHENOUS VEIN W/ LASER Left 11/08/2016   endovenous laser ablation left small saphenous vein by Tinnie Gens MD    ENDOVENOUS [place] W/ LASER Left 11/23/2016   endovenous laser ablation left greater saphenous vein and stab phlebectomy > 20 incisions left leg by Tinnie Gens MD   ENDOVENOUS [place] W/ LASER Right 12/26/2016   endovenous laser ablation R greater saphenous vein and stab phlebectomy > 20 incisions R leg by Tinnie Gens MD     There were no vitals filed for this visit.   Subjective Assessment - 02/25/21 1006     Subjective Patient reports he is doing well. He was sore following last visit.    Patient Stated Goals Pain relief and get back to work    Currently in Pain? No/denies                [facility] PT Assessment - 02/25/21 0001       Observation/Other Assessments   Focus on Therapeutic Outcomes (FOTO)  64% functional status                    [facility] Adult PT  Treatment/Exercise:  Therapeutic Exercise: Elliptical L3 R1 x 5 min while taking subjective Seated hamstring stretch 3 x 20 sec each Deadlift 45# 3 x 8 Runner step-up 10" 2 x 10 Side plank on knee with clamshell green band 2 x 12 each SLS with forward UE reach cone tap (T) 2 x 8 each  Manual Therapy: N/A  Neuromuscular re-ed: N/A  Therapeutic Activity: N/A  Modalities: N/A  Self Care: N/A                 PT Education - 02/25/21 1006     Education Details HEP    Person(s) Educated Patient    Methods Explanation;Demonstration;Verbal cues    Comprehension Verbalized understanding;Returned demonstration;Verbal cues required;Need further instruction              PT Short Term Goals - 02/12/21 1109       PT SHORT TERM GOAL #1   Title Patient will be I with initial HEP to progress with PT    Baseline progressing with HEP    Time 3    Period Weeks    Status On-going    Target Date 02/18/21      PT SHORT  TERM GOAL #2   Title PT will review FOTO with patient to understand expected progress with therapy    Baseline Reviewed 4th visit    Time 3    Period Weeks    Status Achieved    Target Date 02/18/21      PT SHORT TERM GOAL #3   Title Patient will be able to ambulate community level distances without AD    Baseline patient ambulating without AD    Time 3    Period Weeks    Status Achieved    Target Date 02/18/21               PT Long Term Goals - 02/25/21 1020       PT LONG TERM GOAL #1   Title Patient will be I with final HEP to maintain progress from PT    Baseline progressing    Time 6    Period Weeks    Status On-going    Target Date 03/11/21      PT LONG TERM GOAL #2   Title Patient will report >/= 66% status on FOTO to indicate improve funtional ability    Baseline 64% functional status    Time 6    Period Weeks    Status On-going    Target Date 03/11/21      PT LONG TERM GOAL #3   Title Patient will exhibit 5/5 MMT  knee strength and >/= 4/5 MMT hip strength to allow for return to work without limitation    Time 6    Period Weeks    Status On-going      PT LONG TERM GOAL #4   Title Patient will be able to lift >/= 30lbs from floor without increase in pain t allow for return to work    Baseline patient able to lift 45# without increase pain    Time 6    Period Weeks    Status Achieved                   Plan - 02/25/21 1007     Clinical Impression Statement Patient tolerated therapy well with no adverse effects. Therapy continued focus on strength progression and incorporating more single leg control. Patient did not report any pain but noted fatigue with exercise and feeling winded. He does demonstrate dynamic control deficit on single leg. No change to HEP this visit. Patient would benefit from continued skilled PT to progress his mobility and strength in order to reduce pain and maximize functional ability with return to work.    PT Treatment/Interventions ADLs/Self Care Home Management;Aquatic Therapy;Cryotherapy;Electrical Stimulation;Iontophoresis 4mg /ml Dexamethasone;Moist Heat;Traction;Ultrasound;Neuromuscular re-education;Balance training;Therapeutic exercise;Therapeutic activities;Functional mobility training;Stair training;Gait training;Patient/family education;Manual techniques;Dry needling;Passive range of motion;Taping;Vasopneumatic Device;Spinal Manipulations;Joint Manipulations    PT Next Visit Plan Review HEP and progress PRN, progress hamstring strengthening as tolerated    PT Home Exercise Plan [email or web address]    Consulted and Agree with Plan of Care Patient             Patient will benefit from skilled therapeutic intervention in order to improve the following deficits and impairments:  Abnormal gait, Decreased range of motion, Difficulty walking, Pain, Decreased activity tolerance, Decreased strength, Impaired flexibility  Visit Diagnosis: Pain in right leg  Muscle weakness  (generalized)  Other abnormalities of gait and mobility     Problem List Patient Active Problem List   Diagnosis Date Noted   Leukopenia 05/08/2020   Erectile dysfunction 09/01/2017   Premature ejaculation 03/13/2017  Seasonal allergies 07/29/2016   Hypertension 07/29/2016   Decreased testosterone level 06/15/2016   Ganglion cyst of wrist, left 06/15/2016   Elevated fasting blood sugar 06/15/2016   Varicose veins of bilateral lower extremities with other complications 08/71/9941    Hilda Blades, PT, DPT, LAT, ATC 02/25/21  10:41 AM Phone: [phone] Fax: [place] [facility] [address] [place], [place], [address] Phone: [phone]   Fax:  [phone]  Name: Jerry Crawford MRN: [id number] Date of Birth: [birth year]

## 2021-02-26 ENCOUNTER — Inpatient Hospital Stay: Payer: 59 | Admitting: Hematology and Oncology

## 2021-02-26 ENCOUNTER — Inpatient Hospital Stay: Payer: 59 | Attending: Hematology and Oncology

## 2021-02-26 DIAGNOSIS — D72819 Decreased white blood cell count, unspecified: Secondary | ICD-10-CM

## 2021-02-26 DIAGNOSIS — D709 Neutropenia, unspecified: Secondary | ICD-10-CM | POA: Insufficient documentation

## 2021-02-26 LAB — CBC WITH DIFFERENTIAL (CANCER CENTER ONLY)
Abs Immature Granulocytes: 0 10*3/uL (ref 0.00–0.07)
Basophils Absolute: 0 10*3/uL (ref 0.0–0.1)
Basophils Relative: 0 %
Eosinophils Absolute: 0.1 10*3/uL (ref 0.0–0.5)
Eosinophils Relative: 3 %
HCT: 47.5 % (ref 39.0–52.0)
Hemoglobin: 16.2 g/dL (ref 13.0–17.0)
Immature Granulocytes: 0 %
Lymphocytes Relative: 48 %
Lymphs Abs: 2 10*3/uL (ref 0.7–4.0)
MCH: 29.6 pg (ref 26.0–34.0)
MCHC: 34.1 g/dL (ref 30.0–36.0)
MCV: 86.8 fL (ref 80.0–100.0)
Monocytes Absolute: 0.4 10*3/uL (ref 0.1–1.0)
Monocytes Relative: 11 %
Neutro Abs: 1.5 10*3/uL — ABNORMAL LOW (ref 1.7–7.7)
Neutrophils Relative %: 38 %
Platelet Count: 221 10*3/uL (ref 150–400)
RBC: 5.47 MIL/uL (ref 4.22–5.81)
RDW: 12.6 % (ref 11.5–15.5)
WBC Count: 4.1 10*3/uL (ref 4.0–10.5)
nRBC: 0 % (ref 0.0–0.2)

## 2021-02-26 LAB — D-DIMER, QUANTITATIVE: D-Dimer, Quant: <0.19 mcg/mL FEU (ref <0.50)

## 2021-02-26 NOTE — Assessment & Plan Note (Signed)
Lab review: 03/30/2012: WBC 4.8, ANC 2.5 ALC 2 06/15/2016: WBC 3.8 04/21/2017: WBC 3.8, ANC 1.4, ALC 1.8 06/05/2018: WBC 3.3 ANC 1.4, ALC 1.4 05/08/20: WBC 3.3, ANC 1.4, B 12: 234, Folate 23, ANA Neg 06/26/20: WBC 3, ANC 1.3 08/14/2020: WBC 3, ANC 1 02/25/2021: WBC 3.4, ANC 1.2  Bone Marrow Biopsy: 08/20/20: Normocellular bone marrow 40 to 60% cellularity with trilineage hematopoiesis.  No dysplastic cells.    Cytogenetics and FISH: Normal  Since the labs look relatively stable, we can recheck his labs in 1 year

## 2021-03-01 ENCOUNTER — Telehealth: Payer: Self-pay | Admitting: Physical Therapy

## 2021-03-01 ENCOUNTER — Encounter: Payer: Self-pay | Admitting: Physical Therapy

## 2021-03-01 NOTE — Telephone Encounter (Signed)
Spoke with patient due to missed Pt appointment. Patient was informed of the missed appointment and advised of attendance policy. Patient elected to schedule an appointment on 03/02/21 at 12:15pm.  Hilda Blades, PT, DPT, LAT, ATC 03/01/21  11:53 AM Phone: [phone] Fax: [phone]

## 2021-03-02 ENCOUNTER — Encounter: Payer: Self-pay | Admitting: Physical Therapy

## 2021-03-02 ENCOUNTER — Other Ambulatory Visit: Payer: Self-pay

## 2021-03-02 ENCOUNTER — Ambulatory Visit: Payer: 59 | Admitting: Physical Therapy

## 2021-03-02 DIAGNOSIS — R2689 Other abnormalities of gait and mobility: Secondary | ICD-10-CM

## 2021-03-02 DIAGNOSIS — M6281 Muscle weakness (generalized): Secondary | ICD-10-CM

## 2021-03-02 DIAGNOSIS — M79604 Pain in right leg: Secondary | ICD-10-CM

## 2021-03-02 NOTE — Therapy (Signed)
[place], [place], [address] Phone: [phone]   Fax:  [phone]  Physical Therapy Treatment  Patient Details  Name: Jerry Crawford MRN: [id number] Date of Birth: 11-05-1960 Referring Provider (PT): Glennon Mac, DO   Encounter Date: 03/02/2021   PT End of Session - 03/02/21 1219     Visit Number 8    Number of Visits 12    Date for PT Re-Evaluation 03/11/21    Authorization Type [employer]    PT Start Time 1215    PT Stop Time 1255    PT Time Calculation (min) 40 min    Activity Tolerance Patient tolerated treatment well    Behavior During Therapy [facility] for tasks assessed/performed             Past Medical History:  Diagnosis Date   Allergy    Hypercholesteremia    Hyperlipidemia    Hypertension     Past Surgical History:  Procedure Laterality Date   COLONOSCOPY  06/02/2017   per Dr. Fuller Plan, adenomatous polyp, repeat in 5 yrs    ENDOVENOUS ABLATION SAPHENOUS VEIN W/ LASER Left 11/08/2016   endovenous laser ablation left small saphenous vein by Tinnie Gens MD    ENDOVENOUS [place] W/ LASER Left 11/23/2016   endovenous laser ablation left greater saphenous vein and stab phlebectomy > 20 incisions left leg by Tinnie Gens MD   ENDOVENOUS [place] W/ LASER Right 12/26/2016   endovenous laser ablation R greater saphenous vein and stab phlebectomy > 20 incisions R leg by Tinnie Gens MD     There were no vitals filed for this visit.   Subjective Assessment - 03/02/21 1217     Subjective Patient reports he is doing well. He was a little sore after last visit but it went away later in the evening. He states he is feeling alright and exercises are going well.    Patient Stated Goals Pain relief and get back to work    Currently in Pain? No/denies                [facility] PT Assessment - 03/02/21 0001       Strength   Right Knee Flexion 4+/5     Right Knee Extension 5/5                  [facility] Adult PT Treatment/Exercise:   Therapeutic Exercise: Recumbent bike L3 x 5 min while taking subjective Seated hamstring stretch 3 x 30 sec each Bridge with hamstring curl on physioball 3 x 8 Sidelying hip abduction with 3# 2 x 10 Deadlift 45# 3 x 8 SLS on Airex 3 x 30 sec Forward walking lunges 2 x 10 each   Manual Therapy: N/A   Neuromuscular re-ed: N/A   Therapeutic Activity: N/A   Modalities: N/A   Self Care: N/A                  PT Education - 03/02/21 1218     Education Details HEP    Person(s) Educated Patient    Methods Explanation;Demonstration;Verbal cues    Comprehension Verbalized understanding;Returned demonstration;Verbal cues required;Need further instruction              PT Short Term Goals - 02/12/21 1109       PT SHORT TERM GOAL #1   Title Patient will be I with initial HEP to progress with PT    Baseline progressing with HEP  Time 3    Period Weeks    Status On-going    Target Date 02/18/21      PT SHORT TERM GOAL #2   Title PT will review FOTO with patient to understand expected progress with therapy    Baseline Reviewed 4th visit    Time 3    Period Weeks    Status Achieved    Target Date 02/18/21      PT SHORT TERM GOAL #3   Title Patient will be able to ambulate community level distances without AD    Baseline patient ambulating without AD    Time 3    Period Weeks    Status Achieved    Target Date 02/18/21               PT Long Term Goals - 02/25/21 1020       PT LONG TERM GOAL #1   Title Patient will be I with final HEP to maintain progress from PT    Baseline progressing    Time 6    Period Weeks    Status On-going    Target Date 03/11/21      PT LONG TERM GOAL #2   Title Patient will report >/= 66% status on FOTO to indicate improve funtional ability    Baseline 64% functional status    Time 6    Period Weeks    Status On-going     Target Date 03/11/21      PT LONG TERM GOAL #3   Title Patient will exhibit 5/5 MMT knee strength and >/= 4/5 MMT hip strength to allow for return to work without limitation    Time 6    Period Weeks    Status On-going      PT LONG TERM GOAL #4   Title Patient will be able to lift >/= 30lbs from floor without increase in pain t allow for return to work    Baseline patient able to lift 45# without increase pain    Time 6    Period Weeks    Status Achieved                   Plan - 03/02/21 1220     Clinical Impression Statement Patient tolerated therapy well with no adverse effects. Therapy continues to progress strength for hamstring, core and hips. He is progressing well with his exercises and tolerates greater difficulty of exercises and increased resistance. He continues to have trouble with single leg control and balance. No changes to HEP this visit. Patient would benefit from continued skilled PT to progress his mobility and strength in order to reduce pain and maximize functional ability with return to work.    PT Treatment/Interventions ADLs/Self Care Home Management;Aquatic Therapy;Cryotherapy;Electrical Stimulation;Iontophoresis 4mg /ml Dexamethasone;Moist Heat;Traction;Ultrasound;Neuromuscular re-education;Balance training;Therapeutic exercise;Therapeutic activities;Functional mobility training;Stair training;Gait training;Patient/family education;Manual techniques;Dry needling;Passive range of motion;Taping;Vasopneumatic Device;Spinal Manipulations;Joint Manipulations    PT Next Visit Plan Review HEP and progress PRN, progress hamstring strengthening as tolerated    PT Home Exercise Plan [email or web address]    Consulted and Agree with Plan of Care Patient             Patient will benefit from skilled therapeutic intervention in order to improve the following deficits and impairments:  Abnormal gait, Decreased range of motion, Difficulty walking, Pain, Decreased activity  tolerance, Decreased strength, Impaired flexibility  Visit Diagnosis: Pain in right leg  Muscle weakness (generalized)  Other abnormalities of gait and mobility  Problem List Patient Active Problem List   Diagnosis Date Noted   Leukopenia 05/08/2020   Erectile dysfunction 09/01/2017   Premature ejaculation 03/13/2017   Seasonal allergies 07/29/2016   Hypertension 07/29/2016   Decreased testosterone level 06/15/2016   Ganglion cyst of wrist, left 06/15/2016   Elevated fasting blood sugar 06/15/2016   Varicose veins of bilateral lower extremities with other complications 41/75/3010    Hilda Blades, PT, DPT, LAT, ATC 03/02/21  1:46 PM Phone: [phone] Fax: [facility] [address] [place], [place], [address] Phone: [phone]   Fax:  [phone]  Name: BANKS CHAIKIN MRN: [id number] Date of Birth: Jan 26, 1961

## 2021-03-09 NOTE — Addendum Note (Signed)
Addended by: Alysia Penna A on: 03/09/2021 06:47 AM   Modules accepted: Orders

## 2021-03-10 ENCOUNTER — Ambulatory Visit: Payer: 59 | Admitting: Physical Therapy

## 2021-03-10 ENCOUNTER — Other Ambulatory Visit: Payer: Self-pay

## 2021-03-10 ENCOUNTER — Encounter: Payer: Self-pay | Admitting: Physical Therapy

## 2021-03-10 DIAGNOSIS — R2689 Other abnormalities of gait and mobility: Secondary | ICD-10-CM

## 2021-03-10 DIAGNOSIS — M6281 Muscle weakness (generalized): Secondary | ICD-10-CM

## 2021-03-10 DIAGNOSIS — M79604 Pain in right leg: Secondary | ICD-10-CM

## 2021-03-10 NOTE — Therapy (Signed)
[place], [place], [address] Phone: [phone]   Fax:  [phone]  Physical Therapy Treatment / ERO  Patient Details  Name: Jerry Crawford MRN: [id number] Date of Birth: 07-07-1960 Referring Provider (PT): Glennon Mac, DO   Encounter Date: 03/10/2021   PT End of Session - 03/10/21 0822     Visit Number 9    Number of Visits 13    Date for PT Re-Evaluation 04/07/21    Authorization Type [employer]    PT Start Time 0830    PT Stop Time 0915   5 min of session spent discussing POC   PT Time Calculation (min) 45 min    Activity Tolerance Patient tolerated treatment well    Behavior During Therapy [facility] for tasks assessed/performed             Past Medical History:  Diagnosis Date   Allergy    Hypercholesteremia    Hyperlipidemia    Hypertension     Past Surgical History:  Procedure Laterality Date   COLONOSCOPY  06/02/2017   per Dr. Fuller Plan, adenomatous polyp, repeat in 5 yrs    ENDOVENOUS ABLATION SAPHENOUS VEIN W/ LASER Left 11/08/2016   endovenous laser ablation left small saphenous vein by Tinnie Gens MD    ENDOVENOUS [place] W/ LASER Left 11/23/2016   endovenous laser ablation left greater saphenous vein and stab phlebectomy > 20 incisions left leg by Tinnie Gens MD   ENDOVENOUS [place] W/ LASER Right 12/26/2016   endovenous laser ablation R greater saphenous vein and stab phlebectomy > 20 incisions R leg by Tinnie Gens MD     There were no vitals filed for this visit.   Subjective Assessment - 03/10/21 0821     Subjective Patient reports his leg is still getting better. He does feel like he gets tired quicker when he is doing more at work.    How long can you walk comfortably? No limitation    Patient Stated Goals Pain relief and get back to work    Currently in Pain? No/denies                [facility] PT Assessment - 03/10/21 0001        Assessment   Medical Diagnosis Right hamstring strain    Referring Provider (PT) Glennon Mac, DO    Onset Date/Surgical Date 01/03/21      Precautions   Precautions None      Restrictions   Weight Bearing Restrictions No      Prior Function   Level of Independence Independent      Cognition   Overall Cognitive Status Within Functional Limits for tasks assessed      Observation/Other Assessments   Focus on Therapeutic Outcomes (FOTO)  90% functional status      Strength   Right Hip Extension 4-/5    Right Hip ABduction 4/5    Left Hip Extension 4/5    Left Hip ABduction 4/5    Right Knee Flexion 5/5   patient reports feeling a stretch in the hamstring   Right Knee Extension 5/5    Left Knee Flexion 5/5    Left Knee Extension 5/5      Flexibility   Hamstrings 90-90 hamstring lacking 15 deg from full extension on right   equal bilaterallly                 [facility] Adult PT Treatment/Exercise:  Therapeutic Exercise: Elliptical L5 R1 x 5 min while taking subjective Supine active hamstring stretch 10 x 5 sec each Sled push / pull 40# x 1 length, 80# x 6 lengths Barbell deadlift 75# 3 x 5 Sidelying hip abduction with 4# ankle weights 2 x 10 Forward walking lunges 2 x 10 each Kettlebell swing 25# 2 x 12   Manual Therapy: N/A   Neuromuscular re-ed: N/A   Therapeutic Activity: N/A   Modalities: N/A   Self Care: N/A                  PT Education - 03/10/21 0821     Education Details POC update, HEP    Person(s) Educated Patient    Methods Explanation;Demonstration;Verbal cues    Comprehension Verbalized understanding;Returned demonstration;Verbal cues required;Need further instruction              PT Short Term Goals - 03/10/21 0823       PT SHORT TERM GOAL #1   Title Patient will be I with initial HEP to progress with PT    Baseline independent with initial HEP    Time 3    Period Weeks    Status Achieved    Target  Date 02/18/21      PT SHORT TERM GOAL #2   Title PT will review FOTO with patient to understand expected progress with therapy    Baseline Reviewed 4th visit    Time 3    Period Weeks    Status Achieved    Target Date 02/18/21      PT SHORT TERM GOAL #3   Title Patient will be able to ambulate community level distances without AD    Baseline patient ambulating without AD    Time 3    Period Weeks    Status Achieved    Target Date 02/18/21               PT Long Term Goals - 03/10/21 0823       PT LONG TERM GOAL #1   Title Patient will be I with final HEP to maintain progress from PT    Baseline progressing with HEP    Time 4    Period Weeks    Status On-going    Target Date 04/07/21      PT LONG TERM GOAL #2   Title Patient will report >/= 66% status on FOTO to indicate improve funtional ability    Baseline 90% functional status    Time --    Period --    Status Achieved    Target Date --      PT LONG TERM GOAL #3   Title Patient will exhibit 5/5 MMT knee strength and >/= 4/5 MMT hip strength to allow for return to work without limitation    Baseline patient continues to demonstrate hip strength limitation and reports pulling of right hamstring with strength testing    Time 4    Period Weeks    Status On-going    Target Date 04/07/21      PT LONG TERM GOAL #4   Title Patient will be able to lift >/= 30lbs from floor without increase in pain t allow for return to work    Baseline patient able to lift 45# without increase pain    Time --    Period --    Status Achieved  Plan - 03/10/21 4315     Clinical Impression Statement Patient tolerated therapy well with no adverse effects. He is progressing well toward his established goals, demonstrating improved strength, flexibility, functional ability, and continued progress of HEP and working ability. He does continues to report slight pulling sensation in the hamstring with activity and  demonstrates slight hip weakness on right. He has not returned to full activity at work and reports fatigue of legs with greater levels of activity. Therapy continues to focus on progressing strength of legs and hamstring tolerance for activity. Patient would benefit from continued skilled PT to progress his mobility and strength in order to reduce pain and maximize functional ability with return to work.    PT Frequency 1x / week    PT Duration 4 weeks    PT Treatment/Interventions ADLs/Self Care Home Management;Aquatic Therapy;Cryotherapy;Electrical Stimulation;Iontophoresis 4mg /ml Dexamethasone;Moist Heat;Traction;Ultrasound;Neuromuscular re-education;Balance training;Therapeutic exercise;Therapeutic activities;Functional mobility training;Stair training;Gait training;Patient/family education;Manual techniques;Dry needling;Passive range of motion;Taping;Vasopneumatic Device;Spinal Manipulations;Joint Manipulations    PT Next Visit Plan Review HEP and progress PRN, progress hamstring strengthening as tolerated    PT Home Exercise Plan [id number]    Consulted and Agree with Plan of Care Patient             Patient will benefit from skilled therapeutic intervention in order to improve the following deficits and impairments:  Abnormal gait, Decreased range of motion, Difficulty walking, Pain, Decreased activity tolerance, Decreased strength, Impaired flexibility  Visit Diagnosis: Pain in right leg  Muscle weakness (generalized)  Other abnormalities of gait and mobility     Problem List Patient Active Problem List   Diagnosis Date Noted   Leukopenia 05/08/2020   Erectile dysfunction 09/01/2017   Premature ejaculation 03/13/2017   Seasonal allergies 07/29/2016   Hypertension 07/29/2016   Decreased testosterone level 06/15/2016   Ganglion cyst of wrist, left 06/15/2016   Elevated fasting blood sugar 06/15/2016   Varicose veins of bilateral lower extremities with other complications  40/11/6759    Hilda Blades, PT, DPT, LAT, ATC 03/10/21  9:15 AM Phone: [phone] Fax: [facility] [address] [place], [place], [address] Phone: [phone]   Fax:  [phone]  Name: Jerry Crawford MRN: [id number] Date of Birth: [birth year]

## 2021-03-17 ENCOUNTER — Ambulatory Visit: Payer: 59 | Attending: Sports Medicine | Admitting: Physical Therapy

## 2021-03-17 ENCOUNTER — Encounter: Payer: Self-pay | Admitting: Physical Therapy

## 2021-03-17 ENCOUNTER — Other Ambulatory Visit: Payer: Self-pay

## 2021-03-17 DIAGNOSIS — R2689 Other abnormalities of gait and mobility: Secondary | ICD-10-CM | POA: Insufficient documentation

## 2021-03-17 DIAGNOSIS — M79604 Pain in right leg: Secondary | ICD-10-CM | POA: Insufficient documentation

## 2021-03-17 DIAGNOSIS — M6281 Muscle weakness (generalized): Secondary | ICD-10-CM | POA: Diagnosis present

## 2021-03-17 NOTE — Therapy (Signed)
[place], [place], [address] Phone: [phone]   Fax:  [phone]  Physical Therapy Treatment  Patient Details  Name: Jerry Crawford MRN: [id number] Date of Birth: Apr 17, 1960 Referring Provider (PT): Glennon Mac, DO   Encounter Date: 03/17/2021   PT End of Session - 03/17/21 1706     Visit Number 10    Number of Visits 13    Date for PT Re-Evaluation 04/07/21    Authorization Type [employer]et[employer]    PT Start Time 1700    PT Stop Time 8588    PT Time Calculation (min) 40 min    Activity Tolerance Patient tolerated treatment well    Behavior During Therapy [facility] for tasks assessed/performed             Past Medical History:  Diagnosis Date   Allergy    Hypercholesteremia    Hyperlipidemia    Hypertension     Past Surgical History:  Procedure Laterality Date   COLONOSCOPY  06/02/2017   per Dr. Fuller Plan, adenomatous polyp, repeat in 5 yrs    ENDOVENOUS ABLATION SAPHENOUS VEIN W/ LASER Left 11/08/2016   endovenous laser ablation left small saphenous vein by Tinnie Gens MD    ENDOVENOUS [place] W/ LASER Left 11/23/2016   endovenous laser ablation left greater saphenous vein and stab phlebectomy > 20 incisions left leg by Tinnie Gens MD   ENDOVENOUS [place] W/ LASER Right 12/26/2016   endovenous laser ablation R greater saphenous vein and stab phlebectomy > 20 incisions R leg by Tinnie Gens MD     There were no vitals filed for this visit.   Subjective Assessment - 03/17/21 1704     Subjective Patient reports he is doing better. Denies any pain. He has returned to full duty at work with no new issues.    Patient Stated Goals Pain relief and get back to work    Currently in Pain? No/denies                [facility] PT Assessment - 03/17/21 0001       Strength   Right Knee Flexion 5/5                  [facility] Adult PT Treatment/Exercise:    Therapeutic Exercise: Elliptical L5 R1 x 5 min while taking subjective Bridge with physioball hamstring curl 3 x 8 Sidelying hip abduction with 4# ankle weights 2 x 15 Barbell deadlift 95# 3 x 5 - lift from 4" height Cable lift / chop 10# x 10 each Sled push / pull 80# 3 x 1 min   Manual Therapy: N/A   Neuromuscular re-ed: N/A   Therapeutic Activity: N/A   Modalities: N/A   Self Care: N/A                  PT Education - 03/17/21 1705     Education Details HEP    Person(s) Educated Patient    Methods Explanation;Demonstration;Verbal cues    Comprehension Verbalized understanding;Returned demonstration;Verbal cues required;Need further instruction              PT Short Term Goals - 03/10/21 0823       PT SHORT TERM GOAL #1   Title Patient will be I with initial HEP to progress with PT    Baseline independent with initial HEP    Time 3    Period Weeks    Status Achieved  Target Date 02/18/21      PT SHORT TERM GOAL #2   Title PT will review FOTO with patient to understand expected progress with therapy    Baseline Reviewed 4th visit    Time 3    Period Weeks    Status Achieved    Target Date 02/18/21      PT SHORT TERM GOAL #3   Title Patient will be able to ambulate community level distances without AD    Baseline patient ambulating without AD    Time 3    Period Weeks    Status Achieved    Target Date 02/18/21               PT Long Term Goals - 03/10/21 0823       PT LONG TERM GOAL #1   Title Patient will be I with final HEP to maintain progress from PT    Baseline progressing with HEP    Time 4    Period Weeks    Status On-going    Target Date 04/07/21      PT LONG TERM GOAL #2   Title Patient will report >/= 66% status on FOTO to indicate improve funtional ability    Baseline 90% functional status    Time --    Period --    Status Achieved    Target Date --      PT LONG TERM GOAL #3   Title Patient will  exhibit 5/5 MMT knee strength and >/= 4/5 MMT hip strength to allow for return to work without limitation    Baseline patient continues to demonstrate hip strength limitation and reports pulling of right hamstring with strength testing    Time 4    Period Weeks    Status On-going    Target Date 04/07/21      PT LONG TERM GOAL #4   Title Patient will be able to lift >/= 30lbs from floor without increase in pain t allow for return to work    Baseline patient able to lift 45# without increase pain    Time --    Period --    Status Achieved                   Plan - 03/17/21 1706     Clinical Impression Statement Patient tolerated therapy well with no adverse effects. Therapy continues to focus on progressing strength with good toelrance. He continues to note fatigue of the right leg at work and slight hesitancy with any heavy activities at work. He did not report any hamstring pain with therapy and is tolerating increases in weight/resistance well.  Patient would benefit from continued skilled PT to progress his mobility and strength in order to reduce pain and maximize functional ability with return to work.    PT Treatment/Interventions ADLs/Self Care Home Management;Aquatic Therapy;Cryotherapy;Electrical Stimulation;Iontophoresis 4mg /ml Dexamethasone;Moist Heat;Traction;Ultrasound;Neuromuscular re-education;Balance training;Therapeutic exercise;Therapeutic activities;Functional mobility training;Stair training;Gait training;Patient/family education;Manual techniques;Dry needling;Passive range of motion;Taping;Vasopneumatic Device;Spinal Manipulations;Joint Manipulations    PT Next Visit Plan Review HEP and progress PRN, progress hamstring strengthening as tolerated    PT Home Exercise Plan [email or web address]    Consulted and Agree with Plan of Care Patient             Patient will benefit from skilled therapeutic intervention in order to improve the following deficits and impairments:   Abnormal gait, Decreased range of motion, Difficulty walking, Pain, Decreased activity tolerance, Decreased strength, Impaired flexibility  Visit Diagnosis:  Pain in right leg  Muscle weakness (generalized)  Other abnormalities of gait and mobility     Problem List Patient Active Problem List   Diagnosis Date Noted   Leukopenia 05/08/2020   Erectile dysfunction 09/01/2017   Premature ejaculation 03/13/2017   Seasonal allergies 07/29/2016   Hypertension 07/29/2016   Decreased testosterone level 06/15/2016   Ganglion cyst of wrist, left 06/15/2016   Elevated fasting blood sugar 06/15/2016   Varicose veins of bilateral lower extremities with other complications 85/05/7739    Hilda Blades, PT, DPT, LAT, ATC 03/17/21  5:44 PM Phone: [phone] Fax: [facility] [address] [place], [place], [address] Phone: [phone]   Fax:  [phone]  Name: Jerry Crawford MRN: [id number] Date of Birth: Oct 04, 1960

## 2021-03-18 DIAGNOSIS — E785 Hyperlipidemia, unspecified: Secondary | ICD-10-CM | POA: Insufficient documentation

## 2021-03-18 DIAGNOSIS — R000 Tachycardia, unspecified: Secondary | ICD-10-CM | POA: Insufficient documentation

## 2021-03-18 NOTE — Progress Notes (Signed)
Cardiology Office Note   Date:  03/19/2021   ID:  Jerry, Crawford 06-17-1960, MRN [id number]  PCP:  Laurey Morale, MD  Cardiologist:   Minus Breeding, MD Referring:  Laurey Morale, MD  Chief Complaint  Patient presents with   Shortness of Breath      History of Present Illness: Jerry Crawford is a 60 y.o. male who presents for evaluation of tachycardia.   He was referred by Dr. Sarajane Jews.  He has no past cardiac history.  However, he does have cardiovascular risk factors as below.  He recently had an injury to his hamstring.  It really put him back and he was using crutches and a cane.  He had to use physical therapy.  He put on about 10 pounds during all of this and was not able to be physically active and had to be out of his job.  He said around this time he started noticing some shortness of breath doing things like walking up a flight of stairs.  He would feel his heart beating in his ears.  He did have his amlodipine stopped and beta-blocker started this seemed to help a little with the heart beating in his ears but he still short of breath.  He feels this with activity such as climbing stairs or walking briskly.  He thinks physical therapy wears a mile a little bit more than he thought it should.  He is not having any chest pressure, neck or arm discomfort.  He does notice his heart rate going faster than it used to with activities but he is not describing any resting tachycardias, presyncope or syncope.  He has had no prior cardiac work-up.   Past Medical History:  Diagnosis Date   Allergy    Hypercholesteremia    Hyperlipidemia    Hypertension     Past Surgical History:  Procedure Laterality Date   COLONOSCOPY  06/02/2017   per Dr. Fuller Plan, adenomatous polyp, repeat in 5 yrs    ENDOVENOUS ABLATION SAPHENOUS VEIN W/ LASER Left 11/08/2016   endovenous laser ablation left small saphenous vein by Tinnie Gens MD    ENDOVENOUS [place] W/ LASER  Left 11/23/2016   endovenous laser ablation left greater saphenous vein and stab phlebectomy > 20 incisions left leg by Tinnie Gens MD   ENDOVENOUS ABLATION SAPHENOUS VEIN W/ LASER Right 12/26/2016   endovenous laser ablation R greater saphenous vein and stab phlebectomy > 20 incisions R leg by Tinnie Gens MD      Current Outpatient Medications  Medication Sig Dispense Refill   azelastine (OPTIVAR) 0.05 % ophthalmic solution Place 1 drop into both eyes 2 (two) times daily. 6 mL 11   desonide (DESOWEN) 0.05 % lotion Apply topically 2 (two) times daily. 59 mL 11   loratadine (CLARITIN) 10 MG tablet Take 10 mg by mouth daily.     metoprolol succinate (TOPROL-XL) 50 MG 24 hr tablet Take 1 tablet (50 mg total) by mouth daily. Take with or immediately following a meal. 30 tablet 3   Multiple Vitamin (MULTIVITAMIN WITH MINERALS) TABS Take 1 tablet by mouth daily.     simvastatin (ZOCOR) 20 MG tablet TAKE 1 TABLET BY MOUTH EVERY DAY 30 tablet 11   tacrolimus (PROTOPIC) 0.1 % ointment APPLY TOPICALLY TO THE AFFECTED AREA TWICE DAILY 60 g 5   tadalafil (CIALIS) 20 MG tablet TAKE 1 TABLET(20 MG) BY MOUTH DAILY AS NEEDED FOR ERECTILE DYSFUNCTION 10 tablet 6  Tetrahydrozoline HCl (EYE DROPS OP) Apply 2 drops to eye as needed (for redness in eyes).     traMADol (ULTRAM) 50 MG tablet Take 1 tablet (50 mg total) by mouth every 6 (six) hours as needed. 60 tablet 1   triamcinolone cream (KENALOG) 0.1 % Apply 1 application topically 2 (two) times daily. 45 g 2   triamterene-hydrochlorothiazide (MAXZIDE-25) 37.5-25 MG tablet TAKE 1 TABLET BY MOUTH DAILY 30 tablet 4   meloxicam (MOBIC) 15 MG tablet Take 1 tablet (15 mg total) by mouth daily. (Patient not taking: Reported on 03/19/2021) 30 tablet 0   Current Facility-Administered Medications  Medication Dose Route Frequency Provider Last Rate Last Admin   0.9 %  sodium chloride infusion  500 mL Intravenous Once Ladene Artist, MD        Allergies:    Patient has no known allergies.    Social History:  The patient  reports that he has never smoked. He has never used smokeless tobacco. He reports current alcohol use. He reports that he does not use drugs.   Family History:  The patient's family history includes Asthma in his brother, mother, and sister; Healthy in his father; Hypertension in his mother and sister.    ROS:  Please see the history of present illness.   Otherwise, review of systems are positive for none.   All other systems are reviewed and negative.    PHYSICAL EXAM: VS:  BP 122/79   Pulse 81   Ht 5\' 11"  (1.803 m)   Wt 217 lb 9.6 oz (98.7 kg)   SpO2 98%   BMI 30.35 kg/m  , BMI Body mass index is 30.35 kg/m. GENERAL:  Well appearing HEENT:  Pupils equal round and reactive, fundi not visualized, oral mucosa unremarkable NECK:  No jugular venous distention, waveform within normal limits, carotid upstroke brisk and symmetric, no bruits, no thyromegaly LYMPHATICS:  No cervical, inguinal adenopathy LUNGS:  Clear to auscultation bilaterally BACK:  No CVA tenderness CHEST:  Unremarkable HEART:  PMI not displaced or sustained,S1 and S2 within normal limits, no S3, no S4, no clicks, no rubs, no murmurs ABD:  Flat, positive bowel sounds normal in frequency in pitch, no bruits, no rebound, no guarding, no midline pulsatile mass, no hepatomegaly, no splenomegaly EXT:  2 plus pulses throughout, no edema, no cyanosis no clubbing SKIN:  No rashes no nodules NEURO:  Cranial nerves II through XII grossly intact, motor grossly intact throughout PSYCH:  Cognitively intact, oriented to person place and time    EKG:  EKG is ordered today. The ekg ordered today demonstrates sinus rhythm, rate 81, axis within normal limits, intervals within normal limits, no acute ST-T wave changes.   Recent Labs: 06/26/2020: ALT 22 02/25/2021: BUN 21; Creatinine, Ser 1.07; Potassium 3.8; Sodium 138; TSH 1.39 02/26/2021: Hemoglobin 16.2; Platelet  Count 221    Lipid Panel    Component Value Date/Time   CHOL 99 06/26/2020 0735   TRIG 62.0 06/26/2020 0735   HDL 41.60 06/26/2020 0735   CHOLHDL 2 06/26/2020 0735   VLDL 12.4 06/26/2020 0735   LDLCALC 45 06/26/2020 0735      Wt Readings from Last 3 Encounters:  03/19/21 217 lb 9.6 oz (98.7 kg)  02/26/21 213 lb 14.4 oz (97 kg)  02/24/21 216 lb 6 oz (98.1 kg)      Other studies Reviewed: Additional studies/ records that were reviewed today include: Labs. Review of the above records demonstrates:  Please see elsewhere in the  note.     ASSESSMENT AND PLAN:  TACHYCARDIA:   This seems to be improved with the beta-blocker.  I am going to suggest that he increase his walking and loses to 10 pounds again as he is getting over his injury and if his heart rate or shortness of breath worsens then I would have a low threshold to do further testing.  I suspect this is related to some weight and deconditioning.  He can continue the beta-blocker.  SOB: This will be managed as above.  In addition I will order a coronary calcium score for screening.  HTN: His blood pressure is controlled on the meds as listed.  No change in therapy.  DYSLIPIDEMIA: LDL is 45 with an HDL of 41.6.  This is excellent.  No change in therapy.   Current medicines are reviewed at length with the patient today.  The patient does not have concerns regarding medicines.  The following changes have been made:  no change  Labs/ tests ordered today include:   Orders Placed This Encounter  Procedures   CT CARDIAC SCORING   EKG 12-Lead      Disposition:   FU with me as needed.   Signed, Minus Breeding, MD  03/19/2021 1:41 PM    [place]

## 2021-03-19 ENCOUNTER — Other Ambulatory Visit: Payer: Self-pay

## 2021-03-19 ENCOUNTER — Ambulatory Visit: Payer: 59 | Admitting: Cardiology

## 2021-03-19 ENCOUNTER — Encounter: Payer: Self-pay | Admitting: Cardiology

## 2021-03-19 VITALS — BP 122/79 | HR 81 | Ht 71.0 in | Wt 217.6 lb

## 2021-03-19 DIAGNOSIS — I10 Essential (primary) hypertension: Secondary | ICD-10-CM | POA: Diagnosis not present

## 2021-03-19 DIAGNOSIS — E1169 Type 2 diabetes mellitus with other specified complication: Secondary | ICD-10-CM | POA: Diagnosis not present

## 2021-03-19 DIAGNOSIS — R000 Tachycardia, unspecified: Secondary | ICD-10-CM | POA: Diagnosis not present

## 2021-03-19 DIAGNOSIS — E785 Hyperlipidemia, unspecified: Secondary | ICD-10-CM

## 2021-03-19 DIAGNOSIS — E119 Type 2 diabetes mellitus without complications: Secondary | ICD-10-CM | POA: Insufficient documentation

## 2021-03-19 NOTE — Patient Instructions (Addendum)
Medication Instructions:  Your Physician recommend you continue on your current medication as directed.    *If you need a refill on your cardiac medications before your next appointment, please call your pharmacy*   Testing/Procedures: Dr. Percival Spanish, MD has ordered a CT coronary calcium score. This test is done at [address]. This is $99 out of pocket.   Coronary CalciumScan A coronary calcium scan is an imaging test used to look for deposits of calcium and other fatty materials (plaques) in the inner lining of the blood vessels of the heart (coronary arteries). These deposits of calcium and plaques can partly clog and narrow the coronary arteries without producing any symptoms or warning signs. This puts a person at risk for a heart attack. This test can detect these deposits before symptoms develop. Tell a health care provider about: Any allergies you have. All medicines you are taking, including vitamins, herbs, eye drops, creams, and over-the-counter medicines. Any problems you or family members have had with anesthetic medicines. Any blood disorders you have. Any surgeries you have had. Any medical conditions you have. Whether you are pregnant or may be pregnant. What are the risks? Generally, this is a safe procedure. However, problems may occur, including: Harm to a pregnant woman and her unborn baby. This test involves the use of radiation. Radiation exposure can be dangerous to a pregnant woman and her unborn baby. If you are pregnant, you generally should not have this procedure done. Slight increase in the risk of cancer. This is because of the radiation involved in the test. What happens before the procedure? No preparation is needed for this procedure. What happens during the procedure? You will undress and remove any jewelry around your neck or chest. You will put on a hospital gown. Sticky electrodes will be placed on your chest. The electrodes will be  connected to an electrocardiogram (ECG) machine to record a tracing of the electrical activity of your heart. A CT scanner will take pictures of your heart. During this time, you will be asked to lie still and hold your breath for 2-3 seconds while a picture of your heart is being taken. The procedure may vary among health care providers and hospitals. What happens after the procedure? You can get dressed. You can return to your normal activities. It is up to you to get the results of your test. Ask your health care provider, or the department that is doing the test, when your results will be ready. Summary A coronary calcium scan is an imaging test used to look for deposits of calcium and other fatty materials (plaques) in the inner lining of the blood vessels of the heart (coronary arteries). Generally, this is a safe procedure. Tell your health care provider if you are pregnant or may be pregnant. No preparation is needed for this procedure. A CT scanner will take pictures of your heart. You can return to your normal activities after the scan is done. This information is not intended to replace advice given to you by your health care provider. Make sure you discuss any questions you have with your health care provider. Document Released: 09/24/2007 Document Revised: 02/15/2016 Document Reviewed: 02/15/2016 Elsevier Interactive Patient Education  2017 [place]: At [facility], you and your health needs are our priority.  As part of our continuing mission to provide you with exceptional heart care, we have created designated Provider Care Teams.  These Care Teams include your primary  Cardiologist (physician) and Advanced Practice Providers (APPs -  Physician Assistants and Nurse Practitioners) who all work together to provide you with the care you need, when you need it.  We recommend signing up for the patient portal called "MyChart".  Sign up information is provided on  this After Visit Summary.  MyChart is used to connect with patients for Virtual Visits (Telemedicine).  Patients are able to view lab/test results, encounter notes, upcoming appointments, etc.  Non-urgent messages can be sent to your provider as well.   To learn more about what you can do with MyChart, go to [email or web address].    Your next appointment:    Follow up as needed.  The format for your next appointment:   In Person  Provider:   Dr. Vita Barley, MD

## 2021-03-23 ENCOUNTER — Ambulatory Visit: Payer: 59 | Admitting: Physical Therapy

## 2021-03-23 ENCOUNTER — Encounter: Payer: Self-pay | Admitting: Physical Therapy

## 2021-03-23 ENCOUNTER — Other Ambulatory Visit: Payer: Self-pay

## 2021-03-23 DIAGNOSIS — R2689 Other abnormalities of gait and mobility: Secondary | ICD-10-CM

## 2021-03-23 DIAGNOSIS — M79604 Pain in right leg: Secondary | ICD-10-CM

## 2021-03-23 DIAGNOSIS — M6281 Muscle weakness (generalized): Secondary | ICD-10-CM

## 2021-03-23 NOTE — Therapy (Signed)
[place], [place], [address] Phone: [phone]   Fax:  [phone]  Physical Therapy Treatment  Patient Details  Name: Jerry Crawford MRN: [id number] Date of Birth: Dec 16, 1960 Referring Provider (PT): Glennon Mac, DO   Encounter Date: 03/23/2021   PT End of Session - 03/23/21 0835     Visit Number 11    Number of Visits 13    Date for PT Re-Evaluation 04/07/21    Authorization Type [employer]    PT Start Time 0831    PT Stop Time 0915    PT Time Calculation (min) 44 min    Activity Tolerance Patient tolerated treatment well    Behavior During Therapy [facility] for tasks assessed/performed             Past Medical History:  Diagnosis Date   Allergy    Hypercholesteremia    Hyperlipidemia    Hypertension     Past Surgical History:  Procedure Laterality Date   COLONOSCOPY  06/02/2017   per Dr. Fuller Plan, adenomatous polyp, repeat in 5 yrs    ENDOVENOUS ABLATION SAPHENOUS VEIN W/ LASER Left 11/08/2016   endovenous laser ablation left small saphenous vein by Tinnie Gens MD    ENDOVENOUS [place] W/ LASER Left 11/23/2016   endovenous laser ablation left greater saphenous vein and stab phlebectomy > 20 incisions left leg by Tinnie Gens MD   ENDOVENOUS [place] W/ LASER Right 12/26/2016   endovenous laser ablation R greater saphenous vein and stab phlebectomy > 20 incisions R leg by Tinnie Gens MD     There were no vitals filed for this visit.   Subjective Assessment - 03/23/21 0836     Subjective Patient reports he is doing well with no new issues. States he feels he is getting back to normal at work.    Patient Stated Goals Pain relief and get back to work    Currently in Pain? No/denies                [facility] PT Assessment - 03/23/21 0001       Strength   Right Knee Flexion 5/5    Right Knee Extension 5/5                 [facility] Adult PT  Treatment/Exercise:  Therapeutic Exercise: Elliptical L5 R1 x 5 min while taking subjective Leg press (cybex): 120# x 10, 160# 3 x 6 Lateral band walk with black at knees 3 x 20 TRX bridge with hamstring curl 3 x 8 Runner step-up 12" 2 x 10 each Fwd slider 2 x 6 each Nordic hamstring x 6                PT Education - 03/23/21 0837     Education Details HEP    Person(s) Educated Patient    Methods Explanation;Demonstration;Verbal cues    Comprehension Verbalized understanding;Returned demonstration;Verbal cues required;Need further instruction              PT Short Term Goals - 03/10/21 0823       PT SHORT TERM GOAL #1   Title Patient will be I with initial HEP to progress with PT    Baseline independent with initial HEP    Time 3    Period Weeks    Status Achieved    Target Date 02/18/21      PT SHORT TERM GOAL #2   Title PT will review FOTO  with patient to understand expected progress with therapy    Baseline Reviewed 4th visit    Time 3    Period Weeks    Status Achieved    Target Date 02/18/21      PT SHORT TERM GOAL #3   Title Patient will be able to ambulate community level distances without AD    Baseline patient ambulating without AD    Time 3    Period Weeks    Status Achieved    Target Date 02/18/21               PT Long Term Goals - 03/10/21 0823       PT LONG TERM GOAL #1   Title Patient will be I with final HEP to maintain progress from PT    Baseline progressing with HEP    Time 4    Period Weeks    Status On-going    Target Date 04/07/21      PT LONG TERM GOAL #2   Title Patient will report >/= 66% status on FOTO to indicate improve funtional ability    Baseline 90% functional status    Time --    Period --    Status Achieved    Target Date --      PT LONG TERM GOAL #3   Title Patient will exhibit 5/5 MMT knee strength and >/= 4/5 MMT hip strength to allow for return to work without limitation    Baseline  patient continues to demonstrate hip strength limitation and reports pulling of right hamstring with strength testing    Time 4    Period Weeks    Status On-going    Target Date 04/07/21      PT LONG TERM GOAL #4   Title Patient will be able to lift >/= 30lbs from floor without increase in pain t allow for return to work    Baseline patient able to lift 45# without increase pain    Time --    Period --    Status Achieved                   Plan - 03/23/21 7425     Clinical Impression Statement Patient tolerated therapy well with no adverse effects. Therapy continues to focus on progressing strength and activity tolerance. Patient denies any pain with therapy but does note right side fatigues quicker than left. No changes made to HEP. Patient would benefit from continued skilled PT to progress his mobility and strength in order to reduce pain and maximize functional ability with return to work.    PT Treatment/Interventions ADLs/Self Care Home Management;Aquatic Therapy;Cryotherapy;Electrical Stimulation;Iontophoresis 4mg /ml Dexamethasone;Moist Heat;Traction;Ultrasound;Neuromuscular re-education;Balance training;Therapeutic exercise;Therapeutic activities;Functional mobility training;Stair training;Gait training;Patient/family education;Manual techniques;Dry needling;Passive range of motion;Taping;Vasopneumatic Device;Spinal Manipulations;Joint Manipulations    PT Next Visit Plan Review HEP and progress PRN, progress hamstring strengthening as tolerated    PT Home Exercise Plan [id number]    Consulted and Agree with Plan of Care Patient             Patient will benefit from skilled therapeutic intervention in order to improve the following deficits and impairments:  Abnormal gait, Decreased range of motion, Difficulty walking, Pain, Decreased activity tolerance, Decreased strength, Impaired flexibility  Visit Diagnosis: Pain in right leg  Muscle weakness (generalized)  Other  abnormalities of gait and mobility     Problem List Patient Active Problem List   Diagnosis Date Noted   T2DM (type 2 diabetes mellitus) (  Secretary) 03/19/2021   Tachycardia 03/18/2021   Dyslipidemia 03/18/2021   Leukopenia 05/08/2020   Erectile dysfunction 09/01/2017   Premature ejaculation 03/13/2017   Seasonal allergies 07/29/2016   Hypertension 07/29/2016   Decreased testosterone level 06/15/2016   Ganglion cyst of wrist, left 06/15/2016   Elevated fasting blood sugar 06/15/2016   Varicose veins of bilateral lower extremities with other complications 57/97/2820    Hilda Blades, PT, DPT, LAT, ATC 03/23/21  9:17 AM Phone: [phone] Fax: [place] [facility] [address] [place], [place], [address] Phone: [phone]   Fax:  [phone]  Name: Jerry Crawford MRN: [id number] Date of Birth: 1960-08-16

## 2021-03-29 ENCOUNTER — Encounter: Payer: Self-pay | Admitting: Physical Therapy

## 2021-03-29 ENCOUNTER — Ambulatory Visit: Payer: 59 | Admitting: Physical Therapy

## 2021-03-29 ENCOUNTER — Other Ambulatory Visit: Payer: Self-pay

## 2021-03-29 DIAGNOSIS — M6281 Muscle weakness (generalized): Secondary | ICD-10-CM

## 2021-03-29 DIAGNOSIS — M79604 Pain in right leg: Secondary | ICD-10-CM | POA: Diagnosis not present

## 2021-03-29 DIAGNOSIS — R2689 Other abnormalities of gait and mobility: Secondary | ICD-10-CM

## 2021-03-29 NOTE — Therapy (Signed)
[place], [place], [address] Phone: [phone]   Fax:  [phone]  Physical Therapy Treatment / Discharge  Patient Details  Name: Jerry Crawford MRN: [id number] Date of Birth: [birth year] Referring Provider (PT): Glennon Mac, DO   Encounter Date: 03/29/2021   PT End of Session - 03/29/21 0832     Visit Number 12    Number of Visits 13    Date for PT Re-Evaluation 04/07/21    Authorization Type [employer]    PT Start Time 0830    PT Stop Time 0910    PT Time Calculation (min) 40 min    Activity Tolerance Patient tolerated treatment well    Behavior During Therapy [facility] for tasks assessed/performed             Past Medical History:  Diagnosis Date   Allergy    Hypercholesteremia    Hyperlipidemia    Hypertension     Past Surgical History:  Procedure Laterality Date   COLONOSCOPY  06/02/2017   per Dr. Fuller Plan, adenomatous polyp, repeat in 5 yrs    ENDOVENOUS ABLATION SAPHENOUS VEIN W/ LASER Left 11/08/2016   endovenous laser ablation left small saphenous vein by Tinnie Gens MD    ENDOVENOUS [place] W/ LASER Left 11/23/2016   endovenous laser ablation left greater saphenous vein and stab phlebectomy > 20 incisions left leg by Tinnie Gens MD   ENDOVENOUS [place] W/ LASER Right 12/26/2016   endovenous laser ablation R greater saphenous vein and stab phlebectomy > 20 incisions R leg by Tinnie Gens MD     There were no vitals filed for this visit.   Subjective Assessment - 03/29/21 0833     Subjective Patient reports he continues to do well with no new issues. States he is working full capacity at work with no increase in pain or tightness. He is consistent with HEP and feels he has no limitations with activity.    Patient Stated Goals Pain relief and get back to work    Currently in Pain? No/denies                [facility] PT Assessment - 03/29/21  0001       Assessment   Medical Diagnosis Right hamstring strain    Referring Provider (PT) Glennon Mac, DO    Onset Date/Surgical Date 01/03/21      Precautions   Precautions None      Restrictions   Weight Bearing Restrictions No      Prior Function   Level of Independence Independent      Cognition   Overall Cognitive Status Within Functional Limits for tasks assessed      Observation/Other Assessments   Focus on Therapeutic Outcomes (FOTO)  90% functional status   assessed 03/10/21     Strength   Right Hip Extension 4/5    Right Hip ABduction 4/5    Left Hip Extension 4/5    Left Hip ABduction 4/5    Right Knee Flexion 5/5    Right Knee Extension 5/5      Flexibility   Hamstrings WFL - equal bilaterally                  [facility] Adult PT Treatment/Exercise:   Therapeutic Exercise: Elliptical L5 R1 x 5 min while taking subjective Leg press (cybex): 120# 3 x 8 Kickstand RDL with 25# dumbbells 3 x 8 Lateral band walk with black at  knees 3 x 30 SL bridge 2 x 6 each Forward lunge 2 x 6 each          PT Education - 03/29/21 0834     Education Details POC discharge, HEP    Person(s) Educated Patient    Methods Explanation    Comprehension Verbalized understanding              PT Short Term Goals - 03/10/21 0823       PT SHORT TERM GOAL #1   Title Patient will be I with initial HEP to progress with PT    Baseline independent with initial HEP    Time 3    Period Weeks    Status Achieved    Target Date 02/18/21      PT SHORT TERM GOAL #2   Title PT will review FOTO with patient to understand expected progress with therapy    Baseline Reviewed 4th visit    Time 3    Period Weeks    Status Achieved    Target Date 02/18/21      PT SHORT TERM GOAL #3   Title Patient will be able to ambulate community level distances without AD    Baseline patient ambulating without AD    Time 3    Period Weeks    Status Achieved    Target  Date 02/18/21               PT Long Term Goals - 03/29/21 0840       PT LONG TERM GOAL #1   Title Patient will be I with final HEP to maintain progress from PT    Baseline independent with HEP    Time 4    Period Weeks    Status Achieved    Target Date 04/07/21      PT LONG TERM GOAL #2   Title Patient will report >/= 66% status on FOTO to indicate improve funtional ability    Baseline 90% functional status    Status Achieved      PT LONG TERM GOAL #3   Title Patient will exhibit 5/5 MMT knee strength and >/= 4/5 MMT hip strength to allow for return to work without limitation    Baseline knee strength 5/5 and hip strength 4/5 MMT    Time 4    Period Weeks    Status Achieved    Target Date 04/07/21      PT LONG TERM GOAL #4   Title Patient will be able to lift >/= 30lbs from floor without increase in pain t allow for return to work    Baseline patient able to lift 45# without increase pain    Status Achieved                   Plan - 03/29/21 0835     Clinical Impression Statement Patient tolerated therapy well with no adverse effects. He has achieved all established goals and reports no limitations with work or other activities. He has returned to prior level of function and PT is no longer indicated. Patient will be discharged from PT.    PT Treatment/Interventions ADLs/Self Care Home Management;Aquatic Therapy;Cryotherapy;Electrical Stimulation;Iontophoresis 22m/ml Dexamethasone;Moist Heat;Traction;Ultrasound;Neuromuscular re-education;Balance training;Therapeutic exercise;Therapeutic activities;Functional mobility training;Stair training;Gait training;Patient/family education;Manual techniques;Dry needling;Passive range of motion;Taping;Vasopneumatic Device;Spinal Manipulations;Joint Manipulations    PT Next Visit Plan NA - discharge    PT Home Exercise Plan [email or web address]    Consulted and Agree with Plan of Care Patient  Patient will benefit from  skilled therapeutic intervention in order to improve the following deficits and impairments:  Abnormal gait, Decreased range of motion, Difficulty walking, Pain, Decreased activity tolerance, Decreased strength, Impaired flexibility  Visit Diagnosis: Pain in right leg  Muscle weakness (generalized)  Other abnormalities of gait and mobility     Problem List Patient Active Problem List   Diagnosis Date Noted   T2DM (type 2 diabetes mellitus) ([place]) 03/19/2021   Tachycardia 03/18/2021   Dyslipidemia 03/18/2021   Leukopenia 05/08/2020   Erectile dysfunction 09/01/2017   Premature ejaculation 03/13/2017   Seasonal allergies 07/29/2016   Hypertension 07/29/2016   Decreased testosterone level 06/15/2016   Ganglion cyst of wrist, left 06/15/2016   Elevated fasting blood sugar 06/15/2016   Varicose veins of bilateral lower extremities with other complications 42/68/3419    Hilda Blades, PT, DPT, LAT, ATC 03/29/21  9:12 AM Phone: [phone] Fax: [facility] [place], [place], [address] Phone: [phone]   Fax:  [phone]  Name: Jerry Crawford MRN: [id number] Date of Birth: Sep 01, 1960   PHYSICAL THERAPY DISCHARGE SUMMARY  Visits from Start of Care: 12  Current functional level related to goals / functional outcomes: See above   Remaining deficits: See above   Education / Equipment: HEP   Patient agrees to discharge. Patient goals were met. Patient is being discharged due to meeting the stated rehab goals.

## 2021-04-21 ENCOUNTER — Other Ambulatory Visit: Payer: 59

## 2021-04-28 ENCOUNTER — Other Ambulatory Visit: Payer: Self-pay | Admitting: Family Medicine

## 2021-04-28 ENCOUNTER — Telehealth: Payer: Self-pay | Admitting: Family Medicine

## 2021-04-28 DIAGNOSIS — E785 Hyperlipidemia, unspecified: Secondary | ICD-10-CM

## 2021-04-28 DIAGNOSIS — I10 Essential (primary) hypertension: Secondary | ICD-10-CM

## 2021-04-28 MED ORDER — TRIAMTERENE-HCTZ 37.5-25 MG PO TABS: 1.0000 | ORAL_TABLET | Freq: Every day | ORAL | 2 refills | Status: DC

## 2021-04-28 MED ORDER — SIMVASTATIN 20 MG PO TABS: 20.0000 mg | ORAL_TABLET | Freq: Every day | ORAL | 2 refills | Status: DC

## 2021-04-28 NOTE — Telephone Encounter (Signed)
Called patient, refills sent to pharmacy.   Informed patient to schedule physical  for after June 24, 2021, updated labs will be due at that time.

## 2021-04-28 NOTE — Telephone Encounter (Signed)
Pt call and stated he need a refill on simvastatin (ZOCOR) 20 MG tablet and triamterene-hydrochlorothiazide (MAXZIDE-25) 37.5-25 MG tablet  sent to  [facility] [phone] - [place], [address] AT [place] Phone:  [phone]  Fax:  [phone]

## 2021-05-18 ENCOUNTER — Ambulatory Visit (INDEPENDENT_AMBULATORY_CARE_PROVIDER_SITE_OTHER)
Admission: RE | Admit: 2021-05-18 | Discharge: 2021-05-18 | Disposition: A | Discharge status: Home / Self Care | Payer: 59 | Source: Ambulatory Visit | Attending: Cardiology | Admitting: Cardiology

## 2021-05-18 ENCOUNTER — Other Ambulatory Visit: Payer: Self-pay

## 2021-05-18 DIAGNOSIS — E785 Hyperlipidemia, unspecified: Secondary | ICD-10-CM

## 2021-05-26 ENCOUNTER — Encounter: Payer: Self-pay | Admitting: *Deleted

## 2021-05-28 ENCOUNTER — Telehealth: Payer: Self-pay | Admitting: Family Medicine

## 2021-05-28 NOTE — Telephone Encounter (Signed)
Pt is returning nancy call concerning ct cardiac results

## 2021-05-31 NOTE — Telephone Encounter (Signed)
Spoke with pt reviewed imaging results verbalized understanding

## 2021-06-08 ENCOUNTER — Other Ambulatory Visit: Payer: Self-pay

## 2021-06-08 ENCOUNTER — Telehealth: Payer: Self-pay | Admitting: Family Medicine

## 2021-06-08 MED ORDER — TACROLIMUS 0.1 % EX OINT: TOPICAL_OINTMENT | CUTANEOUS | 3 refills | Status: DC

## 2021-06-08 NOTE — Telephone Encounter (Signed)
Refill sent to [facility].  

## 2021-06-08 NOTE — Telephone Encounter (Signed)
Pt call and stated he need a refill on tacrolimus (PROTOPIC) 0.1 % ointment  sent to  [facility] [phone] - [place], [place] AT [place] Phone:  [phone]  Fax:  [phone]

## 2021-06-16 ENCOUNTER — Telehealth: Payer: Self-pay

## 2021-06-16 NOTE — Telephone Encounter (Signed)
Pt PA for Tacrolimus 0.1% ointment has bee approved by plan. Outcome ?Approvedon March 7 ?Effective from 06/15/2021 through 06/14/2022. ?Drug ?Tacrolimus 0.1% ointment ?

## 2021-06-20 ENCOUNTER — Other Ambulatory Visit: Payer: Self-pay | Admitting: Family Medicine

## 2021-07-08 ENCOUNTER — Ambulatory Visit (INDEPENDENT_AMBULATORY_CARE_PROVIDER_SITE_OTHER): Payer: BC Managed Care – PPO | Admitting: Family Medicine

## 2021-07-08 ENCOUNTER — Encounter: Payer: Self-pay | Admitting: Family Medicine

## 2021-07-08 VITALS — BP 120/86 | HR 76 | Temp 98.7°F | Ht 71.0 in | Wt 217.0 lb

## 2021-07-08 DIAGNOSIS — I10 Essential (primary) hypertension: Secondary | ICD-10-CM | POA: Diagnosis not present

## 2021-07-08 DIAGNOSIS — E785 Hyperlipidemia, unspecified: Secondary | ICD-10-CM

## 2021-07-08 DIAGNOSIS — Z23 Encounter for immunization: Secondary | ICD-10-CM | POA: Diagnosis not present

## 2021-07-08 DIAGNOSIS — Z0000 Encounter for general adult medical examination without abnormal findings: Secondary | ICD-10-CM

## 2021-07-08 LAB — BASIC METABOLIC PANEL
BUN: 21 mg/dL (ref 6–23)
CO2: 30 mEq/L (ref 19–32)
Calcium: 10 mg/dL (ref 8.4–10.5)
Chloride: 100 mEq/L (ref 96–112)
Creatinine, Ser: 1.03 mg/dL (ref 0.40–1.50)
GFR: 79.97 mL/min (ref >60.00)
Glucose, Bld: 120 mg/dL — ABNORMAL HIGH (ref 70–99)
Potassium: 4.3 mEq/L (ref 3.5–5.1)
Sodium: 139 mEq/L (ref 135–145)

## 2021-07-08 LAB — CBC WITH DIFFERENTIAL/PLATELET
Basophils Absolute: 0 10*3/uL (ref 0.0–0.1)
Basophils Relative: 0.8 % (ref 0.0–3.0)
Eosinophils Absolute: 0.1 10*3/uL (ref 0.0–0.7)
Eosinophils Relative: 2.9 % (ref 0.0–5.0)
HCT: 51.1 % (ref 39.0–52.0)
Hemoglobin: 17.2 g/dL — ABNORMAL HIGH (ref 13.0–17.0)
Lymphocytes Relative: 47 % — ABNORMAL HIGH (ref 12.0–46.0)
Lymphs Abs: 1.6 10*3/uL (ref 0.7–4.0)
MCHC: 33.7 g/dL (ref 30.0–36.0)
MCV: 88.7 fl (ref 78.0–100.0)
Monocytes Absolute: 0.4 10*3/uL (ref 0.1–1.0)
Monocytes Relative: 11.4 % (ref 3.0–12.0)
Neutro Abs: 1.3 10*3/uL — ABNORMAL LOW (ref 1.4–7.7)
Neutrophils Relative %: 37.9 % — ABNORMAL LOW (ref 43.0–77.0)
Platelets: 200 10*3/uL (ref 150.0–400.0)
RBC: 5.76 Mil/uL (ref 4.22–5.81)
RDW: 13.5 % (ref 11.5–15.5)
WBC: 3.5 10*3/uL — ABNORMAL LOW (ref 4.0–10.5)

## 2021-07-08 LAB — HEMOGLOBIN A1C: Hgb A1c MFr Bld: 6.8 % — ABNORMAL HIGH (ref 4.6–6.5)

## 2021-07-08 LAB — TSH: TSH: 1.77 u[IU]/mL (ref 0.35–5.50)

## 2021-07-08 LAB — HEPATIC FUNCTION PANEL
ALT: 26 U/L (ref 0–53)
AST: 21 U/L (ref 0–37)
Albumin: 4.8 g/dL (ref 3.5–5.2)
Alkaline Phosphatase: 51 U/L (ref 39–117)
Bilirubin, Direct: 0.3 mg/dL (ref 0.0–0.3)
Total Bilirubin: 1.5 mg/dL — ABNORMAL HIGH (ref 0.2–1.2)
Total Protein: 7.1 g/dL (ref 6.0–8.3)

## 2021-07-08 LAB — LIPID PANEL
Cholesterol: 116 mg/dL (ref 0–200)
HDL: 39.2 mg/dL (ref >39.00)
LDL Cholesterol: 49 mg/dL (ref 0–99)
NonHDL: 76.47
Total CHOL/HDL Ratio: 3
Triglycerides: 138 mg/dL (ref 0.0–149.0)
VLDL: 27.6 mg/dL (ref 0.0–40.0)

## 2021-07-08 LAB — PSA: PSA: 0.41 ng/mL (ref 0.10–4.00)

## 2021-07-08 MED ORDER — TRIAMTERENE-HCTZ 37.5-25 MG PO TABS: 1.0000 | ORAL_TABLET | Freq: Every day | ORAL | 11 refills | Status: DC

## 2021-07-08 MED ORDER — OMEPRAZOLE 40 MG PO CPDR: 40.0000 mg | DELAYED_RELEASE_CAPSULE | Freq: Every day | ORAL | 11 refills | Status: DC

## 2021-07-08 MED ORDER — SIMVASTATIN 20 MG PO TABS: 20.0000 mg | ORAL_TABLET | Freq: Every day | ORAL | 11 refills | Status: DC

## 2021-07-08 MED ORDER — METOPROLOL SUCCINATE ER 50 MG PO TB24: ORAL_TABLET | ORAL | 11 refills | Status: DC

## 2021-07-08 NOTE — Progress Notes (Signed)
? ?Subjective:  ? ? Patient ID: Jerry Crawford, male    DOB: March 26, 1961, 61 y.o.   MRN: [id number] ? ?HPI ?Here for a well exam. He feels fine except for an occasional burning sensation in the center of his abdomen. No nausea. His BMs are normal.  ? ? ?Review of Systems  ?Constitutional: Negative.   ?HENT: Negative.    ?Eyes: Negative.   ?Respiratory: Negative.    ?Cardiovascular: Negative.   ?Gastrointestinal:  Positive for abdominal pain.  ?Genitourinary: Negative.   ?Musculoskeletal: Negative.   ?Skin: Negative.   ?Neurological: Negative.   ?Psychiatric/Behavioral: Negative.    ? ?   ?Objective:  ? Physical Exam ?Constitutional:   ?   General: He is not in acute distress. ?   Appearance: Normal appearance. He is well-developed. He is not diaphoretic.  ?HENT:  ?   Head: Normocephalic and atraumatic.  ?   Right Ear: External ear normal.  ?   Left Ear: External ear normal.  ?   Nose: Nose normal.  ?   Mouth/Throat:  ?   Pharynx: No oropharyngeal exudate.  ?Eyes:  ?   General: No scleral icterus.    ?   Right eye: No discharge.     ?   Left eye: No discharge.  ?   Conjunctiva/sclera: Conjunctivae normal.  ?   Pupils: Pupils are equal, round, and reactive to light.  ?Neck:  ?   Thyroid: No thyromegaly.  ?   Vascular: No JVD.  ?   Trachea: No tracheal deviation.  ?Cardiovascular:  ?   Rate and Rhythm: Normal rate and regular rhythm.  ?   Heart sounds: Normal heart sounds. No murmur heard. ?  No friction rub. No gallop.  ?Pulmonary:  ?   Effort: Pulmonary effort is normal. No respiratory distress.  ?   Breath sounds: Normal breath sounds. No wheezing or rales.  ?Chest:  ?   Chest wall: No tenderness.  ?Abdominal:  ?   General: Bowel sounds are normal. There is no distension.  ?   Palpations: Abdomen is soft. There is no mass.  ?   Tenderness: There is no abdominal tenderness. There is no guarding or rebound.  ?Genitourinary: ?   Penis: Normal. No tenderness.   ?   Testes: Normal.  ?   Prostate: Normal.  ?   Rectum:  Normal. Guaiac result negative.  ?Musculoskeletal:     ?   General: No tenderness. Normal range of motion.  ?   Cervical back: Neck supple.  ?Lymphadenopathy:  ?   Cervical: No cervical adenopathy.  ?Skin: ?   General: Skin is warm and dry.  ?   Coloration: Skin is not pale.  ?   Findings: No erythema or rash.  ?Neurological:  ?   Mental Status: He is alert and oriented to person, place, and time.  ?   Cranial Nerves: No cranial nerve deficit.  ?   Motor: No abnormal muscle tone.  ?   Coordination: Coordination normal.  ?   Deep Tendon Reflexes: Reflexes are normal and symmetric. Reflexes normal.  ?Psychiatric:     ?   Behavior: Behavior normal.     ?   Thought Content: Thought content normal.     ?   Judgment: Judgment normal.  ? ? ? ? ? ?   ?Assessment & Plan:  ?Well exam. We discussed diet and exercise. Get fasting labs. For GERD he will try Omeprazole 40 mg daily.  ?Alysia Penna,  MD ? ? ?

## 2021-07-26 ENCOUNTER — Telehealth: Payer: Self-pay | Admitting: Family Medicine

## 2021-07-26 NOTE — Telephone Encounter (Signed)
Pt call and stated he need a refill on triamcinolone cream (KENALOG) 0.1 % ,metoprolol succinate (TOPROL-XL) 50 MG 24 hr tablet and simvastatin (ZOCOR) 20 MG tablet sent to  ?[facility] [phone] - [place], [address] AT [place] Phone:  [phone]  ?Fax:  [phone]  ?  ? ?

## 2021-07-26 NOTE — Telephone Encounter (Signed)
Spoke with patient to inform that each medication that was requested was sent to pharmacy on 07/08/2021 for 1 year supply.  ?

## 2021-08-17 DIAGNOSIS — I8311 Varicose veins of right lower extremity with inflammation: Secondary | ICD-10-CM | POA: Diagnosis not present

## 2021-08-17 DIAGNOSIS — I8312 Varicose veins of left lower extremity with inflammation: Secondary | ICD-10-CM | POA: Diagnosis not present

## 2021-08-17 DIAGNOSIS — R600 Localized edema: Secondary | ICD-10-CM | POA: Diagnosis not present

## 2021-09-01 DIAGNOSIS — I8312 Varicose veins of left lower extremity with inflammation: Secondary | ICD-10-CM | POA: Diagnosis not present

## 2021-09-01 DIAGNOSIS — I8311 Varicose veins of right lower extremity with inflammation: Secondary | ICD-10-CM | POA: Diagnosis not present

## 2021-10-23 ENCOUNTER — Other Ambulatory Visit: Payer: Self-pay | Admitting: Family Medicine

## 2021-11-03 ENCOUNTER — Telehealth: Payer: Self-pay | Admitting: Family Medicine

## 2021-11-03 NOTE — Telephone Encounter (Signed)
Pt called to ask if MD would be willing to take on Pt's son as a new patient. He is 61 years old.   Please advise [phone]

## 2021-11-04 NOTE — Telephone Encounter (Signed)
Left detailed message for pt father to call the office and schedule a New pt appointment

## 2021-11-04 NOTE — Telephone Encounter (Signed)
Pt  son has been sch for 11-08-2021

## 2021-11-04 NOTE — Telephone Encounter (Signed)
Yes I can see him  ?

## 2021-11-27 ENCOUNTER — Other Ambulatory Visit: Payer: Self-pay | Admitting: Family Medicine

## 2021-11-27 DIAGNOSIS — I10 Essential (primary) hypertension: Secondary | ICD-10-CM

## 2021-11-27 DIAGNOSIS — E785 Hyperlipidemia, unspecified: Secondary | ICD-10-CM

## 2021-11-29 ENCOUNTER — Other Ambulatory Visit: Payer: Self-pay

## 2021-11-29 ENCOUNTER — Telehealth: Payer: Self-pay | Admitting: Family Medicine

## 2021-11-29 DIAGNOSIS — E785 Hyperlipidemia, unspecified: Secondary | ICD-10-CM

## 2021-11-29 DIAGNOSIS — I10 Essential (primary) hypertension: Secondary | ICD-10-CM

## 2021-11-29 MED ORDER — SIMVASTATIN 20 MG PO TABS: 20.0000 mg | ORAL_TABLET | Freq: Every day | ORAL | 3 refills | Status: DC

## 2021-11-29 MED ORDER — TRIAMTERENE-HCTZ 37.5-25 MG PO TABS: 1.0000 | ORAL_TABLET | Freq: Every day | ORAL | 3 refills | Status: DC

## 2021-11-29 MED ORDER — METOPROLOL SUCCINATE ER 50 MG PO TB24: ORAL_TABLET | ORAL | 3 refills | Status: DC

## 2021-11-29 NOTE — Telephone Encounter (Signed)
Requesting refills of simvastatin (ZOCOR) 20 MG tablet, metoprolol succinate (TOPROL-XL) 50 MG 24 hr tablet , triamterene-hydrochlorothiazide (MAXZIDE-25) 37.5-25 MG tablet    [facility] [phone] - [place], [place] - [phone] AT [place] Phone:  [phone]  Fax:  [phone]

## 2021-11-29 NOTE — Telephone Encounter (Signed)
Refills sent to [facility]. 

## 2021-12-08 DIAGNOSIS — I8312 Varicose veins of left lower extremity with inflammation: Secondary | ICD-10-CM | POA: Diagnosis not present

## 2021-12-17 DIAGNOSIS — I8312 Varicose veins of left lower extremity with inflammation: Secondary | ICD-10-CM | POA: Diagnosis not present

## 2022-01-11 DIAGNOSIS — I8312 Varicose veins of left lower extremity with inflammation: Secondary | ICD-10-CM | POA: Diagnosis not present

## 2022-01-25 DIAGNOSIS — R58 Hemorrhage, not elsewhere classified: Secondary | ICD-10-CM | POA: Diagnosis not present

## 2022-02-09 DIAGNOSIS — I8312 Varicose veins of left lower extremity with inflammation: Secondary | ICD-10-CM | POA: Diagnosis not present

## 2022-02-09 DIAGNOSIS — M7981 Nontraumatic hematoma of soft tissue: Secondary | ICD-10-CM | POA: Diagnosis not present

## 2022-02-28 ENCOUNTER — Other Ambulatory Visit: Payer: Self-pay | Admitting: *Deleted

## 2022-02-28 DIAGNOSIS — D72819 Decreased white blood cell count, unspecified: Secondary | ICD-10-CM

## 2022-02-28 NOTE — Progress Notes (Incomplete)
Patient Care Team: Laurey Morale, MD as PCP - General (Family Medicine) Minus Breeding, MD as PCP - Cardiology (Cardiology)  DIAGNOSIS: No diagnosis found.  SUMMARY OF ONCOLOGIC HISTORY: Oncology History   No history exists.    CHIEF COMPLIANT: Follow-up of leukopenia    INTERVAL HISTORY: Jerry Crawford is a 61 y.o. with above-mentioned history of of leukopenia. Labs on 02/25/2021 showed WBC 3.4. He presents to the clinic today for follow-up.     ALLERGIES:  has No Known Allergies.  MEDICATIONS:  Current Outpatient Medications  Medication Sig Dispense Refill   azelastine (OPTIVAR) 0.05 % ophthalmic solution Place 1 drop into both eyes 2 (two) times daily. 6 mL 11   desonide (DESOWEN) 0.05 % lotion Apply topically 2 (two) times daily. 59 mL 11   loratadine (CLARITIN) 10 MG tablet Take 10 mg by mouth daily.     metoprolol succinate (TOPROL-XL) 50 MG 24 hr tablet TAKE 1 TABLET(50 MG) BY MOUTH DAILY WITH OR IMMEDIATELY FOLLOWING A MEAL 30 tablet 3   Multiple Vitamin (MULTIVITAMIN WITH MINERALS) TABS Take 1 tablet by mouth daily.     omeprazole (PRILOSEC) 40 MG capsule Take 1 capsule (40 mg total) by mouth daily. 30 capsule 11   simvastatin (ZOCOR) 20 MG tablet Take 1 tablet (20 mg total) by mouth daily. 30 tablet 3   tacrolimus (PROTOPIC) 0.1 % ointment APPLY TOPICALLY TO THE AFFECTED AREA TWICE DAILY 60 g 3   tadalafil (CIALIS) 20 MG tablet TAKE 1 TABLET(20 MG) BY MOUTH DAILY AS NEEDED FOR ERECTILE DYSFUNCTION 10 tablet 1   Tetrahydrozoline HCl (EYE DROPS OP) Apply 2 drops to eye as needed (for redness in eyes).     triamcinolone cream (KENALOG) 0.1 % Apply 1 application topically 2 (two) times daily. 45 g 2   triamterene-hydrochlorothiazide (MAXZIDE-25) 37.5-25 MG tablet Take 1 tablet by mouth daily. 30 tablet 3   Current Facility-Administered Medications  Medication Dose Route Frequency Provider Last Rate Last Admin   0.9 %  sodium chloride infusion  500 mL Intravenous  Once Ladene Artist, MD        PHYSICAL EXAMINATION: ECOG PERFORMANCE STATUS: {CHL ONC ECOG XI:[id number]}  There were no vitals filed for this visit. There were no vitals filed for this visit.  BREAST:*** No palpable masses or nodules in either right or left breasts. No palpable axillary supraclavicular or infraclavicular adenopathy no breast tenderness or nipple discharge. (exam performed in the presence of a chaperone)  LABORATORY DATA:  I have reviewed the data as listed    Latest Ref Rng & Units 07/08/2021   10:20 AM 02/25/2021    8:19 AM 06/26/2020    7:35 AM  CMP  Glucose 70 - 99 mg/dL 120  119  106   BUN 6 - 23 mg/dL '21  21  15   '$ Creatinine 0.40 - 1.50 mg/dL 1.03  1.07  0.96   Sodium 135 - 145 mEq/L 139  138  138   Potassium 3.5 - 5.1 mEq/L 4.3  3.8  4.1   Chloride 96 - 112 mEq/L 100  99  102   CO2 19 - 32 mEq/L '30  31  29   '$ Calcium 8.4 - 10.5 mg/dL 10.0  9.8  9.5   Total Protein 6.0 - 8.3 g/dL 7.1   6.5   Total Bilirubin 0.2 - 1.2 mg/dL 1.5   1.2   Alkaline Phos 39 - 117 U/L 51   49   AST 0 -  37 U/L 21   20   ALT 0 - 53 U/L 26   22     Lab Results  Component Value Date   WBC 3.5 (L) 07/08/2021   HGB 17.2 (H) 07/08/2021   HCT 51.1 07/08/2021   MCV 88.7 07/08/2021   PLT 200.0 07/08/2021   NEUTROABS 1.3 (L) 07/08/2021    ASSESSMENT & PLAN:  No problem-specific Assessment & Plan notes found for this encounter.    No orders of the defined types were placed in this encounter.  The patient has a good understanding of the overall plan. he agrees with it. he will call with any problems that may develop before the next visit here. Total time spent: 30 mins including face to face time and time spent for planning, charting and co-ordination of care   Suzzette Righter, [place] 02/28/22    I Gardiner Coins am scribing for Dr. Lindi Adie  ***

## 2022-03-01 ENCOUNTER — Inpatient Hospital Stay: Payer: BC Managed Care – PPO | Attending: Family Medicine

## 2022-03-01 ENCOUNTER — Inpatient Hospital Stay: Payer: BC Managed Care – PPO | Admitting: Hematology and Oncology

## 2022-03-01 NOTE — Assessment & Plan Note (Deleted)
Lab review: 03/30/2012: WBC 4.8, ANC 2.5 ALC 2 06/15/2016: WBC 3.8 04/21/2017: WBC 3.8, ANC 1.4, ALC 1.8 06/05/2018: WBC 3.3 ANC 1.4, ALC 1.4 05/08/20: WBC 3.3, ANC 1.4, B 12: 234, Folate 23, ANA Neg 06/26/20: WBC 3, ANC 1.3 08/14/2020: WBC 3, ANC 1 02/25/2021: WBC 3.4, ANC 1.2 02/26/2021: WBC 4.1, ANC 1.5 03/01/2022:   Bone Marrow Biopsy: 08/20/20: Normocellular bone marrow 40 to 60% cellularity with trilineage hematopoiesis.  No dysplastic cells.    Cytogenetics and FISH: Normal   Since the labs look relatively stable, we can recheck his labs in 1 year

## 2022-03-25 NOTE — Progress Notes (Signed)
Patient Care Team: Laurey Morale, MD as PCP - General (Family Medicine) Minus Breeding, MD as PCP - Cardiology (Cardiology)  DIAGNOSIS: No diagnosis found.  SUMMARY OF ONCOLOGIC HISTORY: Oncology History   No history exists.    CHIEF COMPLIANT:   INTERVAL HISTORY: Jerry Crawford is a   ALLERGIES:  has No Known Allergies.  MEDICATIONS:  Current Outpatient Medications  Medication Sig Dispense Refill   azelastine (OPTIVAR) 0.05 % ophthalmic solution Place 1 drop into both eyes 2 (two) times daily. 6 mL 11   desonide (DESOWEN) 0.05 % lotion Apply topically 2 (two) times daily. 59 mL 11   loratadine (CLARITIN) 10 MG tablet Take 10 mg by mouth daily.     metoprolol succinate (TOPROL-XL) 50 MG 24 hr tablet TAKE 1 TABLET(50 MG) BY MOUTH DAILY WITH OR IMMEDIATELY FOLLOWING A MEAL 30 tablet 3   Multiple Vitamin (MULTIVITAMIN WITH MINERALS) TABS Take 1 tablet by mouth daily.     omeprazole (PRILOSEC) 40 MG capsule Take 1 capsule (40 mg total) by mouth daily. 30 capsule 11   simvastatin (ZOCOR) 20 MG tablet Take 1 tablet (20 mg total) by mouth daily. 30 tablet 3   tacrolimus (PROTOPIC) 0.1 % ointment APPLY TOPICALLY TO THE AFFECTED AREA TWICE DAILY 60 g 3   tadalafil (CIALIS) 20 MG tablet TAKE 1 TABLET(20 MG) BY MOUTH DAILY AS NEEDED FOR ERECTILE DYSFUNCTION 10 tablet 1   Tetrahydrozoline HCl (EYE DROPS OP) Apply 2 drops to eye as needed (for redness in eyes).     triamcinolone cream (KENALOG) 0.1 % Apply 1 application topically 2 (two) times daily. 45 g 2   triamterene-hydrochlorothiazide (MAXZIDE-25) 37.5-25 MG tablet Take 1 tablet by mouth daily. 30 tablet 3   Current Facility-Administered Medications  Medication Dose Route Frequency Provider Last Rate Last Admin   0.9 %  sodium chloride infusion  500 mL Intravenous Once Ladene Artist, MD        PHYSICAL EXAMINATION: ECOG PERFORMANCE STATUS: {CHL ONC ECOG GY:[id number]}  There were no vitals filed for this visit. There  were no vitals filed for this visit.  BREAST:*** No palpable masses or nodules in either right or left breasts. No palpable axillary supraclavicular or infraclavicular adenopathy no breast tenderness or nipple discharge. (exam performed in the presence of a chaperone)  LABORATORY DATA:  I have reviewed the data as listed    Latest Ref Rng & Units 07/08/2021   10:20 AM 02/25/2021    8:19 AM 06/26/2020    7:35 AM  CMP  Glucose 70 - 99 mg/dL 120  119  106   BUN 6 - 23 mg/dL '21  21  15   '$ Creatinine 0.40 - 1.50 mg/dL 1.03  1.07  0.96   Sodium 135 - 145 mEq/L 139  138  138   Potassium 3.5 - 5.1 mEq/L 4.3  3.8  4.1   Chloride 96 - 112 mEq/L 100  99  102   CO2 19 - 32 mEq/L '30  31  29   '$ Calcium 8.4 - 10.5 mg/dL 10.0  9.8  9.5   Total Protein 6.0 - 8.3 g/dL 7.1   6.5   Total Bilirubin 0.2 - 1.2 mg/dL 1.5   1.2   Alkaline Phos 39 - 117 U/L 51   49   AST 0 - 37 U/L 21   20   ALT 0 - 53 U/L 26   22     Lab Results  Component Value Date  WBC 3.5 (L) 07/08/2021   HGB 17.2 (H) 07/08/2021   HCT 51.1 07/08/2021   MCV 88.7 07/08/2021   PLT 200.0 07/08/2021   NEUTROABS 1.3 (L) 07/08/2021    ASSESSMENT & PLAN:  No problem-specific Assessment & Plan notes found for this encounter.    No orders of the defined types were placed in this encounter.  The patient has a good understanding of the overall plan. he agrees with it. he will call with any problems that may develop before the next visit here. Total time spent: 30 mins including face to face time and time spent for planning, charting and co-ordination of care   Suzzette Righter, [place] 03/25/22    I Gardiner Coins am acting as a Education administrator for [employer]  ***

## 2022-03-28 ENCOUNTER — Inpatient Hospital Stay: Payer: BC Managed Care – PPO | Admitting: Hematology and Oncology

## 2022-03-28 ENCOUNTER — Other Ambulatory Visit: Payer: Self-pay

## 2022-03-28 ENCOUNTER — Inpatient Hospital Stay: Payer: BC Managed Care – PPO | Attending: Family Medicine

## 2022-03-28 VITALS — BP 140/83 | HR 77 | Temp 97.2°F | Resp 18 | Ht 71.0 in | Wt 220.4 lb

## 2022-03-28 DIAGNOSIS — D709 Neutropenia, unspecified: Secondary | ICD-10-CM | POA: Insufficient documentation

## 2022-03-28 DIAGNOSIS — D72819 Decreased white blood cell count, unspecified: Secondary | ICD-10-CM

## 2022-03-28 LAB — CBC WITH DIFFERENTIAL (CANCER CENTER ONLY)
Abs Immature Granulocytes: 0.02 10*3/uL (ref 0.00–0.07)
Basophils Absolute: 0 10*3/uL (ref 0.0–0.1)
Basophils Relative: 1 %
Eosinophils Absolute: 0.1 10*3/uL (ref 0.0–0.5)
Eosinophils Relative: 3 %
HCT: 48.1 % (ref 39.0–52.0)
Hemoglobin: 16.4 g/dL (ref 13.0–17.0)
Immature Granulocytes: 1 %
Lymphocytes Relative: 38 %
Lymphs Abs: 1.5 10*3/uL (ref 0.7–4.0)
MCH: 29.9 pg (ref 26.0–34.0)
MCHC: 34.1 g/dL (ref 30.0–36.0)
MCV: 87.6 fL (ref 80.0–100.0)
Monocytes Absolute: 0.5 10*3/uL (ref 0.1–1.0)
Monocytes Relative: 12 %
Neutro Abs: 1.8 10*3/uL (ref 1.7–7.7)
Neutrophils Relative %: 45 %
Platelet Count: 190 10*3/uL (ref 150–400)
RBC: 5.49 MIL/uL (ref 4.22–5.81)
RDW: 12.5 % (ref 11.5–15.5)
WBC Count: 4 10*3/uL (ref 4.0–10.5)
nRBC: 0 % (ref 0.0–0.2)

## 2022-03-28 NOTE — Assessment & Plan Note (Signed)
Lab review: 03/30/2012: WBC 4.8, ANC 2.5 ALC 2 06/15/2016: WBC 3.8 04/21/2017: WBC 3.8, ANC 1.4, ALC 1.8 06/05/2018: WBC 3.3 ANC 1.4, ALC 1.4 05/08/20: WBC 3.3, ANC 1.4, B 12: 234, Folate 23, ANA Neg 06/26/20: WBC 3, ANC 1.3 08/14/2020: WBC 3, ANC 1 02/25/2021: WBC 3.4, ANC 1.2 02/26/2021: WBC 4.1, ANC 1.5 07/08/2021: WBC 3.5, hemoglobin 17.2, ANC 1.3   Bone Marrow Biopsy: 08/20/20: Normocellular bone marrow 40 to 60% cellularity with trilineage hematopoiesis.  No dysplastic cells.    Cytogenetics and FISH: Normal  Elevated hemoglobin: I discussed with him about different causes of elevated hemoglobin.   Since the labs look relatively stable, we can recheck his labs in 1 year

## 2022-04-11 HISTORY — PX: ENDOVENOUS ABLATION SAPHENOUS VEIN W/ LASER: SUR449

## 2022-04-15 ENCOUNTER — Ambulatory Visit: Payer: BC Managed Care – PPO | Admitting: Internal Medicine

## 2022-04-15 VITALS — BP 134/84 | HR 58 | Temp 98.0°F | Ht 71.0 in | Wt 214.0 lb

## 2022-04-15 DIAGNOSIS — I10 Essential (primary) hypertension: Secondary | ICD-10-CM

## 2022-04-15 DIAGNOSIS — E1169 Type 2 diabetes mellitus with other specified complication: Secondary | ICD-10-CM | POA: Diagnosis not present

## 2022-04-15 DIAGNOSIS — G4483 Primary cough headache: Secondary | ICD-10-CM | POA: Diagnosis not present

## 2022-04-15 DIAGNOSIS — J101 Influenza due to other identified influenza virus with other respiratory manifestations: Secondary | ICD-10-CM | POA: Diagnosis not present

## 2022-04-15 LAB — POC SOFIA SARS ANTIGEN FIA: SARS Coronavirus 2 Ag: NEGATIVE

## 2022-04-15 LAB — POC INFLUENZA A&B (BINAX/QUICKVUE)
Influenza A, POC: POSITIVE — AB
Influenza B, POC: NEGATIVE

## 2022-04-15 LAB — POCT RESPIRATORY SYNCYTIAL VIRUS: RSV Rapid Ag: NEGATIVE

## 2022-04-15 MED ORDER — HYDROCODONE BIT-HOMATROP MBR 5-1.5 MG/5ML PO SOLN: 5.0000 mL | Freq: Four times a day (QID) | ORAL | 0 refills | Status: AC | PRN

## 2022-04-15 MED ORDER — OSELTAMIVIR PHOSPHATE 75 MG PO CAPS: 75.0000 mg | ORAL_CAPSULE | Freq: Two times a day (BID) | ORAL | 0 refills | Status: DC

## 2022-04-15 MED ORDER — ONDANSETRON 4 MG PO TBDP: 4.0000 mg | ORAL_TABLET | Freq: Three times a day (TID) | ORAL | 0 refills | Status: AC | PRN

## 2022-04-15 NOTE — Patient Instructions (Signed)
Please take all new medication as prescribed - the tamilfu antibiotic, nausea mediction and cough medicine as needed  Please continue all other medications as before, and refills have been done if requested.  Please have the pharmacy call with any other refills you may need.  Please keep your appointments with your specialists as you may have planned

## 2022-04-15 NOTE — Progress Notes (Unsigned)
Patient ID: Jerry Crawford, male   DOB: 01-Apr-1961, 62 y.o.   MRN: [id number]        Chief Complaint: follow up URI symptoms with Flu A, dm, htn       HPI:  Jerry Crawford is a 62 y.o. male here with c/o URI symptoms -  Here with 2-3 days acute onset fever, facial pain, pressure, headache, general weakness and malaise, and greenish d/c, with mild ST and cough, but pt denies chest pain, wheezing, increased sob or doe, orthopnea, PND, increased LE swelling, palpitations, dizziness or syncope.   Pt denies polydipsia, polyuria, or new focal neuro s/s.    Pt denies other recent wt loss, night sweats, loss of appetite, or other constitutional symptoms   Covid neg at home yesterday.  Tylenol and delsym otc help with fever and cough.         Wt Readings from Last 3 Encounters:  04/15/22 214 lb (97.1 kg)  03/28/22 220 lb 6.4 oz (100 kg)  07/08/21 217 lb (98.4 kg)   BP Readings from Last 3 Encounters:  04/15/22 134/84  03/28/22 (!) 140/83  07/08/21 120/86         Past Medical History:  Diagnosis Date   Allergy    Hypercholesteremia    Hyperlipidemia    Hypertension    Past Surgical History:  Procedure Laterality Date   COLONOSCOPY  06/02/2017   per Dr. Fuller Plan, adenomatous polyp, repeat in 5 yrs    ENDOVENOUS ABLATION SAPHENOUS VEIN W/ LASER Left 11/08/2016   endovenous laser ablation left small saphenous vein by Tinnie Gens MD    ENDOVENOUS [place] W/ LASER Left 11/23/2016   endovenous laser ablation left greater saphenous vein and stab phlebectomy > 20 incisions left leg by Tinnie Gens MD   ENDOVENOUS ABLATION SAPHENOUS VEIN W/ LASER Right 12/26/2016   endovenous laser ablation R greater saphenous vein and stab phlebectomy > 20 incisions R leg by Tinnie Gens MD     reports that he has never smoked. He has never used smokeless tobacco. He reports current alcohol use. He reports that he does not use drugs. family history includes Asthma in his brother, mother, and  sister; Healthy in his father; Hypertension in his mother and sister. No Known Allergies Current Outpatient Medications on File Prior to Visit  Medication Sig Dispense Refill   azelastine (OPTIVAR) 0.05 % ophthalmic solution Place 1 drop into both eyes 2 (two) times daily. 6 mL 11   desonide (DESOWEN) 0.05 % lotion Apply topically 2 (two) times daily. 59 mL 11   loratadine (CLARITIN) 10 MG tablet Take 10 mg by mouth daily.     metoprolol succinate (TOPROL-XL) 50 MG 24 hr tablet TAKE 1 TABLET(50 MG) BY MOUTH DAILY WITH OR IMMEDIATELY FOLLOWING A MEAL 30 tablet 3   Multiple Vitamin (MULTIVITAMIN WITH MINERALS) TABS Take 1 tablet by mouth daily.     omeprazole (PRILOSEC) 40 MG capsule Take 1 capsule (40 mg total) by mouth daily. 30 capsule 11   simvastatin (ZOCOR) 20 MG tablet Take 1 tablet (20 mg total) by mouth daily. 30 tablet 3   tacrolimus (PROTOPIC) 0.1 % ointment APPLY TOPICALLY TO THE AFFECTED AREA TWICE DAILY 60 g 3   tadalafil (CIALIS) 20 MG tablet TAKE 1 TABLET(20 MG) BY MOUTH DAILY AS NEEDED FOR ERECTILE DYSFUNCTION 10 tablet 1   Tetrahydrozoline HCl (EYE DROPS OP) Apply 2 drops to eye as needed (for redness in eyes).     triamcinolone  cream (KENALOG) 0.1 % Apply 1 application topically 2 (two) times daily. 45 g 2   triamterene-hydrochlorothiazide (MAXZIDE-25) 37.5-25 MG tablet Take 1 tablet by mouth daily. 30 tablet 3   Current Facility-Administered Medications on File Prior to Visit  Medication Dose Route Frequency Provider Last Rate Last Admin   0.9 %  sodium chloride infusion  500 mL Intravenous Once Ladene Artist, MD            ROS:  All others reviewed and negative.  Objective        PE:  BP 134/84 (BP Location: Left Arm, Patient Position: Sitting, Cuff Size: Large)   Pulse (!) 58   Temp 98 F (36.7 C) (Oral)   Ht '5\' 11"'$  (1.803 m)   Wt 214 lb (97.1 kg)   SpO2 90%   BMI 29.85 kg/m                 Constitutional: Pt appears in NAD, mild ill               HENT:  Head: NCAT.                Right Ear: External ear normal.                 Left Ear: External ear normal.  Bilat tm's with mild erythema.  Max sinus areas mild tender.  Pharynx with mild erythema, no exudate               Eyes: . Pupils are equal, round, and reactive to light. Conjunctivae and EOM are normal               Nose: without d/c or deformity               Neck: Neck supple. Gross normal ROM               Cardiovascular: Normal rate and regular rhythm.                 Pulmonary/Chest: Effort normal and breath sounds without rales or wheezing.                Abd:  Soft, NT, ND, + BS, no organomegaly               Neurological: Pt is alert. At baseline orientation, motor grossly intact               Skin: Skin is warm. No rashes, no other new lesions, LE edema - none               Psychiatric: Pt behavior is normal without agitation   Micro: none  Cardiac tracings I have personally interpreted today:  none  Pertinent Radiological findings (summarize): none   Lab Results  Component Value Date   WBC 4.0 03/28/2022   HGB 16.4 03/28/2022   HCT 48.1 03/28/2022   PLT 190 03/28/2022   GLUCOSE 120 (H) 07/08/2021   CHOL 116 07/08/2021   TRIG 138.0 07/08/2021   HDL 39.20 07/08/2021   LDLCALC 49 07/08/2021   ALT 26 07/08/2021   AST 21 07/08/2021   NA 139 07/08/2021   K 4.3 07/08/2021   CL 100 07/08/2021   CREATININE 1.03 07/08/2021   BUN 21 07/08/2021   CO2 30 07/08/2021   TSH 1.77 07/08/2021   PSA 0.41 07/08/2021   HGBA1C 6.8 (H) 07/08/2021   POCT - influenza A - Positive  Assessment/Plan:  Jerry C  Crawford is a 62 y.o. Black or African American [2] Unavailable [8] male with  has a past medical history of Allergy, Hypercholesteremia, Hyperlipidemia, and Hypertension.  Influenza A Acute onset, for tamiflu course asd, and zofran prn, immodium if needed,  to f/u any worsening symptoms or concerns   Hypertension BP Readings from Last 3 Encounters:  04/15/22 134/84   03/28/22 (!) 140/83  07/08/21 120/86   Stable, pt to continue medical treatment toprol xl 50 qd,    T2DM (type 2 diabetes mellitus) ([place]) Lab Results  Component Value Date   HGBA1C 6.8 (H) 07/08/2021   Stable, pt to continue current medical treatment - diet, wt control, declines lab today  Followup: Return if symptoms worsen or fail to improve.  Cathlean Cower, MD 04/16/2022 5:41 PM [facility]

## 2022-04-16 ENCOUNTER — Encounter: Payer: Self-pay | Admitting: Internal Medicine

## 2022-04-16 NOTE — Assessment & Plan Note (Signed)
Acute onset, for tamiflu course asd, and zofran prn, immodium if needed,  to f/u any worsening symptoms or concerns

## 2022-04-16 NOTE — Assessment & Plan Note (Signed)
Lab Results  Component Value Date   HGBA1C 6.8 (H) 07/08/2021   Stable, pt to continue current medical treatment - diet, wt control, declines lab today

## 2022-04-16 NOTE — Assessment & Plan Note (Signed)
BP Readings from Last 3 Encounters:  04/15/22 134/84  03/28/22 (!) 140/83  07/08/21 120/86   Stable, pt to continue medical treatment toprol xl 50 qd,

## 2022-04-23 ENCOUNTER — Other Ambulatory Visit: Payer: Self-pay | Admitting: Family Medicine

## 2022-04-25 ENCOUNTER — Other Ambulatory Visit: Payer: Self-pay | Admitting: Family Medicine

## 2022-04-25 DIAGNOSIS — I10 Essential (primary) hypertension: Secondary | ICD-10-CM

## 2022-04-25 DIAGNOSIS — E785 Hyperlipidemia, unspecified: Secondary | ICD-10-CM

## 2022-05-23 ENCOUNTER — Other Ambulatory Visit: Payer: Self-pay | Admitting: Family Medicine

## 2022-06-23 ENCOUNTER — Other Ambulatory Visit: Payer: Self-pay | Admitting: Family Medicine

## 2022-06-23 DIAGNOSIS — J302 Other seasonal allergic rhinitis: Secondary | ICD-10-CM

## 2022-06-25 ENCOUNTER — Other Ambulatory Visit: Payer: Self-pay | Admitting: Family Medicine

## 2022-07-24 ENCOUNTER — Other Ambulatory Visit: Payer: Self-pay | Admitting: Family Medicine

## 2022-07-24 DIAGNOSIS — E785 Hyperlipidemia, unspecified: Secondary | ICD-10-CM

## 2022-07-24 DIAGNOSIS — I10 Essential (primary) hypertension: Secondary | ICD-10-CM

## 2022-08-21 ENCOUNTER — Other Ambulatory Visit: Payer: Self-pay | Admitting: Family Medicine

## 2022-09-15 ENCOUNTER — Encounter: Payer: Self-pay | Admitting: Family Medicine

## 2022-09-15 ENCOUNTER — Ambulatory Visit (INDEPENDENT_AMBULATORY_CARE_PROVIDER_SITE_OTHER): Payer: BC Managed Care – PPO | Admitting: Family Medicine

## 2022-09-15 VITALS — BP 120/78 | HR 65 | Temp 98.1°F | Ht 71.0 in | Wt 218.0 lb

## 2022-09-15 DIAGNOSIS — Z0000 Encounter for general adult medical examination without abnormal findings: Secondary | ICD-10-CM

## 2022-09-15 DIAGNOSIS — Z86010 Personal history of colonic polyps: Secondary | ICD-10-CM

## 2022-09-15 DIAGNOSIS — L80 Vitiligo: Secondary | ICD-10-CM | POA: Diagnosis not present

## 2022-09-15 DIAGNOSIS — I10 Essential (primary) hypertension: Secondary | ICD-10-CM | POA: Diagnosis not present

## 2022-09-15 LAB — HEMOGLOBIN A1C: Hgb A1c MFr Bld: 6.9 % — ABNORMAL HIGH (ref 4.6–6.5)

## 2022-09-15 LAB — LIPID PANEL
Cholesterol: 121 mg/dL (ref 0–200)
HDL: 40.4 mg/dL (ref >39.00)
LDL Cholesterol: 57 mg/dL (ref 0–99)
NonHDL: 80.61
Total CHOL/HDL Ratio: 3
Triglycerides: 119 mg/dL (ref 0.0–149.0)
VLDL: 23.8 mg/dL (ref 0.0–40.0)

## 2022-09-15 LAB — HEPATIC FUNCTION PANEL
ALT: 25 U/L (ref 0–53)
AST: 23 U/L (ref 0–37)
Albumin: 4.6 g/dL (ref 3.5–5.2)
Alkaline Phosphatase: 57 U/L (ref 39–117)
Bilirubin, Direct: 0.3 mg/dL (ref 0.0–0.3)
Total Bilirubin: 1.6 mg/dL — ABNORMAL HIGH (ref 0.2–1.2)
Total Protein: 7.2 g/dL (ref 6.0–8.3)

## 2022-09-15 LAB — CBC WITH DIFFERENTIAL/PLATELET
Basophils Absolute: 0 10*3/uL (ref 0.0–0.1)
Basophils Relative: 0.4 % (ref 0.0–3.0)
Eosinophils Absolute: 0.1 10*3/uL (ref 0.0–0.7)
Eosinophils Relative: 3.6 % (ref 0.0–5.0)
HCT: 50 % (ref 39.0–52.0)
Hemoglobin: 16.7 g/dL (ref 13.0–17.0)
Lymphocytes Relative: 44.1 % (ref 12.0–46.0)
Lymphs Abs: 1.8 10*3/uL (ref 0.7–4.0)
MCHC: 33.3 g/dL (ref 30.0–36.0)
MCV: 88.2 fl (ref 78.0–100.0)
Monocytes Absolute: 0.5 10*3/uL (ref 0.1–1.0)
Monocytes Relative: 11.5 % (ref 3.0–12.0)
Neutro Abs: 1.6 10*3/uL (ref 1.4–7.7)
Neutrophils Relative %: 40.4 % — ABNORMAL LOW (ref 43.0–77.0)
Platelets: 226 10*3/uL (ref 150.0–400.0)
RBC: 5.67 Mil/uL (ref 4.22–5.81)
RDW: 13.2 % (ref 11.5–15.5)
WBC: 4 10*3/uL (ref 4.0–10.5)

## 2022-09-15 LAB — PSA: PSA: 0.51 ng/mL (ref 0.10–4.00)

## 2022-09-15 LAB — BASIC METABOLIC PANEL
BUN: 16 mg/dL (ref 6–23)
CO2: 30 mEq/L (ref 19–32)
Calcium: 9.8 mg/dL (ref 8.4–10.5)
Chloride: 98 mEq/L (ref 96–112)
Creatinine, Ser: 0.99 mg/dL (ref 0.40–1.50)
GFR: 83.17 mL/min (ref >60.00)
Glucose, Bld: 126 mg/dL — ABNORMAL HIGH (ref 70–99)
Potassium: 4.4 mEq/L (ref 3.5–5.1)
Sodium: 137 mEq/L (ref 135–145)

## 2022-09-15 LAB — TSH: TSH: 1.36 u[IU]/mL (ref 0.35–5.50)

## 2022-09-15 MED ORDER — TRIAMTERENE-HCTZ 37.5-25 MG PO TABS: 1.0000 | ORAL_TABLET | Freq: Every day | ORAL | 3 refills | Status: DC

## 2022-09-15 NOTE — Progress Notes (Signed)
Subjective:    Patient ID: Jerry Crawford, male    DOB: 12/20/1960, 62 y.o.   MRN: [id number]  HPI Here for a well exam. He feels great. He had another endovenous ablation procedure on the right leg last week. He also asks about the skin on his hands becoming lighter over the past few months. He has never seen this before.    Review of Systems  Constitutional: Negative.   HENT: Negative.    Eyes: Negative.   Respiratory: Negative.    Cardiovascular: Negative.   Gastrointestinal: Negative.   Genitourinary: Negative.   Musculoskeletal: Negative.   Skin: Negative.   Neurological: Negative.   Psychiatric/Behavioral: Negative.         Objective:   Physical Exam Constitutional:      General: He is not in acute distress.    Appearance: Normal appearance. He is well-developed. He is not diaphoretic.  HENT:     Head: Normocephalic and atraumatic.     Right Ear: External ear normal.     Left Ear: External ear normal.     Nose: Nose normal.     Mouth/Throat:     Pharynx: No oropharyngeal exudate.  Eyes:     General: No scleral icterus.       Right eye: No discharge.        Left eye: No discharge.     Conjunctiva/sclera: Conjunctivae normal.     Pupils: Pupils are equal, round, and reactive to light.  Neck:     Thyroid: No thyromegaly.     Vascular: No JVD.     Trachea: No tracheal deviation.  Cardiovascular:     Rate and Rhythm: Normal rate and regular rhythm.     Heart sounds: Normal heart sounds. No murmur heard.    No friction rub. No gallop.  Pulmonary:     Effort: Pulmonary effort is normal. No respiratory distress.     Breath sounds: Normal breath sounds. No wheezing or rales.  Chest:     Chest wall: No tenderness.  Abdominal:     General: Bowel sounds are normal. There is no distension.     Palpations: Abdomen is soft. There is no mass.     Tenderness: There is no abdominal tenderness. There is no guarding or rebound.  Genitourinary:    Penis: Normal. No  tenderness.      Testes: Normal.     Prostate: Normal.     Rectum: Normal. Guaiac result negative.  Musculoskeletal:        General: No tenderness. Normal range of motion.     Cervical back: Neck supple.  Lymphadenopathy:     Cervical: No cervical adenopathy.  Skin:    General: Skin is warm and dry.     Findings: No erythema or rash.     Comments: Both hand ands all fingers have small areas of depigmentation   Neurological:     Mental Status: He is alert and oriented to person, place, and time.     Cranial Nerves: No cranial nerve deficit.     Motor: No abnormal muscle tone.     Coordination: Coordination normal.     Deep Tendon Reflexes: Reflexes are normal and symmetric. Reflexes normal.  Psychiatric:        Behavior: Behavior normal.        Thought Content: Thought content normal.        Judgment: Judgment normal.           Assessment & Plan:  Well exam. We discussed diet and exercise. Get fasting labs. Set up another colonoscopy. Refer to Dermatology for a new diagnosis of vitiligo.  Gershon Crane, MD

## 2022-09-22 ENCOUNTER — Encounter: Payer: Self-pay | Admitting: Family Medicine

## 2022-09-22 ENCOUNTER — Ambulatory Visit: Payer: BC Managed Care – PPO | Admitting: Family Medicine

## 2022-09-22 VITALS — BP 128/80 | HR 71 | Temp 97.6°F | Wt 216.0 lb

## 2022-09-22 DIAGNOSIS — I10 Essential (primary) hypertension: Secondary | ICD-10-CM

## 2022-09-22 MED ORDER — METOPROLOL SUCCINATE ER 100 MG PO TB24
100.0000 mg | ORAL_TABLET | Freq: Every day | ORAL | 3 refills | Status: DC
Start: 2022-09-22 — End: 2023-01-26

## 2022-09-22 NOTE — Progress Notes (Signed)
   Subjective:    Patient ID: Jerry Crawford, male    DOB: [birth year], 62 y.o.   MRN: [id number]  HPI Here for elevated BP readings for the past week. He had not been checking these, but several days ago he felt lightheaded at work, and his BP was 160/100. His boss sent him home, and the pressure has remained high. Today he feels fine.    Review of Systems  Constitutional: Negative.   Respiratory: Negative.    Cardiovascular: Negative.   Neurological:  Positive for light-headedness. Negative for headaches.       Objective:   Physical Exam Constitutional:      Appearance: Normal appearance. He is not ill-appearing.  Cardiovascular:     Rate and Rhythm: Normal rate and regular rhythm.     Pulses: Normal pulses.     Heart sounds: Normal heart sounds.  Pulmonary:     Effort: Pulmonary effort is normal.     Breath sounds: Normal breath sounds.  Musculoskeletal:     Right lower leg: No edema.     Left lower leg: No edema.  Neurological:     Mental Status: He is alert.           Assessment & Plan:  HTN. We will increase the Metoprolol succinate to 100 mg daily. Recheck in one week.  Gershon Crane, MD

## 2022-09-23 ENCOUNTER — Encounter: Payer: Self-pay | Admitting: Gastroenterology

## 2022-10-22 ENCOUNTER — Other Ambulatory Visit: Payer: Self-pay | Admitting: Family Medicine

## 2022-10-22 DIAGNOSIS — E785 Hyperlipidemia, unspecified: Secondary | ICD-10-CM

## 2022-11-01 ENCOUNTER — Other Ambulatory Visit: Payer: Self-pay | Admitting: Family Medicine

## 2022-11-01 DIAGNOSIS — I10 Essential (primary) hypertension: Secondary | ICD-10-CM

## 2022-11-11 ENCOUNTER — Encounter: Payer: Self-pay | Admitting: Gastroenterology

## 2022-11-11 ENCOUNTER — Ambulatory Visit (AMBULATORY_SURGERY_CENTER): Payer: BC Managed Care – PPO | Admitting: *Deleted

## 2022-11-11 VITALS — Ht 71.0 in | Wt 215.0 lb

## 2022-11-11 DIAGNOSIS — Z86010 Personal history of colonic polyps: Secondary | ICD-10-CM

## 2022-11-11 MED ORDER — NA SULFATE-K SULFATE-MG SULF 17.5-3.13-1.6 GM/177ML PO SOLN
1.0000 | Freq: Once | ORAL | 0 refills | Status: AC
Start: 2022-11-11 — End: 2022-11-11

## 2022-11-11 NOTE — Progress Notes (Signed)
Pt's name and DOB verified at the beginning of the pre-visit.  Pt denies any difficulty with ambulating,sitting, laying down or rolling side to side Gave both [facility] main # and MD on call # prior to instructions.  No egg or soy allergy known to patient  No issues known to pt with past sedation with any surgeries or procedures Patient denies ever being intubated Pt has no issues moving head neck or swallowing No FH of Malignant Hyperthermia Pt is not on diet pills Pt is not on home 02  Pt is not on blood thinners  Pt denies issues with constipation  Pt is not on dialysis Pt denise any abnormal heart rhythms  Pt denies any upcoming cardiac testing Pt encouraged to use to use Singlecare or Goodrx to reduce cost  Patient's chart reviewed by Cathlyn Parsons CNRA prior to pre-visit and patient appropriate for the [facility].  Pre-visit completed and red dot placed by patient's name on their procedure day (on provider's schedule).  . Visit by phone Pt states weight is 215 lb Instructed pt why it is important to and  to call if they have any changes in health or new medications. Directed them to the # given and on instructions.   Pt states they will.  Instructions reviewed with pt and pt states understanding. Instructed to review again prior to procedure. Pt states they will.  Instructions sent by mail with coupon and by my chart

## 2022-11-28 ENCOUNTER — Encounter: Payer: Self-pay | Admitting: Gastroenterology

## 2022-11-28 ENCOUNTER — Telehealth: Payer: Self-pay | Admitting: Gastroenterology

## 2022-11-28 ENCOUNTER — Ambulatory Visit (AMBULATORY_SURGERY_CENTER): Payer: BC Managed Care – PPO | Admitting: Gastroenterology

## 2022-11-28 VITALS — BP 118/74 | HR 80 | Temp 97.5°F | Resp 25 | Ht 71.0 in | Wt 215.0 lb

## 2022-11-28 DIAGNOSIS — Z09 Encounter for follow-up examination after completed treatment for conditions other than malignant neoplasm: Secondary | ICD-10-CM | POA: Diagnosis not present

## 2022-11-28 DIAGNOSIS — Z86010 Personal history of colonic polyps: Secondary | ICD-10-CM

## 2022-11-28 DIAGNOSIS — Z1211 Encounter for screening for malignant neoplasm of colon: Secondary | ICD-10-CM | POA: Diagnosis not present

## 2022-11-28 HISTORY — PX: COLONOSCOPY: SHX174

## 2022-11-28 MED ORDER — SODIUM CHLORIDE 0.9 % IV SOLN: 500.0000 mL | Freq: Once | INTRAVENOUS | Status: DC

## 2022-11-28 NOTE — Progress Notes (Signed)
History & Physical  Primary Care Physician:  Nelwyn Salisbury, MD Primary Gastroenterologist: Claudette Head, MD  Impression / Plan:  Personal history of adenomatous colon polyps for surveillance colonoscopy.  CHIEF COMPLAINT:  Personal history of colon polyps   HPI: Jerry Crawford is a 62 y.o. male with a personal history of adenomatous colon polyps for surveillance colonoscopy.    Past Medical History:  Diagnosis Date   Allergy    Hypercholesteremia    Hyperlipidemia    Hypertension     Past Surgical History:  Procedure Laterality Date   COLONOSCOPY  06/02/2017   per Dr. Russella Dar, adenomatous polyp, repeat in 5 yrs    ENDOVENOUS ABLATION SAPHENOUS VEIN W/ LASER Left 11/08/2016   endovenous laser ablation left small saphenous vein by Josephina Gip MD    ENDOVENOUS ABLATION SAPHENOUS VEIN W/ LASER Left 11/23/2016   endovenous laser ablation left greater saphenous vein and stab phlebectomy > 20 incisions left leg by Josephina Gip MD   ENDOVENOUS ABLATION SAPHENOUS VEIN W/ LASER Right 12/26/2016   endovenous laser ablation R greater saphenous vein and stab phlebectomy > 20 incisions R leg by Josephina Gip MD    ENDOVENOUS ABLATION SAPHENOUS VEIN W/ LASER Right 2024   per [facility]    Prior to Admission medications   Medication Sig Start Date End Date Taking? Authorizing Provider  metoprolol succinate (TOPROL-XL) 100 MG 24 hr tablet Take 1 tablet (100 mg total) by mouth daily. Take with or immediately following a meal. 09/22/22  Yes Nelwyn Salisbury, MD  simvastatin (ZOCOR) 20 MG tablet TAKE 1 TABLET BY MOUTH EVERY DAY IN THE EVENING 10/24/22  Yes Nelwyn Salisbury, MD  triamcinolone cream (KENALOG) 0.1 % Apply 1 application topically 2 (two) times daily. 06/08/18  Yes Nelwyn Salisbury, MD  triamterene-hydrochlorothiazide (MAXZIDE-25) 37.5-25 MG tablet TAKE 1 TABLET BY MOUTH DAILY 11/01/22  Yes Nelwyn Salisbury, MD  azelastine (OPTIVAR) 0.05 % ophthalmic solution INSTILL 1 DROP IN  BOTH EYES TWICE DAILY 06/23/22   Nelwyn Salisbury, MD  desonide (DESOWEN) 0.05 % lotion Apply topically 2 (two) times daily. Patient not taking: Reported on 11/11/2022 03/13/17   Nelwyn Salisbury, MD  Multiple Vitamin (MULTIVITAMIN WITH MINERALS) TABS Take 1 tablet by mouth daily.    [provider]  ondansetron (ZOFRAN-ODT) 4 MG disintegrating tablet Take 1 tablet (4 mg total) by mouth every 8 (eight) hours as needed for nausea or vomiting. Patient not taking: Reported on 11/11/2022 04/15/22   Corwin Levins, MD  tacrolimus (PROTOPIC) 0.1 % ointment APPLY TOPICALLY TO THE AFFECTED AREA TWICE DAILY.   *Appointment required for future refills* Patient not taking: Reported on 11/11/2022 06/27/22   Nelwyn Salisbury, MD  Tetrahydrozoline HCl (EYE DROPS OP) Apply 2 drops to eye as needed (for redness in eyes).    [provider]    Current Outpatient Medications  Medication Sig Dispense Refill   metoprolol succinate (TOPROL-XL) 100 MG 24 hr tablet Take 1 tablet (100 mg total) by mouth daily. Take with or immediately following a meal. 30 tablet 3   simvastatin (ZOCOR) 20 MG tablet TAKE 1 TABLET BY MOUTH EVERY DAY IN THE EVENING 30 tablet 2   triamcinolone cream (KENALOG) 0.1 % Apply 1 application topically 2 (two) times daily. 45 g 2   triamterene-hydrochlorothiazide (MAXZIDE-25) 37.5-25 MG tablet TAKE 1 TABLET BY MOUTH DAILY 90 tablet 1   azelastine (OPTIVAR) 0.05 % ophthalmic solution INSTILL 1 DROP IN BOTH  EYES TWICE DAILY 6 mL 11   desonide (DESOWEN) 0.05 % lotion Apply topically 2 (two) times daily. (Patient not taking: Reported on 11/11/2022) 59 mL 11   Multiple Vitamin (MULTIVITAMIN WITH MINERALS) TABS Take 1 tablet by mouth daily.     ondansetron (ZOFRAN-ODT) 4 MG disintegrating tablet Take 1 tablet (4 mg total) by mouth every 8 (eight) hours as needed for nausea or vomiting. (Patient not taking: Reported on 11/11/2022) 30 tablet 0   tacrolimus (PROTOPIC) 0.1 % ointment APPLY TOPICALLY TO THE  AFFECTED AREA TWICE DAILY.   *Appointment required for future refills* (Patient not taking: Reported on 11/11/2022) 60 g 0   Tetrahydrozoline HCl (EYE DROPS OP) Apply 2 drops to eye as needed (for redness in eyes).     Current Facility-Administered Medications  Medication Dose Route Frequency Provider Last Rate Last Admin   0.9 %  sodium chloride infusion  500 mL Intravenous Once Claudette Head T, MD       0.9 %  sodium chloride infusion  500 mL Intravenous Once Meryl Dare, MD        Allergies as of 11/28/2022   (No Known Allergies)    Family History  Problem Relation Age of Onset   Asthma Mother    Hypertension Mother    Healthy Father    Asthma Sister    Hypertension Sister    Asthma Brother    Colon cancer Neg Hx    Colon polyps Neg Hx    Rectal cancer Neg Hx    Stomach cancer Neg Hx    Esophageal cancer Neg Hx     Social History   Socioeconomic History   Marital status: Single    Spouse name: Not on file   Number of children: 4   Years of education: 12   Highest education level: Not on file  Occupational History   Occupation: Location manager  Tobacco Use   Smoking status: Never   Smokeless tobacco: Never  Vaping Use   Vaping status: Never Used  Substance and Sexual Activity   Alcohol use: Yes    Comment: Rarely <1/month   Drug use: No   Sexual activity: Not on file  Other Topics Concern   Not on file  Social History Narrative   ** Merged History Encounter **       Fun/Hobby: Dancing, basketball, baseball, going out every once in a while    Social Determinants of Corporate investment banker Strain: Not on file  Food Insecurity: Not on file  Transportation Needs: Not on file  Physical Activity: Not on file  Stress: Not on file  Social Connections: Not on file  Intimate Partner Violence: Not on file    Review of Systems:  All systems reviewed were negative except where noted in HPI.   Physical Exam:  General:  Alert, well-developed, in  NAD Head:  Normocephalic and atraumatic. Eyes:  Sclera clear, no icterus.   Conjunctiva pink. Ears:  Normal auditory acuity. Mouth:  No deformity or lesions.  Neck:  Supple; no masses. Lungs:  Clear throughout to auscultation.   No wheezes, crackles, or rhonchi.  Heart:  Regular rate and rhythm; no murmurs. Abdomen:  Soft, nondistended, nontender. No masses, hepatomegaly. No palpable masses.  Normal bowel sounds.    Rectal:  Deferred   Msk:  Symmetrical without gross deformities. Extremities:  Without edema. Neurologic:  Alert and  oriented x 4; grossly normal neurologically. Skin:  Intact without significant lesions or rashes. Psych:  Alert and cooperative. Normal mood and affect.   Venita Lick. Russella Dar  11/28/2022, 8:00 AM See Loretha Stapler, [facility], to contact our on call provider

## 2022-11-28 NOTE — Telephone Encounter (Signed)
Returned the patient's phone call. Will complete work note for the patient and leave with the receptionist. Pt will pickup today.

## 2022-11-28 NOTE — Patient Instructions (Addendum)
- Repeat colonoscopy in 10 years for surveillance.                           - Resume previous diet.                           - Continue present medications.                           - Consider hemorrhoid banding.  YOU HAD AN ENDOSCOPIC PROCEDURE TODAY AT THE [facility]:   Refer to the procedure report that was given to you for any specific questions about what was found during the examination.  If the procedure report does not answer your questions, please call your gastroenterologist to clarify.  If you requested that your care partner not be given the details of your procedure findings, then the procedure report has been included in a sealed envelope for you to review at your convenience later.  YOU SHOULD EXPECT: Some feelings of bloating in the abdomen. Passage of more gas than usual.  Walking can help get rid of the air that was put into your GI tract during the procedure and reduce the bloating. If you had a lower endoscopy (such as a colonoscopy or flexible sigmoidoscopy) you may notice spotting of blood in your stool or on the toilet paper. If you underwent a bowel prep for your procedure, you may not have a normal bowel movement for a few days.  Please Note:  You might notice some irritation and congestion in your nose or some drainage.  This is from the oxygen used during your procedure.  There is no need for concern and it should clear up in a day or so.  SYMPTOMS TO REPORT IMMEDIATELY:  Following lower endoscopy (colonoscopy or flexible sigmoidoscopy):  Excessive amounts of blood in the stool  Significant tenderness or worsening of abdominal pains  Swelling of the abdomen that is new, acute  Fever of 100F or higher   For urgent or emergent issues, a gastroenterologist can be reached at any hour by calling [phone]. Do not use MyChart messaging for urgent concerns.    DIET:  We do recommend a small meal at first, but then you may  proceed to your regular diet.  Drink plenty of fluids but you should avoid alcoholic beverages for 24 hours.  ACTIVITY:  You should plan to take it easy for the rest of today and you should NOT DRIVE or use heavy machinery until tomorrow (because of the sedation medicines used during the test).    FOLLOW UP: Our staff will call the number listed on your records the next business day following your procedure.  We will call around 7:15- 8:00 am to check on you and address any questions or concerns that you may have regarding the information given to you following your procedure. If we do not reach you, we will leave a message.     If any biopsies were taken you will be contacted by phone or by letter within the next 1-3 weeks.  Please call us at [phone] if you have not heard about the biopsies in 3 weeks.    SIGNATURES/CONFIDENTIALITY: You and/or your care partner have signed paperwork which will be entered into your electronic medical record.  These signatures attest to the fact that that the information above on your  After Visit Summary has been reviewed and is understood.  Full responsibility of the confidentiality of this discharge information lies with you and/or your care-partner.

## 2022-11-28 NOTE — Op Note (Signed)
[facility] Patient Name: Jerry Crawford Procedure Date: 11/28/2022 7:23 AM MRN: [id number] Endoscopist: Meryl Dare , MD, [phone] Age: 62 Referring MD:  Date of Birth: 05-16-1960 Gender: Male Account #: [id number] Procedure:                Colonoscopy Indications:              Surveillance: Personal history of adenomatous                            polyps on last colonoscopy 5 years ago Medicines:                Monitored Anesthesia Care Procedure:                Pre-Anesthesia Assessment:                           - Prior to the procedure, a History and Physical                            was performed, and patient medications and                            allergies were reviewed. The patient's tolerance of                            previous anesthesia was also reviewed. The risks                            and benefits of the procedure and the sedation                            options and risks were discussed with the patient.                            All questions were answered, and informed consent                            was obtained. Prior Anticoagulants: The patient has                            taken no anticoagulant or antiplatelet agents. ASA                            Grade Assessment: II - A patient with mild systemic                            disease. After reviewing the risks and benefits,                            the patient was deemed in satisfactory condition to                            undergo the procedure.  After obtaining informed consent, the colonoscope                            was passed under direct vision. Throughout the                            procedure, the patient's blood pressure, pulse, and                            oxygen saturations were monitored continuously. The                            Olympus Scope SN: [id number] was introduced through                            the anus and  advanced to the the cecum, identified                            by appendiceal orifice and ileocecal valve. The                            ileocecal valve, appendiceal orifice, and rectum                            were photographed. The quality of the bowel                            preparation was excellent. The colonoscopy was                            performed without difficulty. The patient tolerated                            the procedure well. Scope In: 8:06:53 AM Scope Out: 8:17:29 AM Scope Withdrawal Time: 0 hours 7 minutes 24 seconds  Total Procedure Duration: 0 hours 10 minutes 36 seconds  Findings:                 The perianal and digital rectal examinations were                            normal.                           Internal hemorrhoids were found during                            retroflexion. The hemorrhoids were moderate and                            Grade II (internal hemorrhoids that prolapse but                            reduce spontaneously).  The exam was otherwise without abnormality on                            direct and retroflexion views. Complications:            No immediate complications. Estimated blood loss:                            None. Estimated Blood Loss:     Estimated blood loss: none. Impression:               - Internal hemorrhoids.                           - The examination was otherwise normal on direct                            and retroflexion views.                           - No specimens collected. Recommendation:           - Repeat colonoscopy in 10 years for surveillance.                           - Patient has a contact number available for                            emergencies. The signs and symptoms of potential                            delayed complications were discussed with the                            patient. Return to normal activities tomorrow.                            Written  discharge instructions were provided to the                            patient.                           - Resume previous diet.                           - Continue present medications.                           - Consider hemorrhoid banding. Meryl Dare, MD 11/28/2022 8:20:56 AM This report has been signed electronically.

## 2022-11-28 NOTE — Telephone Encounter (Signed)
Inbound call from patient requesting to have a work note for today's colonoscopy. Patient states he would like to come pick it up today to bring into work tomorrow. Please advise, thank you.

## 2022-11-28 NOTE — Progress Notes (Signed)
Sedate, gd SR, tolerated procedure well, VSS, report to RN 

## 2022-11-28 NOTE — Progress Notes (Signed)
VS completed by DT.  Pt's states no medical or surgical changes since previsit or office visit.  

## 2022-11-29 ENCOUNTER — Telehealth: Payer: Self-pay

## 2022-11-29 ENCOUNTER — Telehealth: Payer: Self-pay | Admitting: *Deleted

## 2022-11-29 NOTE — Telephone Encounter (Signed)
-----   Message from [facility] M sent at 11/28/2022 10:01 AM EDT -----  ----- Message ----- From: Meryl Dare, MD Sent: 11/28/2022   8:27 AM EDT To: Illene Bolus, CMA  Please call this patient Tues or Wed to discuss, schedule hemorrhoid banding if he wants to proceed. He had colonoscopy today and we briefly discussed this option. Thx

## 2022-11-29 NOTE — Telephone Encounter (Signed)
Left message for patient to return my call.

## 2022-11-29 NOTE — Telephone Encounter (Signed)
Attempted f/u phone call. No answer. Left message. °

## 2022-11-29 NOTE — Telephone Encounter (Signed)
Patient scheduled for first hemorrhoid banding on 12/20/22 at 11:10 am. Patient aware.

## 2022-12-20 ENCOUNTER — Encounter: Payer: Self-pay | Admitting: Gastroenterology

## 2022-12-20 ENCOUNTER — Ambulatory Visit (INDEPENDENT_AMBULATORY_CARE_PROVIDER_SITE_OTHER): Payer: BC Managed Care – PPO | Admitting: Gastroenterology

## 2022-12-20 VITALS — BP 130/74 | HR 55 | Ht 71.0 in | Wt 215.0 lb

## 2022-12-20 DIAGNOSIS — K641 Second degree hemorrhoids: Secondary | ICD-10-CM | POA: Diagnosis not present

## 2022-12-20 NOTE — Patient Instructions (Signed)
HEMORRHOID BANDING PROCEDURE    FOLLOW-UP CARE   The procedure you have had should have been relatively painless since the banding of the area involved does not have nerve endings and there is no pain sensation.  The rubber band cuts off the blood supply to the hemorrhoid and the band may fall off as soon as 48 hours after the banding (the band may occasionally be seen in the toilet bowl following a bowel movement). You may notice a temporary feeling of fullness in the rectum which should respond adequately to plain Tylenol or Motrin.  Following the banding, avoid strenuous exercise that evening and resume full activity the next day.  A sitz bath (soaking in a warm tub) or bidet is soothing, and can be useful for cleansing the area after bowel movements.     To avoid constipation, take two tablespoons of natural wheat bran, natural oat bran, flax, Benefiber or any over the counter fiber supplement and increase your water intake to 7-8 glasses daily.    Unless you have been prescribed anorectal medication, do not put anything inside your rectum for two weeks: No suppositories, enemas, fingers, etc.  Occasionally, you may have more bleeding than usual after the banding procedure.  This is often from the untreated hemorrhoids rather than the treated one.  Don't be concerned if there is a tablespoon or so of blood.  If there is more blood than this, lie flat with your bottom higher than your head and apply an ice pack to the area. If the bleeding does not stop within a half an hour or if you feel faint, call our office at [phone] or go to the emergency room.  Problems are not common; however, if there is a substantial amount of bleeding, severe pain, chills, fever or difficulty passing urine (very rare) or other problems, you should call us at [phone] or report to the nearest emergency room.  Do not stay seated continuously for more than 2-3 hours for a day or two after the procedure.   Tighten your buttock muscles 10-15 times every two hours and take 10-15 deep breaths every 1-2 hours.  Do not spend more than a few minutes on the toilet if you cannot empty your bowel; instead re-visit the toilet at a later time.    Thank you for choosing me and [facility].  Malcolm T. Stark, Jr., MD., FACG  

## 2022-12-20 NOTE — Progress Notes (Signed)
PROCEDURE NOTE: The patient presents with symptomatic grade II hemorrhoids, requesting rubber band ligation of their hemorrhoidal disease.  All risks, benefits and alternative forms of therapy were described and informed consent was obtained.  The anorectum was pre-medicated during digital exam with 0.125% NTG and 5% lidocaine.  In the Left Lateral Decubitus position anoscopic examination revealed grade II hemorrhoids in the  RP > RA > LL positions. External tags and RP hemorrhoid with a small thrombosis.   The decision was made to band the RA internal hemorrhoid, and the [facility] System was used to perform band ligation without complication.  Digital anorectal examination was then performed to assure proper positioning of the band and to adjust the banded tissue as required. No adjustment was needed.  No complications were encountered and the patient tolerated the procedure well.  Dietary and behavioral recommendations were given and along with follow-up instructions.   Return for possible additional hemorrhoid banding in 2 - 3 weeks as required.  High fiber diet, Benefiber daily and at least 8 glasses of water daily. The patient was discharged home without pain or other post procedure problems.   Tylenol 1-2 q6h prn Prep H cream or supp PR bid prn

## 2022-12-23 ENCOUNTER — Telehealth: Payer: Self-pay | Admitting: Gastroenterology

## 2022-12-23 NOTE — Telephone Encounter (Signed)
Patient called stated he had a banding procedure done and is having a lot of discomfort. Please advise.

## 2022-12-23 NOTE — Telephone Encounter (Signed)
The pt states that he was scared to have a BM after banding and now is constipated. He had a BM that was very hard and painful with straining.  He wanted to be sure he did not cause any issues with the banding.  We discussed that he needs to take a dose of miralax and titrate as needed to keep bowels soft.  He will keep the area clean and dry and if symptoms do not resolve or get worse he should call back.

## 2023-01-11 ENCOUNTER — Ambulatory Visit: Payer: BC Managed Care – PPO | Admitting: Gastroenterology

## 2023-01-11 ENCOUNTER — Encounter: Payer: Self-pay | Admitting: Gastroenterology

## 2023-01-11 VITALS — BP 130/70 | HR 76 | Ht 70.5 in | Wt 218.5 lb

## 2023-01-11 DIAGNOSIS — K641 Second degree hemorrhoids: Secondary | ICD-10-CM | POA: Diagnosis not present

## 2023-01-11 NOTE — Progress Notes (Signed)
PROCEDURE NOTE: The patient presents with symptomatic bleeding grade II hemorrhoids, requesting rubber band ligation of their hemorrhoidal disease.  All risks, benefits and alternative forms of therapy were described and informed consent was obtained.  DRE with a RP thrombosed hemorrhoid otherwise negative  RA hemorrhoid banded on 9/10 with a decrease in symptoms   The anorectum was pre-medicated during digital exam with 0.125% NTG and 5% lidocaine.  The decision was made to band the LL internal hemorrhoid, and the [facility] System was used to perform band ligation without complication.  Digital anorectal examination was then performed to assure proper positioning of the band and to adjust the banded tissue as required. No adjustment was needed.  No complications were encountered and the patient tolerated the procedure well.  Dietary and behavioral recommendations were given and along with follow-up instructions.   Return for possible additional hemorrhoid banding in 2 - 3 weeks as required.  Tylenol 1-2 po q6h prn  High fiber diet, Benefiber daily and at least 8 glasses of water daily. Hydrocortisone 2.5% cream bid for 10 days then bid prn The patient was discharged home without pain or other post procedure problems.

## 2023-01-11 NOTE — Patient Instructions (Addendum)
Purchase over the counter hydrocortisone cream 1% cream to apply twice daily to thrombosed hemorrhoid.   HEMORRHOID BANDING PROCEDURE    FOLLOW-UP CARE   The procedure you have had should have been relatively painless since the banding of the area involved does not have nerve endings and there is no pain sensation.  The rubber band cuts off the blood supply to the hemorrhoid and the band may fall off as soon as 48 hours after the banding (the band may occasionally be seen in the toilet bowl following a bowel movement). You may notice a temporary feeling of fullness in the rectum which should respond adequately to plain Tylenol or Motrin.  Following the banding, avoid strenuous exercise that evening and resume full activity the next day.  A sitz bath (soaking in a warm tub) or bidet is soothing, and can be useful for cleansing the area after bowel movements.     To avoid constipation, take two tablespoons of natural wheat bran, natural oat bran, flax, Benefiber or any over the counter fiber supplement and increase your water intake to 7-8 glasses daily.    Unless you have been prescribed anorectal medication, do not put anything inside your rectum for two weeks: No suppositories, enemas, fingers, etc.  Occasionally, you may have more bleeding than usual after the banding procedure.  This is often from the untreated hemorrhoids rather than the treated one.  Don't be concerned if there is a tablespoon or so of blood.  If there is more blood than this, lie flat with your bottom higher than your head and apply an ice pack to the area. If the bleeding does not stop within a half an hour or if you feel faint, call our office at [phone] or go to the emergency room.  Problems are not common; however, if there is a substantial amount of bleeding, severe pain, chills, fever or difficulty passing urine (very rare) or other problems, you should call us at [phone] or report to the nearest  emergency room.  Do not stay seated continuously for more than 2-3 hours for a day or two after the procedure.  Tighten your buttock muscles 10-15 times every two hours and take 10-15 deep breaths every 1-2 hours.  Do not spend more than a few minutes on the toilet if you cannot empty your bowel; instead re-visit the toilet at a later time.    Thank you for choosing me and [facility].  Venita Lick. Pleas Koch., MD., Clementeen Graham

## 2023-01-20 ENCOUNTER — Other Ambulatory Visit: Payer: Self-pay | Admitting: Family Medicine

## 2023-01-20 DIAGNOSIS — E785 Hyperlipidemia, unspecified: Secondary | ICD-10-CM

## 2023-01-25 ENCOUNTER — Other Ambulatory Visit: Payer: Self-pay | Admitting: Family Medicine

## 2023-02-06 ENCOUNTER — Encounter: Payer: Self-pay | Admitting: Gastroenterology

## 2023-02-06 ENCOUNTER — Ambulatory Visit: Payer: BC Managed Care – PPO | Admitting: Gastroenterology

## 2023-02-06 VITALS — BP 124/82 | HR 80 | Ht 71.0 in | Wt 222.0 lb

## 2023-02-06 DIAGNOSIS — K641 Second degree hemorrhoids: Secondary | ICD-10-CM | POA: Diagnosis not present

## 2023-02-06 NOTE — Progress Notes (Signed)
PROCEDURE NOTE: The patient presents with symptomatic grade II hemorrhoids, requesting rubber band ligation of their hemorrhoidal disease.  All risks, benefits and alternative forms of therapy were described and informed consent was obtained. RA, LL previously banded with substantially improved symptoms  The anorectum was pre-medicated during digital exam with 0.125% NTG and 5% lidocaine.  The decision was made to band the RP internal hemorrhoid, and the [facility] System was used to perform band ligation without complication.  Digital anorectal examination was then performed to assure proper positioning of the band and to adjust the banded tissue as required. No adjustment was needed.  No complications were encountered and the patient tolerated the procedure well.  Dietary and behavioral recommendations were given and along with follow-up instructions.   Tylenol ES 1-2 po q6h prn pain and/or Advil 400 mg po q6h prn pain High fiber diet, Benefiber daily and at least 8 glasses of water daily. Colace 1-2 po qd if stools are hard The patient was discharged home without pain or other post procedure problems.   GI follow up with Dr. Barron Alvine

## 2023-02-06 NOTE — Patient Instructions (Signed)
If you need any further bandings, please call our office and schedule an appointment to see Dr. Barron Alvine.   HEMORRHOID BANDING PROCEDURE    FOLLOW-UP CARE   The procedure you have had should have been relatively painless since the banding of the area involved does not have nerve endings and there is no pain sensation.  The rubber band cuts off the blood supply to the hemorrhoid and the band may fall off as soon as 48 hours after the banding (the band may occasionally be seen in the toilet bowl following a bowel movement). You may notice a temporary feeling of fullness in the rectum which should respond adequately to plain Tylenol or Motrin.  Following the banding, avoid strenuous exercise that evening and resume full activity the next day.  A sitz bath (soaking in a warm tub) or bidet is soothing, and can be useful for cleansing the area after bowel movements.     To avoid constipation, take two tablespoons of natural wheat bran, natural oat bran, flax, Benefiber or any over the counter fiber supplement and increase your water intake to 7-8 glasses daily.    Unless you have been prescribed anorectal medication, do not put anything inside your rectum for two weeks: No suppositories, enemas, fingers, etc.  Occasionally, you may have more bleeding than usual after the banding procedure.  This is often from the untreated hemorrhoids rather than the treated one.  Don't be concerned if there is a tablespoon or so of blood.  If there is more blood than this, lie flat with your bottom higher than your head and apply an ice pack to the area. If the bleeding does not stop within a half an hour or if you feel faint, call our office at [phone] or go to the emergency room.  Problems are not common; however, if there is a substantial amount of bleeding, severe pain, chills, fever or difficulty passing urine (very rare) or other problems, you should call us at [phone] or report to the nearest  emergency room.  Do not stay seated continuously for more than 2-3 hours for a day or two after the procedure.  Tighten your buttock muscles 10-15 times every two hours and take 10-15 deep breaths every 1-2 hours.  Do not spend more than a few minutes on the toilet if you cannot empty your bowel; instead re-visit the toilet at a later time.

## 2023-03-29 ENCOUNTER — Inpatient Hospital Stay: Payer: BC Managed Care – PPO | Admitting: Hematology and Oncology

## 2023-03-29 ENCOUNTER — Inpatient Hospital Stay: Payer: BC Managed Care – PPO | Attending: Internal Medicine

## 2023-03-29 VITALS — BP 125/79 | HR 59 | Temp 97.6°F | Resp 18 | Ht 71.0 in | Wt 223.1 lb

## 2023-03-29 DIAGNOSIS — Z79899 Other long term (current) drug therapy: Secondary | ICD-10-CM | POA: Diagnosis not present

## 2023-03-29 DIAGNOSIS — Z79621 Long term (current) use of calcineurin inhibitor: Secondary | ICD-10-CM | POA: Insufficient documentation

## 2023-03-29 DIAGNOSIS — D709 Neutropenia, unspecified: Secondary | ICD-10-CM | POA: Diagnosis not present

## 2023-03-29 DIAGNOSIS — R7303 Prediabetes: Secondary | ICD-10-CM | POA: Diagnosis not present

## 2023-03-29 DIAGNOSIS — E785 Hyperlipidemia, unspecified: Secondary | ICD-10-CM | POA: Insufficient documentation

## 2023-03-29 DIAGNOSIS — I10 Essential (primary) hypertension: Secondary | ICD-10-CM | POA: Diagnosis not present

## 2023-03-29 LAB — CBC WITH DIFFERENTIAL (CANCER CENTER ONLY)
Abs Immature Granulocytes: 0 10*3/uL (ref 0.00–0.07)
Basophils Absolute: 0 10*3/uL (ref 0.0–0.1)
Basophils Relative: 1 %
Eosinophils Absolute: 0.2 10*3/uL (ref 0.0–0.5)
Eosinophils Relative: 6 %
HCT: 49.9 % (ref 39.0–52.0)
Hemoglobin: 16.5 g/dL (ref 13.0–17.0)
Immature Granulocytes: 0 %
Lymphocytes Relative: 45 %
Lymphs Abs: 1.5 10*3/uL (ref 0.7–4.0)
MCH: 29.2 pg (ref 26.0–34.0)
MCHC: 33.1 g/dL (ref 30.0–36.0)
MCV: 88.3 fL (ref 80.0–100.0)
Monocytes Absolute: 0.4 10*3/uL (ref 0.1–1.0)
Monocytes Relative: 12 %
Neutro Abs: 1.2 10*3/uL — ABNORMAL LOW (ref 1.7–7.7)
Neutrophils Relative %: 36 %
Platelet Count: 197 10*3/uL (ref 150–400)
RBC: 5.65 MIL/uL (ref 4.22–5.81)
RDW: 12.7 % (ref 11.5–15.5)
WBC Count: 3.3 10*3/uL — ABNORMAL LOW (ref 4.0–10.5)
nRBC: 0 % (ref 0.0–0.2)

## 2023-03-29 NOTE — Assessment & Plan Note (Signed)
Lab review: 03/30/2012: WBC 4.8, ANC 2.5 ALC 2 06/15/2016: WBC 3.8 04/21/2017: WBC 3.8, ANC 1.4, ALC 1.8 06/05/2018: WBC 3.3 ANC 1.4, ALC 1.4 05/08/20: WBC 3.3, ANC 1.4, B 12: 234, Folate 23, ANA Neg 06/26/20: WBC 3, ANC 1.3 08/14/2020: WBC 3, ANC 1 02/25/2021: WBC 3.4, ANC 1.2 02/26/2021: WBC 4.1, ANC 1.5 07/08/2021: WBC 3.5, hemoglobin 17.2, ANC 1.3 03/28/2022: WBC 4, hemoglobin 16.4, ANC 1.8   Bone Marrow Biopsy: 08/20/20: Normocellular bone marrow 40 to 60% cellularity with trilineage hematopoiesis.  No dysplastic cells. Cytogenetics and FISH: Normal   Elevated hemoglobin: It has come back down to normal. I encouraged him to drink more water.   Since the labs look relatively stable, we can recheck his labs in 1 year

## 2023-03-29 NOTE — Progress Notes (Signed)
Patient Care Team: Nelwyn Salisbury, MD as PCP - General (Family Medicine) Rollene Rotunda, MD as PCP - Cardiology (Cardiology)  DIAGNOSIS:  Encounter Diagnosis  Name Primary?   Neutropenia, unspecified type (HCC) Yes    CHIEF COMPLIANT: Follow-up of leukopenia  HISTORY OF PRESENT ILLNESS:   History of Present Illness   Jerry Crawford, a patient with a history of leucopenia  presents for a routine follow-up. He reports feeling generally well with no recent infections or significant health issues. He has been on simvastatin for several years and expresses a desire to potentially discontinue the medication. He underwent a colonoscopy two months ago and reports no complications. He also had hemorrhoids treated with banding, which has resulted in improved symptoms, including a reduction in constipation, pain, and bleeding. He has noticed fluctuations in his neutrophil count over the past two years, with the current count at 1.2. His A1C was noted to be 6.9 in June of the previous year. He expresses a desire to improve his health through exercise and diet, and has been incorporating beet powder into his regimen.         ALLERGIES:  has no known allergies.  MEDICATIONS:  Current Outpatient Medications  Medication Sig Dispense Refill   azelastine (OPTIVAR) 0.05 % ophthalmic solution INSTILL 1 DROP IN BOTH EYES TWICE DAILY 6 mL 11   desonide (DESOWEN) 0.05 % lotion Apply topically 2 (two) times daily. 59 mL 11   metoprolol succinate (TOPROL-XL) 100 MG 24 hr tablet TAKE 1 TABLET(100 MG) BY MOUTH DAILY WITH OR IMMEDIATELY FOLLOWING A MEAL 30 tablet 3   Multiple Vitamin (MULTIVITAMIN WITH MINERALS) TABS Take 1 tablet by mouth daily.     ondansetron (ZOFRAN-ODT) 4 MG disintegrating tablet Take 1 tablet (4 mg total) by mouth every 8 (eight) hours as needed for nausea or vomiting. 30 tablet 0   polyethylene glycol powder (GLYCOLAX/MIRALAX) 17 GM/SCOOP powder Take 17 g by mouth as needed.     simvastatin  (ZOCOR) 20 MG tablet TAKE 1 TABLET BY MOUTH EVERY DAY IN THE EVENING 30 tablet 2   tacrolimus (PROTOPIC) 0.1 % ointment APPLY TOPICALLY TO THE AFFECTED AREA TWICE DAILY.   *Appointment required for future refills* 60 g 0   Tetrahydrozoline HCl (EYE DROPS OP) Apply 2 drops to eye as needed (for redness in eyes).     triamcinolone cream (KENALOG) 0.1 % Apply 1 application topically 2 (two) times daily. 45 g 2   triamterene-hydrochlorothiazide (MAXZIDE-25) 37.5-25 MG tablet TAKE 1 TABLET BY MOUTH DAILY 90 tablet 1   Current Facility-Administered Medications  Medication Dose Route Frequency Provider Last Rate Last Admin   0.9 %  sodium chloride infusion  500 mL Intravenous Once Meryl Dare, MD        PHYSICAL EXAMINATION: ECOG PERFORMANCE STATUS: 1 - Symptomatic but completely ambulatory  Vitals:   03/29/23 0805  BP: 125/79  Pulse: (!) 59  Resp: 18  Temp: 97.6 F (36.4 C)  SpO2: 100%   Filed Weights   03/29/23 0805  Weight: 223 lb 1.6 oz (101.2 kg)    Physical Exam   MEASUREMENTS: WT- 223        LABORATORY DATA:  I have reviewed the data as listed    Latest Ref Rng & Units 09/15/2022   10:45 AM 07/08/2021   10:20 AM 02/25/2021    8:19 AM  CMP  Glucose 70 - 99 mg/dL 161  096  045   BUN 6 - 23 mg/dL 16  21  21   Creatinine 0.40 - 1.50 mg/dL 3.08  6.57  8.46   Sodium 135 - 145 mEq/L 137  139  138   Potassium 3.5 - 5.1 mEq/L 4.4  4.3  3.8   Chloride 96 - 112 mEq/L 98  100  99   CO2 19 - 32 mEq/L 30  30  31    Calcium 8.4 - 10.5 mg/dL 9.8  96.2  9.8   Total Protein 6.0 - 8.3 g/dL 7.2  7.1    Total Bilirubin 0.2 - 1.2 mg/dL 1.6  1.5    Alkaline Phos 39 - 117 U/L 57  51    AST 0 - 37 U/L 23  21    ALT 0 - 53 U/L 25  26      Lab Results  Component Value Date   WBC 3.3 (L) 03/29/2023   HGB 16.5 03/29/2023   HCT 49.9 03/29/2023   MCV 88.3 03/29/2023   PLT 197 03/29/2023   NEUTROABS 1.2 (L) 03/29/2023    ASSESSMENT & PLAN:  Leukopenia Lab review: 03/30/2012: WBC  4.8, ANC 2.5 ALC 2 06/15/2016: WBC 3.8 04/21/2017: WBC 3.8, ANC 1.4, ALC 1.8 06/05/2018: WBC 3.3 ANC 1.4, ALC 1.4 05/08/20: WBC 3.3, ANC 1.4, B 12: 234, Folate 23, ANA Neg 06/26/20: WBC 3, ANC 1.3 08/14/2020: WBC 3, ANC 1 02/25/2021: WBC 3.4, ANC 1.2 02/26/2021: WBC 4.1, ANC 1.5 07/08/2021: WBC 3.5, hemoglobin 17.2, ANC 1.3 03/28/2022: WBC 4, hemoglobin 16.4, ANC 1.8 03/29/2023: WBC 3.3, ANC 1.2, hemoglobin 16.5   Bone Marrow Biopsy: 08/20/20: Normocellular bone marrow 40 to 60% cellularity with trilineage hematopoiesis.  No dysplastic cells. Cytogenetics and FISH: Normal   Elevated hemoglobin: It has come back down to normal. I encouraged him to drink more water.   Since the labs look relatively stable, we can recheck his labs in 1 year ------------------------------------- Assessment and Plan    Hyperlipidemia Stable on Simvastatin with a desire to discontinue medication. Discussed the benefits of exercise and weight loss in managing cholesterol levels. -Continue Simvastatin. -Encouraged to increase physical activity and maintain a healthy diet.  Hypertension Stable on medication. Discussed the benefits of exercise and weight loss in managing blood pressure levels. -Continue current antihypertensive medication. -Encouraged to increase physical activity and maintain a healthy diet.  Pre-diabetes Last A1C was 6.9 in June 2024. Discussed the benefits of exercise and weight loss in managing blood glucose levels. -Encouraged to increase physical activity and maintain a healthy diet. -Plan to check A1C at next physical in January.  General Health Maintenance -Encouraged to continue with regular physicals. -Encouraged to consider COVID-19 vaccination.  Follow-up in 1 year.          Orders Placed This Encounter  Procedures   CBC with Differential (Cancer Center Only)    Standing Status:   Future    Expiration Date:   03/28/2024   The patient has a good understanding of the  overall plan. he agrees with it. he will call with any problems that may develop before the next visit here. Total time spent: 30 mins including face to face time and time spent for planning, charting and co-ordination of care   Tamsen Meek, MD 03/29/23

## 2023-04-19 ENCOUNTER — Other Ambulatory Visit: Payer: Self-pay | Admitting: Family Medicine

## 2023-04-19 DIAGNOSIS — E785 Hyperlipidemia, unspecified: Secondary | ICD-10-CM

## 2023-05-30 ENCOUNTER — Other Ambulatory Visit: Payer: Self-pay | Admitting: Family Medicine

## 2023-06-29 ENCOUNTER — Ambulatory Visit: Payer: BC Managed Care – PPO | Admitting: Dermatology

## 2023-06-29 VITALS — BP 137/87

## 2023-06-29 DIAGNOSIS — L608 Other nail disorders: Secondary | ICD-10-CM | POA: Diagnosis not present

## 2023-06-29 DIAGNOSIS — L309 Dermatitis, unspecified: Secondary | ICD-10-CM | POA: Diagnosis not present

## 2023-06-29 DIAGNOSIS — L219 Seborrheic dermatitis, unspecified: Secondary | ICD-10-CM | POA: Diagnosis not present

## 2023-06-29 DIAGNOSIS — L816 Other disorders of diminished melanin formation: Secondary | ICD-10-CM | POA: Diagnosis not present

## 2023-06-29 DIAGNOSIS — L819 Disorder of pigmentation, unspecified: Secondary | ICD-10-CM

## 2023-06-29 MED ORDER — CLOBETASOL PROPIONATE 0.05 % EX OINT: 1.0000 | TOPICAL_OINTMENT | Freq: Two times a day (BID) | CUTANEOUS | 2 refills | Status: DC

## 2023-06-29 MED ORDER — TACROLIMUS 0.1 % EX OINT: TOPICAL_OINTMENT | CUTANEOUS | 0 refills | Status: AC

## 2023-06-29 NOTE — Progress Notes (Signed)
   New Patient Visit   Subjective  Jerry Crawford is a 63 y.o. male who presents for the following: Vitiligo  Patient states he  has vitiligo located at the hands that he  would like to have examined. Patient reports the areas have been there for 2 years. He reports the areas are not bothersome.No irritation He states that the areas have not spread. Patient reports he has previously been treated for these areas. Patient was give Tarcrolimus 0.1% ointment by PCP and uses it once a day.     The following portions of the chart were reviewed this encounter and updated as appropriate: medications, allergies, medical history  Review of Systems:  No other skin or systemic complaints except as noted in HPI or Assessment and Plan.  Objective  Well appearing patient in no apparent distress; mood and affect are within normal limits.   A focused examination was performed of the following areas: hands   Relevant exam findings are noted in the Assessment and Plan.    Assessment & Plan   1. Post Inflammatory hypopigmentation - Assessment: Patient initially self-diagnosed with vitiligo,  Condition is more likely inflammatory hypopigmentation caused by over-washing and exposure to chemicals. 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 - Plan:    Apply clobetasol twice daily for 3 weeks    Switch to pimecrolimus for 3 weeks    Alternate between clobetasol and pimecrolimus    Use moisturizers after hand washing    Follow-up in 3 months    Expect improvement in 3 months  2. Melanonychia - Assessment: Dark nails, started about 3 years ago, beginning with one nail and spreading to others. Physical examination revealed melanonychia with longitudinal hyperpigmented ridges affecting all nails, without Hutchinson sign. assessed this as a normal variant of aging in people of color, not indicative of melanoma or skin cancer. -  Plan:    Take pictures for monitoring    No active treatment required, continue yearly monitoring   3. Seborrheic dermatitis - Assessment: dryness and flakiness on the scalp, - Plan:    Use DHS Zinc shampoo    Let shampoo sit for 2 minutes before rinsing  4. Hand Dermatitis  - Assessment: Patient reports occasional dryness on hands from overwashing,  - Plan:    Apply clobetasol to hands and affected areas    Return in about 3 months (around 09/29/2023) for follow up for dermatitis.Samara Deist, CMA, am acting as scribe for [employer], DO.   Documentation: I have reviewed the above documentation for accuracy and completeness, and I agree with the above.  Langston Reusing, DO

## 2023-06-29 NOTE — Patient Instructions (Signed)
 Hello Jerry Crawford,  Thank you for visiting today. Here is a summary of the key instructions:  - Medications:   - Apply clobetasol twice a day for 3 weeks, then switch to pimecrolimus for 3 weeks   - Alternate between clobetasol and pimecrolimus every 3 weeks   - Use clobetasol on hands and affected areas for eczema  - Skin Care:   - Use lots of moisturizers after washing hands   - Apply clobetasol or pimecrolimus every morning and night  - Hair Care:   - Use DHS Zinc shampoo for scalp   - Let shampoo sit for 2 minutes before rinsing  - Lifestyle Changes:   - Reduce hand washing and use of hand sanitizer  - Follow-up:   - Return for follow-up appointment in 3 months  - Other:   - Expect improvement in skin condition in 3 months   - Samples of hand moisturizers will be provided  We look forward to seeing the positive changes in your next visit. If you have any questions or concerns before then, please do not hesitate to contact our office.  Warm regards,  Dr. [facility]   Important Information  Due to recent changes in healthcare laws, you may see results of your pathology and/or laboratory studies on MyChart before the doctors have had a chance to review them. We understand that in some cases there may be results that are confusing or concerning to you. Please understand that not all results are received at the same time and often the doctors may need to interpret multiple results in order to provide you with the best plan of care or course of treatment. Therefore, we ask that you please give [place] 2 business days to thoroughly review all your results before contacting the office for clarification. Should we see a critical lab result, you will be contacted sooner.   If You Need Anything After Your Visit  If you have any questions or concerns for your doctor, please call our main line at [phone] If no one answers, please leave a voicemail as directed and we will  return your call as soon as possible. Messages left after 4 pm will be answered the following business day.   You may also send [place] a message via MyChart. We typically respond to MyChart messages within 1-2 business days.  For prescription refills, please ask your pharmacy to contact our office. Our fax number is [phone].  If you have an urgent issue when the clinic is closed that cannot wait until the next business day, you can page your doctor at the number below.    Please note that while we do our best to be available for urgent issues outside of office hours, we are not available 24/7.   If you have an urgent issue and are unable to reach [place], you may choose to seek medical care at your doctor's office, retail clinic, urgent care center, or emergency room.  If you have a medical emergency, please immediately call 911 or go to the emergency department. In the event of inclement weather, please call our main line at [phone] for an update on the status of any delays or closures.  Dermatology Medication Tips: Please keep the boxes that topical medications come in in order to help keep track of the instructions about where and how to use these. Pharmacies typically print the medication instructions only on the boxes and not directly on the medication tubes.   If your medication  is too expensive, please contact our office at [phone] or send [place] a message through MyChart.   We are unable to tell what your co-pay for medications will be in advance as this is different depending on your insurance coverage. However, we may be able to find a substitute medication at lower cost or fill out paperwork to get insurance to cover a needed medication.   If a prior authorization is required to get your medication covered by your insurance company, please allow [place] 1-2 business days to complete this process.  Drug prices often vary depending on where the prescription is filled and some pharmacies  may offer cheaper prices.  The website www.goodrx.com contains coupons for medications through different pharmacies. The prices here do not account for what the cost may be with help from insurance (it may be cheaper with your insurance), but the website can give you the price if you did not use any insurance.  - You can print the associated coupon and take it with your prescription to the pharmacy.  - You may also stop by our office during regular business hours and pick up a GoodRx coupon card.  - If you need your prescription sent electronically to a different pharmacy, notify our office through [facility] or by phone at [phone]

## 2023-07-10 ENCOUNTER — Encounter: Payer: Self-pay | Admitting: Dermatology

## 2023-07-16 ENCOUNTER — Other Ambulatory Visit: Payer: Self-pay | Admitting: Family Medicine

## 2023-07-16 DIAGNOSIS — J302 Other seasonal allergic rhinitis: Secondary | ICD-10-CM

## 2023-07-19 ENCOUNTER — Encounter: Payer: Self-pay | Admitting: Family Medicine

## 2023-07-19 ENCOUNTER — Ambulatory Visit: Admitting: Family Medicine

## 2023-07-19 VITALS — BP 110/76 | HR 86 | Temp 99.1°F | Wt 221.0 lb

## 2023-07-19 DIAGNOSIS — I10 Essential (primary) hypertension: Secondary | ICD-10-CM

## 2023-07-19 DIAGNOSIS — J302 Other seasonal allergic rhinitis: Secondary | ICD-10-CM

## 2023-07-19 DIAGNOSIS — E785 Hyperlipidemia, unspecified: Secondary | ICD-10-CM | POA: Diagnosis not present

## 2023-07-19 MED ORDER — SIMVASTATIN 20 MG PO TABS: 20.0000 mg | ORAL_TABLET | Freq: Every evening | ORAL | 3 refills | Status: AC

## 2023-07-19 MED ORDER — METHYLPREDNISOLONE ACETATE 40 MG/ML IJ SUSP
40.0000 mg | Freq: Once | INTRAMUSCULAR | Status: AC
  Administered 2023-07-19: 40 mg via INTRAMUSCULAR

## 2023-07-19 MED ORDER — METHYLPREDNISOLONE ACETATE 80 MG/ML IJ SUSP
80.0000 mg | Freq: Once | INTRAMUSCULAR | Status: AC
  Administered 2023-07-19: 80 mg via INTRAMUSCULAR

## 2023-07-19 MED ORDER — TRIAMTERENE-HCTZ 37.5-25 MG PO TABS: 1.0000 | ORAL_TABLET | Freq: Every day | ORAL | 3 refills | Status: DC

## 2023-07-19 MED ORDER — IPRATROPIUM BROMIDE 0.03 % NA SOLN: 2.0000 | Freq: Two times a day (BID) | NASAL | 12 refills | Status: AC

## 2023-07-19 NOTE — Addendum Note (Signed)
 Addended by: Carola Rhine on: 07/19/2023 04:32 PM   Modules accepted: Orders

## 2023-07-19 NOTE — Progress Notes (Signed)
   Subjective:    Patient ID: Jerry Crawford, male    DOB: Nov 24, 1960, 63 y.o.   MRN: [id number]  HPI Here for one week of stuffy head, PND, itchy eyes, and sneezing. No fever or cough. Using Allegra and some of his daughter's Ipratropium nasal spray.    Review of Systems  Constitutional: Negative.   HENT:  Positive for congestion, postnasal drip, rhinorrhea and sneezing. Negative for ear pain and sore throat.   Eyes:  Positive for itching.  Respiratory: Negative.         Objective:   Physical Exam Constitutional:      Appearance: Normal appearance.  HENT:     Right Ear: Tympanic membrane, ear canal and external ear normal.     Left Ear: Tympanic membrane, ear canal and external ear normal.     Nose: Nose normal.     Mouth/Throat:     Pharynx: Oropharynx is clear.  Eyes:     Conjunctiva/sclera: Conjunctivae normal.  Pulmonary:     Effort: Pulmonary effort is normal.     Breath sounds: Normal breath sounds.  Lymphadenopathy:     Cervical: No cervical adenopathy.  Neurological:     Mental Status: He is alert.           Assessment & Plan:  Seasonal allergies. He will continue taking Allegra. We will send in a RX for his own Ipratropium sprays. Given a shot of DepoMedrol.  Gershon Crane, MD

## 2023-07-22 ENCOUNTER — Other Ambulatory Visit: Payer: Self-pay | Admitting: Family Medicine

## 2023-07-22 DIAGNOSIS — E785 Hyperlipidemia, unspecified: Secondary | ICD-10-CM

## 2023-09-06 ENCOUNTER — Ambulatory Visit: Admitting: Dermatology

## 2023-09-18 ENCOUNTER — Ambulatory Visit (INDEPENDENT_AMBULATORY_CARE_PROVIDER_SITE_OTHER): Admitting: Family Medicine

## 2023-09-18 ENCOUNTER — Encounter: Payer: Self-pay | Admitting: Family Medicine

## 2023-09-18 VITALS — BP 120/78 | HR 73 | Temp 98.1°F | Ht 71.0 in | Wt 223.0 lb

## 2023-09-18 DIAGNOSIS — Z13220 Encounter for screening for lipoid disorders: Secondary | ICD-10-CM

## 2023-09-18 DIAGNOSIS — Z0000 Encounter for general adult medical examination without abnormal findings: Secondary | ICD-10-CM | POA: Diagnosis not present

## 2023-09-18 DIAGNOSIS — R1010 Upper abdominal pain, unspecified: Secondary | ICD-10-CM

## 2023-09-18 DIAGNOSIS — Z131 Encounter for screening for diabetes mellitus: Secondary | ICD-10-CM

## 2023-09-18 DIAGNOSIS — I83893 Varicose veins of bilateral lower extremities with other complications: Secondary | ICD-10-CM

## 2023-09-18 DIAGNOSIS — Z125 Encounter for screening for malignant neoplasm of prostate: Secondary | ICD-10-CM | POA: Diagnosis not present

## 2023-09-18 LAB — BASIC METABOLIC PANEL WITH GFR
BUN: 16 mg/dL (ref 6–23)
CO2: 31 meq/L (ref 19–32)
Calcium: 9.5 mg/dL (ref 8.4–10.5)
Chloride: 102 meq/L (ref 96–112)
Creatinine, Ser: 0.86 mg/dL (ref 0.40–1.50)
GFR: 93.87 mL/min (ref >60.00)
Glucose, Bld: 144 mg/dL — ABNORMAL HIGH (ref 70–99)
Potassium: 4 meq/L (ref 3.5–5.1)
Sodium: 138 meq/L (ref 135–145)

## 2023-09-18 LAB — LIPID PANEL
Cholesterol: 137 mg/dL (ref 0–200)
HDL: 43.4 mg/dL (ref >39.00)
LDL Cholesterol: 73 mg/dL (ref 0–99)
NonHDL: 93.68
Total CHOL/HDL Ratio: 3
Triglycerides: 105 mg/dL (ref 0.0–149.0)
VLDL: 21 mg/dL (ref 0.0–40.0)

## 2023-09-18 LAB — CBC WITH DIFFERENTIAL/PLATELET
Basophils Absolute: 0 10*3/uL (ref 0.0–0.1)
Basophils Relative: 0.4 % (ref 0.0–3.0)
Eosinophils Absolute: 0.1 10*3/uL (ref 0.0–0.7)
Eosinophils Relative: 2.6 % (ref 0.0–5.0)
HCT: 47 % (ref 39.0–52.0)
Hemoglobin: 15.9 g/dL (ref 13.0–17.0)
Lymphocytes Relative: 39.4 % (ref 12.0–46.0)
Lymphs Abs: 1.4 10*3/uL (ref 0.7–4.0)
MCHC: 33.9 g/dL (ref 30.0–36.0)
MCV: 86.9 fl (ref 78.0–100.0)
Monocytes Absolute: 0.4 10*3/uL (ref 0.1–1.0)
Monocytes Relative: 10.7 % (ref 3.0–12.0)
Neutro Abs: 1.6 10*3/uL (ref 1.4–7.7)
Neutrophils Relative %: 46.9 % (ref 43.0–77.0)
Platelets: 192 10*3/uL (ref 150.0–400.0)
RBC: 5.4 Mil/uL (ref 4.22–5.81)
RDW: 13.9 % (ref 11.5–15.5)
WBC: 3.5 10*3/uL — ABNORMAL LOW (ref 4.0–10.5)

## 2023-09-18 LAB — HEPATIC FUNCTION PANEL
ALT: 20 U/L (ref 0–53)
AST: 18 U/L (ref 0–37)
Albumin: 4.6 g/dL (ref 3.5–5.2)
Alkaline Phosphatase: 49 U/L (ref 39–117)
Bilirubin, Direct: 0.2 mg/dL (ref 0.0–0.3)
Total Bilirubin: 1.1 mg/dL (ref 0.2–1.2)
Total Protein: 6.8 g/dL (ref 6.0–8.3)

## 2023-09-18 LAB — HEMOGLOBIN A1C: Hgb A1c MFr Bld: 7.4 % — ABNORMAL HIGH (ref 4.6–6.5)

## 2023-09-18 MED ORDER — METOPROLOL SUCCINATE ER 100 MG PO TB24: 100.0000 mg | ORAL_TABLET | Freq: Every day | ORAL | 11 refills | Status: AC

## 2023-09-18 NOTE — Progress Notes (Signed)
 Subjective:    Patient ID: Jerry Crawford, male    DOB: 31-Dec-1960, 63 y.o.   MRN: [id number]  HPI Here for a well exam. He feels well in general, but he does mention some intermittent upper abdominal pains. He has taken Omeprazole  without much improvement. No nausea. Appetite is normal. He also asks a referral to a new vascular clinic, since his old one does not take his new insurance.    Review of Systems  Constitutional: Negative.   HENT: Negative.    Eyes: Negative.   Respiratory: Negative.    Cardiovascular: Negative.   Gastrointestinal:  Positive for abdominal pain.  Genitourinary: Negative.   Musculoskeletal: Negative.   Skin: Negative.   Neurological: Negative.   Psychiatric/Behavioral: Negative.         Objective:   Physical Exam Constitutional:      General: He is not in acute distress.    Appearance: Normal appearance. He is well-developed. He is not diaphoretic.  HENT:     Head: Normocephalic and atraumatic.     Right Ear: External ear normal.     Left Ear: External ear normal.     Nose: Nose normal.     Mouth/Throat:     Pharynx: No oropharyngeal exudate.  Eyes:     General: No scleral icterus.       Right eye: No discharge.        Left eye: No discharge.     Conjunctiva/sclera: Conjunctivae normal.     Pupils: Pupils are equal, round, and reactive to light.  Neck:     Thyroid : No thyromegaly.     Vascular: No JVD.     Trachea: No tracheal deviation.  Cardiovascular:     Rate and Rhythm: Normal rate and regular rhythm.     Pulses: Normal pulses.     Heart sounds: Normal heart sounds. No murmur heard.    No friction rub. No gallop.  Pulmonary:     Effort: Pulmonary effort is normal. No respiratory distress.     Breath sounds: Normal breath sounds. No wheezing or rales.  Chest:     Chest wall: No tenderness.  Abdominal:     General: Bowel sounds are normal. There is no distension.     Palpations: Abdomen is soft. There is no mass.      Tenderness: There is no abdominal tenderness. There is no guarding or rebound.  Genitourinary:    Penis: Normal. No tenderness.      Testes: Normal.     Prostate: Normal.     Rectum: Normal. Guaiac result negative.  Musculoskeletal:        General: No tenderness. Normal range of motion.     Cervical back: Neck supple.  Lymphadenopathy:     Cervical: No cervical adenopathy.  Skin:    General: Skin is warm and dry.     Coloration: Skin is not pale.     Findings: No erythema or rash.  Neurological:     General: No focal deficit present.     Mental Status: He is alert and oriented to person, place, and time.     Cranial Nerves: No cranial nerve deficit.     Motor: No abnormal muscle tone.     Coordination: Coordination normal.     Deep Tendon Reflexes: Reflexes are normal and symmetric. Reflexes normal.  Psychiatric:        Mood and Affect: Mood normal.        Behavior: Behavior normal.  Thought Content: Thought content normal.        Judgment: Judgment normal.           Assessment & Plan:  Well exam. We discussed diet and exercise. Get fasting labs. For the abdominal pain, we will refer him to GI. For the varicose veins, we will refer him to Vascular Surgery. Corita Diego, MD

## 2023-09-21 LAB — PSA: PSA: 0.5 ng/mL (ref 0.10–4.00)

## 2023-09-21 LAB — TSH: TSH: 2.07 u[IU]/mL (ref 0.35–5.50)

## 2023-09-22 ENCOUNTER — Ambulatory Visit: Payer: Self-pay | Admitting: Family Medicine

## 2023-09-23 DIAGNOSIS — H25013 Cortical age-related cataract, bilateral: Secondary | ICD-10-CM | POA: Diagnosis not present

## 2023-09-23 DIAGNOSIS — H524 Presbyopia: Secondary | ICD-10-CM | POA: Diagnosis not present

## 2023-09-26 ENCOUNTER — Telehealth: Payer: Self-pay | Admitting: *Deleted

## 2023-09-26 MED ORDER — METFORMIN HCL 500 MG PO TABS
500.0000 mg | ORAL_TABLET | Freq: Two times a day (BID) | ORAL | 3 refills | Status: AC
Start: 2023-09-26 — End: ?

## 2023-09-26 NOTE — Telephone Encounter (Signed)
 Patient requested information regarding diabetes.  Information was sent via mychart.

## 2023-10-12 NOTE — Progress Notes (Signed)
 [facility] Quality Team Note  Name: Jerry Crawford Date of Birth: [birth year] MRN: [id number] Date: 10/12/2023  [facility] Quality Team has reviewed this patient's chart, please see recommendations below:  [facility] Quality Other; (CHART REVIEWED. ABSTRACTED MOST RECENT BLOOD PRESSURE FOR GAP CLOSURE.)

## 2023-10-20 ENCOUNTER — Other Ambulatory Visit: Payer: Self-pay | Admitting: Family Medicine

## 2023-10-20 DIAGNOSIS — I10 Essential (primary) hypertension: Secondary | ICD-10-CM

## 2023-10-24 ENCOUNTER — Other Ambulatory Visit: Payer: Self-pay

## 2023-10-24 DIAGNOSIS — I83893 Varicose veins of bilateral lower extremities with other complications: Secondary | ICD-10-CM

## 2023-11-03 ENCOUNTER — Ambulatory Visit (HOSPITAL_COMMUNITY)
Admission: RE | Admit: 2023-11-03 | Discharge: 2023-11-03 | Disposition: A | Discharge status: Home / Self Care | Source: Ambulatory Visit | Attending: Vascular Surgery | Admitting: Vascular Surgery

## 2023-11-03 DIAGNOSIS — I83893 Varicose veins of bilateral lower extremities with other complications: Secondary | ICD-10-CM

## 2023-11-09 ENCOUNTER — Telehealth: Payer: Self-pay | Admitting: *Deleted

## 2023-11-09 NOTE — Telephone Encounter (Signed)
 Copied from CRM [id number]. Topic: Referral - Request for Referral >> Nov 09, 2023 12:51 PM [place] J wrote: Did the patient discuss referral with their provider in the last year? Yes (If No - schedule appointment) (If Yes - send message)  Appointment offered? Yes  Type of order/referral and detailed reason for visit: Gastroenterology placed on 06/09 but patient was unable to get to him phone when the GI clinic tried to call him. The only time the patient is available is on lunch at 12:30 PM or after work at 5 PM.  Preference of office, provider, location: No preference  If referral order, have you been seen by this specialty before? No (If Yes, this issue or another issue? When? Where?  Can we respond through MyChart? Yes

## 2023-11-10 NOTE — Telephone Encounter (Signed)
 Left a detailed message at the patient's cell number to call [facility] at [phone] to schedule his own appt at a convenient time.

## 2023-11-15 ENCOUNTER — Ambulatory Visit: Admitting: Family Medicine

## 2023-11-15 ENCOUNTER — Encounter: Payer: Self-pay | Admitting: Family Medicine

## 2023-11-15 VITALS — BP 138/70 | HR 73 | Temp 98.1°F | Wt 218.4 lb

## 2023-11-15 DIAGNOSIS — H6591 Unspecified nonsuppurative otitis media, right ear: Secondary | ICD-10-CM

## 2023-11-15 NOTE — Progress Notes (Unsigned)
 Patient ID: Jerry Crawford, male   DOB: 27-Jan-1961, 63 y.o.   MRN: [id number]  Reason for Consult: No chief complaint on file.   Referred by Jerry Crawford LABOR, MD  Subjective:     HPI  Jerry Crawford is a 63 y.o. male who presents for evaluation of lower extremity varicosities Timeframe: Many years.  Has had previous ablations, right leg in 2019 by Dr. Gerlean Symptoms: Aching and burning in varicosities Varicosities: Yes large and medial thigh and popliteal region Previous wounds: No Previous DVT: No In compression: No  Past Medical History:  Diagnosis Date   Allergy    Hypercholesteremia    Hyperlipidemia    Hypertension    Family History  Problem Relation Age of Onset   Asthma Mother    Hypertension Mother    Healthy Father    Asthma Sister    Hypertension Sister    Asthma Brother    Colon cancer Neg Hx    Colon polyps Neg Hx    Rectal cancer Neg Hx    Stomach cancer Neg Hx    Esophageal cancer Neg Hx    Past Surgical History:  Procedure Laterality Date   COLONOSCOPY  11/28/2022   per Dr. Aneita, no polyps, repeat in 10 yrs   ENDOVENOUS ABLATION SAPHENOUS VEIN W/ LASER Left 11/08/2016   endovenous laser ablation left small saphenous vein by Lynwood Gerlean MD    ENDOVENOUS ABLATION SAPHENOUS VEIN W/ LASER Left 11/23/2016   endovenous laser ablation left greater saphenous vein and stab phlebectomy > 20 incisions left leg by Lynwood Gerlean MD   ENDOVENOUS ABLATION SAPHENOUS VEIN W/ LASER Right 12/26/2016   endovenous laser ablation R greater saphenous vein and stab phlebectomy > 20 incisions R leg by Lynwood Gerlean MD    ENDOVENOUS ABLATION SAPHENOUS VEIN W/ LASER Right 2024   per [facility]    Short Social History:  Social History   Tobacco Use   Smoking status: Never   Smokeless tobacco: Never  Substance Use Topics   Alcohol use: Yes    Comment: Rarely <1/month    No Known Allergies  Current Outpatient Medications  Medication Sig  Dispense Refill   azelastine  (OPTIVAR ) 0.05 % ophthalmic solution INSTILL 1 DROP IN BOTH EYES TWICE DAILY 6 mL 11   clobetasol  ointment (TEMOVATE ) 0.05 % Apply 1 Application topically 2 (two) times daily. Apply to hands 2 times a day for 3 weeks alternate with Tacrolimus  every 3 weeks. 60 g 2   desonide  (DESOWEN ) 0.05 % lotion Apply topically 2 (two) times daily. 59 mL 11   ipratropium (ATROVENT ) 0.03 % nasal spray Place 2 sprays into both nostrils every 12 (twelve) hours. 30 mL 12   metFORMIN  (GLUCOPHAGE ) 500 MG tablet Take 1 tablet (500 mg total) by mouth 2 (two) times daily with a meal. 180 tablet 3   metoprolol  succinate (TOPROL -XL) 100 MG 24 hr tablet Take 1 tablet (100 mg total) by mouth daily. Take with or immediately following a meal. 30 tablet 11   Multiple Vitamin (MULTIVITAMIN WITH MINERALS) TABS Take 1 tablet by mouth daily.     ondansetron  (ZOFRAN -ODT) 4 MG disintegrating tablet Take 1 tablet (4 mg total) by mouth every 8 (eight) hours as needed for nausea or vomiting. 30 tablet 0   polyethylene glycol powder (GLYCOLAX/MIRALAX) 17 GM/SCOOP powder Take 17 g by mouth as needed.     simvastatin  (ZOCOR ) 20 MG tablet Take 1 tablet (20 mg total) by mouth every  evening. 90 tablet 3   tacrolimus  (PROTOPIC ) 0.1 % ointment Apply to hands twice daily for 3 weeks alternate with clobetasol  as needed every 3 weeks. 60 g 0   Tetrahydrozoline HCl (EYE DROPS OP) Apply 2 drops to eye as needed (for redness in eyes).     triamcinolone  cream (KENALOG ) 0.1 % Apply 1 application topically 2 (two) times daily. 45 g 2   triamterene -hydrochlorothiazide (MAXZIDE-25) 37.5-25 MG tablet TAKE 1 TABLET BY MOUTH DAILY 90 tablet 3   Current Facility-Administered Medications  Medication Dose Route Frequency Provider Last Rate Last Admin   0.9 %  sodium chloride  infusion  500 mL Intravenous Once Aneita Gwendlyn DASEN, MD        REVIEW OF SYSTEMS All other systems were reviewed and are negative    Objective:   Objective   There were no vitals filed for this visit. There is no height or weight on file to calculate BMI.  Physical Exam General: no acute distress Cardiac: hemodynamically stable Pulm: normal work of breathing Neuro: alert, no focal deficit Extremities: Bilateral large, engorged ropey varicosities in the medial thighs, right greater than left.  Some clusters of telangiectasias across the medial aspect of the shins. Vascular:   Right: palpable DP, PT  Left: palpable DP, PT   Data: Reflux study Venous Reflux Times  +-------------------+---------+------+----------+------------+-------------  ----+  RIGHT             Reflux NoReflux  Reflux  Diameter cmsComments                                         Yes     Time                                   +-------------------+---------+------+----------+------------+-------------  ----+  CFV                         yes  >1 second                                 +-------------------+---------+------+----------+------------+-------------  ----+  Popliteal         no                                                       +-------------------+---------+------+----------+------------+-------------  ----+  GSV at SFJ         no                           .80                         +-------------------+---------+------+----------+------------+-------------  ----+  GSV prox thigh               yes                .69     prior  ablation/strippin                                                         g,  recanalized     +-------------------+---------+------+----------+------------+-------------  ----+  GSV mid thigh                yes                .57     prior                                                                        ablation/strippin                                                          g,  recanalized     +-------------------+---------+------+----------+------------+-------------  ----+  GSV dist thigh                                          NWV                 +-------------------+---------+------+----------+------------+-------------  ----+  GSV at knee                  yes   >500 ms      .35                         +-------------------+---------+------+----------+------------+-------------  ----+  GSV prox calf      no                           .36                         +-------------------+---------+------+----------+------------+-------------  ----+  GSV mid calf                 yes   >500 ms      .27                         +-------------------+---------+------+----------+------------+-------------  ----+  GSV dist calf      no                           .26                         +-------------------+---------+------+----------+------------+-------------  ----+  [place]                   yes   >500 ms      .25                         +-------------------+---------+------+----------+------------+-------------  ----+  SSV prox calf      no                           .41                         +-------------------+---------+------+----------+------------+-------------  ----+  SSV mid calf       no                           .29                         +-------------------+---------+------+----------+------------+-------------  ----+  Mid Thigh                    yes   >500 ms      .66                         Varicosity                                                                  +-------------------+---------+------+----------+------------+-------------  ----+  Distal Calf                  yes   >500 ms      .52                         Varicosity                                                                   +-------------------+---------+------+----------+------------+-------------  ----+       Assessment/Plan:     Geovanni C Campus is a 63 y.o. male with chronic venous insufficiency with C2s disease and reflux noted in right GSV with recanalization I explained the foundation of CVI treatment of compression and elevation I recommended medical grade graduated compression stockings and intermittent leg elevation We also discussed the importance of exercise for symptom management as well.  We discussed that since she has had recanalization I do think it is reasonable to discuss a possible redo intervention but that our follow-up time for venous reflux is greater than 3 months at this time.  Plan to follow up with Dr. Serene or Dr. Sheree with a left reflux study in order to complete bilateral imaging      Norman GORMAN Serve MD [facility]

## 2023-11-15 NOTE — Progress Notes (Signed)
 Established Patient Office Visit  Subjective   Patient ID: Jerry Crawford, male    DOB: 08/19/1960  Age: 63 y.o. MRN: [id number]  Chief Complaint  Patient presents with   Ear Pain    HPI   Mr Novick is seen today with some fullness in the right ear and some pain for 1 day duration.  He states that when he yawns he notes a squishing sound.  No drainage.  No recent fevers or chills.  Does have occasional allergies but no active nasal congestion.  No sinusitis symptoms.  No recent flying or swimming.  No acute hearing changes.  No tinnitus.  No vertigo.  Past Medical History:  Diagnosis Date   Allergy    Hypercholesteremia    Hyperlipidemia    Hypertension    Past Surgical History:  Procedure Laterality Date   COLONOSCOPY  11/28/2022   per Dr. Aneita, no polyps, repeat in 10 yrs   ENDOVENOUS ABLATION SAPHENOUS VEIN W/ LASER Left 11/08/2016   endovenous laser ablation left small saphenous vein by Lynwood Collum MD    ENDOVENOUS ABLATION SAPHENOUS VEIN W/ LASER Left 11/23/2016   endovenous laser ablation left greater saphenous vein and stab phlebectomy > 20 incisions left leg by Lynwood Collum MD   ENDOVENOUS ABLATION SAPHENOUS VEIN W/ LASER Right 12/26/2016   endovenous laser ablation R greater saphenous vein and stab phlebectomy > 20 incisions R leg by Lynwood Collum MD    ENDOVENOUS ABLATION SAPHENOUS VEIN W/ LASER Right 2024   per [facility]    reports that he has never smoked. He has never used smokeless tobacco. He reports current alcohol use. He reports that he does not use drugs. family history includes Asthma in his brother, mother, and sister; Healthy in his father; Hypertension in his mother and sister. No Known Allergies  Review of Systems  Constitutional:  Negative for chills and fever.  HENT:  Positive for ear pain. Negative for ear discharge, hearing loss, sinus pain, sore throat and tinnitus.       Objective:     BP 138/70   Pulse 73   Temp  98.1 F (36.7 C) (Oral)   Wt 218 lb 6.4 oz (99.1 kg)   SpO2 97%   BMI 30.46 kg/m    Physical Exam Vitals reviewed.  Constitutional:      General: He is not in acute distress.    Appearance: Normal appearance. He is not ill-appearing.  HENT:     Ears:     Comments: Left canal reveals only minimal cerumen.  Eardrum appears normal.  Right canal minimal cerumen.  Not obstructing.  Eardrum reveals no erythema and no suppurative changes.  Does appear to have some effusion.  Eardrum intact with no visible perforation.  No visible foreign bodies in the ear canal.  No evidence for otitis externa Cardiovascular:     Rate and Rhythm: Normal rate and regular rhythm.  Neurological:     Mental Status: He is alert.      No results found for any visits on 11/15/23.    The ASCVD Risk score (Arnett DK, et al., 2019) failed to calculate for the following reasons:   Unable to determine if patient is Non-Hispanic African American    Assessment & Plan:   Right otitis media with effusion.  He does have some seasonal allergies.  No evidence or suppurative changes.  No otitis externa.  Minimal cerumen.  Recommend daily Allegra and also start Flonase nasal 2  sprays each nostril once daily.  Follow-up with primary if symptoms not resolving in the next several weeks   Wolm Scarlet, MD

## 2023-11-15 NOTE — Patient Instructions (Signed)
 Consider daily Allegra and Flonase/Fluticasone

## 2023-11-17 ENCOUNTER — Ambulatory Visit: Attending: Vascular Surgery | Admitting: Vascular Surgery

## 2023-11-17 ENCOUNTER — Encounter: Payer: Self-pay | Admitting: Vascular Surgery

## 2023-11-17 VITALS — BP 131/84 | HR 74 | Temp 98.1°F | Resp 16 | Ht 71.0 in | Wt 217.4 lb

## 2023-11-17 DIAGNOSIS — I872 Venous insufficiency (chronic) (peripheral): Secondary | ICD-10-CM

## 2023-11-17 DIAGNOSIS — I8393 Asymptomatic varicose veins of bilateral lower extremities: Secondary | ICD-10-CM

## 2023-12-14 NOTE — Progress Notes (Unsigned)
 Chief Complaint:Upper abdominal pain  Primary GI Doctor: (previously Dr. Aneita)  HPI:  Patient is a  63  year old male patient with past medical history of hypertension, chronic venous insufficiency hyperlipidemia, who was referred to me by Jerry Garnette LABOR, MD on 09/18/23 for a evaluation of Upper abdominal pain  .    Interval History  Patient admits/denies GERD Patient admits/denies dysphagia Patient admits/denies nausea, vomiting, or weight loss  Patient admits/denies altered bowel habits Patient admits/denies abdominal pain Patient admits/denies rectal bleeding   Denies/Admits alcohol Denies/Admits smoking Denies/Admits NSAID use. Denies/Admits they are on blood thinners.  Surgical history:  Patient's family history includes  Wt Readings from Last 3 Encounters:  11/17/23 217 lb 6.4 oz (98.6 kg)  11/15/23 218 lb 6.4 oz (99.1 kg)  09/18/23 223 lb (101.2 kg)    GI procedures Colonoscopy 11/28/2022, recall 10 years - Internal hemorrhoids. - The examination was otherwise normal on direct and retroflexion views. - No specimens collected.  Colonoscopy 06/02/2017 - One 5 mm polyp in the ascending colon, removed with a cold biopsy forceps. Resected and retrieved. - Internal hemorrhoids. - The examination was otherwise normal on direct and retroflexion views. Path:  Surgical [P], ascending, polyp - TUBULAR ADENOMA - MULTIPLE STEP SECTIONS WERE EXAMINED   Past Medical History:  Diagnosis Date   Allergy    Hypercholesteremia    Hyperlipidemia    Hypertension    Peripheral vascular disease (HCC)     Past Surgical History:  Procedure Laterality Date   COLONOSCOPY  11/28/2022   per Dr. Aneita, no polyps, repeat in 10 yrs   ENDOVENOUS ABLATION SAPHENOUS VEIN W/ LASER Left 11/08/2016   endovenous laser ablation left small saphenous vein by Lynwood Collum MD    ENDOVENOUS ABLATION SAPHENOUS VEIN W/ LASER Left 11/23/2016   endovenous laser ablation left greater saphenous vein  and stab phlebectomy > 20 incisions left leg by Lynwood Collum MD   ENDOVENOUS ABLATION SAPHENOUS VEIN W/ LASER Right 12/26/2016   endovenous laser ablation R greater saphenous vein and stab phlebectomy > 20 incisions R leg by Lynwood Collum MD    ENDOVENOUS ABLATION SAPHENOUS VEIN W/ LASER Right 2024   per [facility]    Current Outpatient Medications  Medication Sig Dispense Refill   azelastine  (OPTIVAR ) 0.05 % ophthalmic solution INSTILL 1 DROP IN BOTH EYES TWICE DAILY 6 mL 11   clobetasol  ointment (TEMOVATE ) 0.05 % Apply 1 Application topically 2 (two) times daily. Apply to hands 2 times a day for 3 weeks alternate with Tacrolimus  every 3 weeks. 60 g 2   desonide  (DESOWEN ) 0.05 % lotion Apply topically 2 (two) times daily. 59 mL 11   ipratropium (ATROVENT ) 0.03 % nasal spray Place 2 sprays into both nostrils every 12 (twelve) hours. 30 mL 12   metFORMIN  (GLUCOPHAGE ) 500 MG tablet Take 1 tablet (500 mg total) by mouth 2 (two) times daily with a meal. 180 tablet 3   metoprolol  succinate (TOPROL -XL) 100 MG 24 hr tablet Take 1 tablet (100 mg total) by mouth daily. Take with or immediately following a meal. 30 tablet 11   Multiple Vitamin (MULTIVITAMIN WITH MINERALS) TABS Take 1 tablet by mouth daily.     ondansetron  (ZOFRAN -ODT) 4 MG disintegrating tablet Take 1 tablet (4 mg total) by mouth every 8 (eight) hours as needed for nausea or vomiting. 30 tablet 0   polyethylene glycol powder (GLYCOLAX/MIRALAX) 17 GM/SCOOP powder Take 17 g by mouth as needed.  simvastatin  (ZOCOR ) 20 MG tablet Take 1 tablet (20 mg total) by mouth every evening. 90 tablet 3   tacrolimus  (PROTOPIC ) 0.1 % ointment Apply to hands twice daily for 3 weeks alternate with clobetasol  as needed every 3 weeks. 60 g 0   Tetrahydrozoline HCl (EYE DROPS OP) Apply 2 drops to eye as needed (for redness in eyes).     triamcinolone  cream (KENALOG ) 0.1 % Apply 1 application topically 2 (two) times daily. 45 g 2    triamterene -hydrochlorothiazide (MAXZIDE-25) 37.5-25 MG tablet TAKE 1 TABLET BY MOUTH DAILY 90 tablet 3   Current Facility-Administered Medications  Medication Dose Route Frequency Provider Last Rate Last Admin   0.9 %  sodium chloride  infusion  500 mL Intravenous Once Aneita Gwendlyn DASEN, MD        Allergies as of 12/15/2023   (No Known Allergies)    Family History  Problem Relation Age of Onset   Asthma Mother    Hypertension Mother    Healthy Father    Asthma Sister    Hypertension Sister    Asthma Brother    Colon cancer Neg Hx    Colon polyps Neg Hx    Rectal cancer Neg Hx    Stomach cancer Neg Hx    Esophageal cancer Neg Hx     Review of Systems:    Constitutional: No weight loss, fever, chills, weakness or fatigue HEENT: Eyes: No change in vision               Ears, Nose, Throat:  No change in hearing or congestion Skin: No rash or itching Cardiovascular: No chest pain, chest pressure or palpitations   Respiratory: No SOB or cough Gastrointestinal: See HPI and otherwise negative Genitourinary: No dysuria or change in urinary frequency Neurological: No headache, dizziness or syncope Musculoskeletal: No new muscle or joint pain Hematologic: No bleeding or bruising Psychiatric: No history of depression or anxiety    Physical Exam:  Vital signs: There were no vitals taken for this visit.  Constitutional:   Pleasant *** male/male appears to be in NAD, Well developed, Well nourished, alert and cooperative Eyes:   PEERL, EOMI. No icterus. Conjunctiva pink. Neck:  Supple Throat: Oral cavity and pharynx without inflammation, swelling or lesion.  Respiratory: Respirations even and unlabored. Lungs clear to auscultation bilaterally.   No wheezes, crackles, or rhonchi.  Cardiovascular: Normal S1, S2. Regular rate and rhythm. No peripheral edema, cyanosis or pallor.  Gastrointestinal:  Soft, nondistended, nontender. No rebound or guarding. Normal bowel sounds. No  appreciable masses or hepatomegaly. Rectal:  Not performed.  Anoscopy: Msk:  Symmetrical without gross deformities. Without edema, no deformity or joint abnormality.  Neurologic:  Alert and  oriented x4;  grossly normal neurologically.  Skin:   Dry and intact without significant lesions or rashes.  RELEVANT LABS AND IMAGING: CBC    Latest Ref Rng & Units 09/18/2023    8:39 AM 03/29/2023    7:43 AM 09/15/2022   10:45 AM  CBC  WBC 4.0 - 10.5 K/uL 3.5  3.3  4.0   Hemoglobin 13.0 - 17.0 g/dL 84.0  83.4  83.2   Hematocrit 39.0 - 52.0 % 47.0  49.9  50.0   Platelets 150.0 - 400.0 K/uL 192.0  197  226.0      CMP     Latest Ref Rng & Units 09/18/2023    8:39 AM 09/15/2022   10:45 AM 07/08/2021   10:20 AM  CMP  Glucose 70 - 99  mg/dL 855  873  879   BUN 6 - 23 mg/dL 16  16  21    Creatinine 0.40 - 1.50 mg/dL 9.13  9.00  8.96   Sodium 135 - 145 mEq/L 138  137  139   Potassium 3.5 - 5.1 mEq/L 4.0  4.4  4.3   Chloride 96 - 112 mEq/L 102  98  100   CO2 19 - 32 mEq/L 31  30  30    Calcium 8.4 - 10.5 mg/dL 9.5  9.8  89.9   Total Protein 6.0 - 8.3 g/dL 6.8  7.2  7.1   Total Bilirubin 0.2 - 1.2 mg/dL 1.1  1.6  1.5   Alkaline Phos 39 - 117 U/L 49  57  51   AST 0 - 37 U/L 18  23  21    ALT 0 - 53 U/L 20  25  26       Lab Results  Component Value Date   TSH 2.07 09/18/2023     Assessment: 1. ***  Plan: 1. ***   Thank you for the courtesy of this consult. Please call me with any questions or concerns.   Sakeena Teall, FNP-C [facility] 12/14/2023, 4:28 PM  Cc: Jerry Garnette LABOR, MD

## 2023-12-15 ENCOUNTER — Encounter: Payer: Self-pay | Admitting: Gastroenterology

## 2023-12-15 ENCOUNTER — Ambulatory Visit: Admitting: Gastroenterology

## 2023-12-15 VITALS — BP 130/90 | HR 75 | Ht 71.0 in | Wt 213.1 lb

## 2023-12-15 DIAGNOSIS — Z860101 Personal history of adenomatous and serrated colon polyps: Secondary | ICD-10-CM | POA: Diagnosis not present

## 2023-12-15 DIAGNOSIS — K219 Gastro-esophageal reflux disease without esophagitis: Secondary | ICD-10-CM | POA: Diagnosis not present

## 2023-12-15 DIAGNOSIS — R1013 Epigastric pain: Secondary | ICD-10-CM

## 2023-12-15 NOTE — Patient Instructions (Addendum)
 GERD Recommend GERD diet Can use Pepcid  20mg  prn as needed once to twice daily   Recommend discussing with PCP, may consider physical therapy  Upon examination noted Diastasis recti- weakened area in abdominal muscle.   Your provider has ordered [employer] stool testing for you. You have received a kit from our office today containing all necessary supplies to complete this test. Please carefully read the stool collection instructions provided in the kit before opening the accompanying materials. In addition, be sure there is a label providing your full name and date of birth on the puritan opti-swab tube that is supplied in the kit (if you do not see a label with this information on your test tube, please make us  aware before test collection!). After completing the test, you should secure the purtian tube into the specimen biohazard bag. The [facility] E-Req sheet (including date and time of specimen collection) should be placed into the outside pocket of the specimen biohazard bag and returned to the [place] lab (basement floor of [facility]) within 3 days of collection. Please make sure to give the specimen to a staff member at the lab. DO NOT leave the specimen on the counter.   If the specimen date and time (can be found in the upper right boxed portion of the sheet) are not filled out on the E-Req sheet, the test will NOT be performed.   You have been scheduled for an endoscopy. Please follow written instructions given to you at your visit today.  If you use inhalers (even only as needed), please bring them with you on the day of your procedure.  If you take any of the following medications, they will need to be adjusted prior to your procedure:   DO NOT TAKE 7 DAYS PRIOR TO TEST- Trulicity (dulaglutide) Ozempic, Wegovy (semaglutide) Mounjaro (tirzepatide) Bydureon Bcise (exanatide extended release)  DO NOT TAKE 1 DAY PRIOR TO YOUR TEST Rybelsus  (semaglutide) Adlyxin (lixisenatide) Victoza (liraglutide) Byetta (exanatide) ___________________________________________________________________________   Due to recent changes in healthcare laws, you may see the results of your imaging and laboratory studies on MyChart before your provider has had a chance to review them.  We understand that in some cases there may be results that are confusing or concerning to you. Not all laboratory results come back in the same time frame and the provider may be waiting for multiple results in order to interpret others.  Please give us  48 hours in order for your provider to thoroughly review all the results before contacting the office for clarification of your results.   _______________________________________________________  If your blood pressure at your visit was 140/90 or greater, please contact your primary care physician to follow up on this.  _______________________________________________________  If you are age 52 or older, your body mass index should be between 23-30. Your Body mass index is 29.72 kg/m. If this is out of the aforementioned range listed, please consider follow up with your Primary Care Provider.  If you are age 56 or younger, your body mass index should be between 19-25. Your Body mass index is 29.72 kg/m. If this is out of the aformentioned range listed, please consider follow up with your Primary Care Provider.   ________________________________________________________  The [facility] providers would like to encourage you to use MYCHART to communicate with providers for non-urgent requests or questions.  Due to long hold times on the telephone, sending your provider a message by [facility] may be a faster and  more efficient way to get a response.  Please allow 48 business hours for a response.  Please remember that this is for non-urgent requests.  _______________________________________________________  [facility] is using a team-based approach to care.  Your team is made up of your doctor and two to three APPS. Our APPS (Nurse Practitioners and Physician Assistants) work with your physician to ensure care continuity for you. They are fully qualified to address your health concerns and develop a treatment plan. They communicate directly with your gastroenterologist to care for you. Seeing the Advanced Practice Practitioners on your physician's team can help you by facilitating care more promptly, often allowing for earlier appointments, access to diagnostic testing, procedures, and other specialty referrals.   Thank you for trusting me with your gastrointestinal care. Deanna May, FNP-C

## 2023-12-18 DIAGNOSIS — K219 Gastro-esophageal reflux disease without esophagitis: Secondary | ICD-10-CM | POA: Diagnosis not present

## 2023-12-18 DIAGNOSIS — R1013 Epigastric pain: Secondary | ICD-10-CM | POA: Diagnosis not present

## 2024-01-01 ENCOUNTER — Encounter: Payer: Self-pay | Admitting: Family Medicine

## 2024-01-01 ENCOUNTER — Ambulatory Visit: Admitting: Family Medicine

## 2024-01-01 ENCOUNTER — Ambulatory Visit: Payer: Self-pay | Admitting: Family Medicine

## 2024-01-01 VITALS — BP 118/78 | HR 60 | Temp 97.8°F | Ht 71.0 in | Wt 216.0 lb

## 2024-01-01 DIAGNOSIS — Z0000 Encounter for general adult medical examination without abnormal findings: Secondary | ICD-10-CM

## 2024-01-01 LAB — CBC WITH DIFFERENTIAL/PLATELET
Basophils Absolute: 0 K/uL (ref 0.0–0.1)
Basophils Relative: 0.7 % (ref 0.0–3.0)
Eosinophils Absolute: 0.1 K/uL (ref 0.0–0.7)
Eosinophils Relative: 3.3 % (ref 0.0–5.0)
HCT: 49.6 % (ref 39.0–52.0)
Hemoglobin: 16.6 g/dL (ref 13.0–17.0)
Lymphocytes Relative: 42.4 % (ref 12.0–46.0)
Lymphs Abs: 1.6 K/uL (ref 0.7–4.0)
MCHC: 33.5 g/dL (ref 30.0–36.0)
MCV: 88 fl (ref 78.0–100.0)
Monocytes Absolute: 0.5 K/uL (ref 0.1–1.0)
Monocytes Relative: 12.6 % — ABNORMAL HIGH (ref 3.0–12.0)
Neutro Abs: 1.5 K/uL (ref 1.4–7.7)
Neutrophils Relative %: 41 % — ABNORMAL LOW (ref 43.0–77.0)
Platelets: 207 K/uL (ref 150.0–400.0)
RBC: 5.63 Mil/uL (ref 4.22–5.81)
RDW: 13.5 % (ref 11.5–15.5)
WBC: 3.7 K/uL — ABNORMAL LOW (ref 4.0–10.5)

## 2024-01-01 LAB — LIPID PANEL
Cholesterol: 118 mg/dL (ref 0–200)
HDL: 47.4 mg/dL (ref >39.00)
LDL Cholesterol: 51 mg/dL (ref 0–99)
NonHDL: 71.08
Total CHOL/HDL Ratio: 2
Triglycerides: 99 mg/dL (ref 0.0–149.0)
VLDL: 19.8 mg/dL (ref 0.0–40.0)

## 2024-01-01 LAB — HEPATIC FUNCTION PANEL
ALT: 19 U/L (ref 0–53)
AST: 17 U/L (ref 0–37)
Albumin: 4.7 g/dL (ref 3.5–5.2)
Alkaline Phosphatase: 44 U/L (ref 39–117)
Bilirubin, Direct: 0.3 mg/dL (ref 0.0–0.3)
Total Bilirubin: 1.6 mg/dL — ABNORMAL HIGH (ref 0.2–1.2)
Total Protein: 7 g/dL (ref 6.0–8.3)

## 2024-01-01 LAB — BASIC METABOLIC PANEL WITH GFR
BUN: 16 mg/dL (ref 6–23)
CO2: 32 meq/L (ref 19–32)
Calcium: 10 mg/dL (ref 8.4–10.5)
Chloride: 98 meq/L (ref 96–112)
Creatinine, Ser: 1.05 mg/dL (ref 0.40–1.50)
GFR: 76.8 mL/min (ref >60.00)
Glucose, Bld: 123 mg/dL — ABNORMAL HIGH (ref 70–99)
Potassium: 4 meq/L (ref 3.5–5.1)
Sodium: 136 meq/L (ref 135–145)

## 2024-01-01 LAB — HEMOGLOBIN A1C: Hgb A1c MFr Bld: 7 % — ABNORMAL HIGH (ref 4.6–6.5)

## 2024-01-01 LAB — TSH: TSH: 1.9 u[IU]/mL (ref 0.35–5.50)

## 2024-01-01 LAB — PSA: PSA: 0.46 ng/mL (ref 0.10–4.00)

## 2024-01-01 NOTE — Progress Notes (Signed)
 Subjective:    Patient ID: Jerry Crawford, male    DOB: 06-Jan-1961, 63 y.o.   MRN: [id number]  HPI Here for a well exam. He feels well in general, though he has some intermittent epigastric pains. He is scheduled for an EGD per Dr. San on 02-16-24.    Review of Systems  Constitutional: Negative.   HENT: Negative.    Eyes: Negative.   Respiratory: Negative.    Cardiovascular: Negative.   Gastrointestinal:  Positive for abdominal pain.  Genitourinary: Negative.   Musculoskeletal: Negative.   Skin: Negative.   Neurological: Negative.   Psychiatric/Behavioral: Negative.         Objective:   Physical Exam Constitutional:      General: He is not in acute distress.    Appearance: Normal appearance. He is well-developed. He is not diaphoretic.  HENT:     Head: Normocephalic and atraumatic.     Right Ear: External ear normal.     Left Ear: External ear normal.     Nose: Nose normal.     Mouth/Throat:     Pharynx: No oropharyngeal exudate.  Eyes:     General: No scleral icterus.       Right eye: No discharge.        Left eye: No discharge.     Conjunctiva/sclera: Conjunctivae normal.     Pupils: Pupils are equal, round, and reactive to light.  Neck:     Thyroid : No thyromegaly.     Vascular: No JVD.     Trachea: No tracheal deviation.  Cardiovascular:     Rate and Rhythm: Normal rate and regular rhythm.     Pulses: Normal pulses.     Heart sounds: Normal heart sounds. No murmur heard.    No friction rub. No gallop.  Pulmonary:     Effort: Pulmonary effort is normal. No respiratory distress.     Breath sounds: Normal breath sounds. No wheezing or rales.  Chest:     Chest wall: No tenderness.  Abdominal:     General: Bowel sounds are normal. There is no distension.     Palpations: Abdomen is soft. There is no mass.     Tenderness: There is no abdominal tenderness. There is no guarding or rebound.  Genitourinary:    Penis: Normal. No tenderness.       Testes: Normal.     Prostate: Normal.     Rectum: Normal. Guaiac result negative.  Musculoskeletal:        General: No tenderness. Normal range of motion.     Cervical back: Neck supple.  Lymphadenopathy:     Cervical: No cervical adenopathy.  Skin:    General: Skin is warm and dry.     Coloration: Skin is not pale.     Findings: No erythema or rash.  Neurological:     General: No focal deficit present.     Mental Status: He is alert and oriented to person, place, and time.     Cranial Nerves: No cranial nerve deficit.     Motor: No abnormal muscle tone.     Coordination: Coordination normal.     Deep Tendon Reflexes: Reflexes are normal and symmetric. Reflexes normal.  Psychiatric:        Mood and Affect: Mood normal.        Behavior: Behavior normal.        Thought Content: Thought content normal.        Judgment: Judgment normal.  Assessment & Plan:  Well exam. We discussed diet and exercise. Get fasting labs. Garnette Olmsted, MD

## 2024-01-02 IMAGING — CT CT CARDIAC CORONARY ARTERY CALCIUM SCORE
3 series · 14 of 20 positions shown, 16 images · non-contrast
Comparison: None.
COMPARISON: None.

Addendum:
EXAM:
OVER-READ INTERPRETATION  CT CHEST

The following report is an over-read performed by radiologist Dr.
Nayely Mahdavi [REDACTED] on 05/18/2021. This
over-read does not include interpretation of cardiac or coronary
anatomy or pathology. The coronary calcium score interpretation by
the cardiologist is attached.
CLINICAL DATA: Risk stratification
Coronary Calcium Score
TECHNIQUE: The patient was scanned on a Siemens Somatom 64 slice scanner. Axial
non-contrast 3 mm slices were carried out through the heart. The
data set was analyzed on a dedicated work station and scored using
the Agatson method.

[Series 2: cascseq 2.0 sa36 70% (id) · axial · 0.40mm/px · z∈[-249,-169]mm · 4 of 68 slices shown]
[im 14/68  vessel]
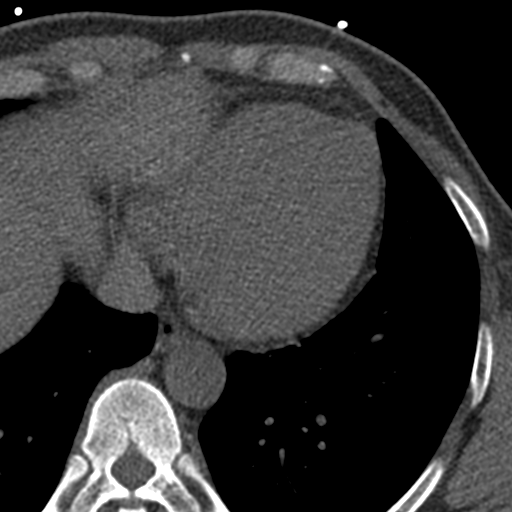
[im 27/68  vessel]
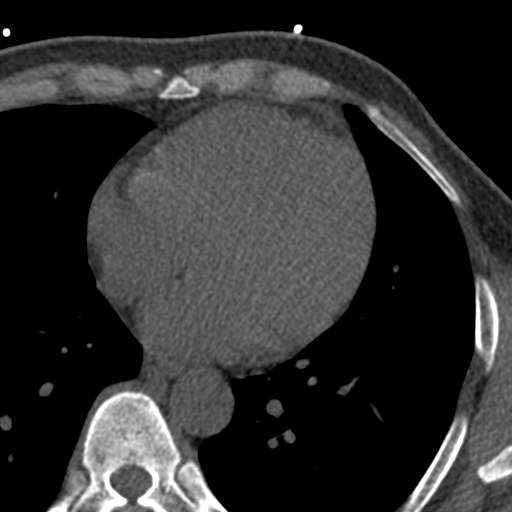
[im 41/68  vessel]
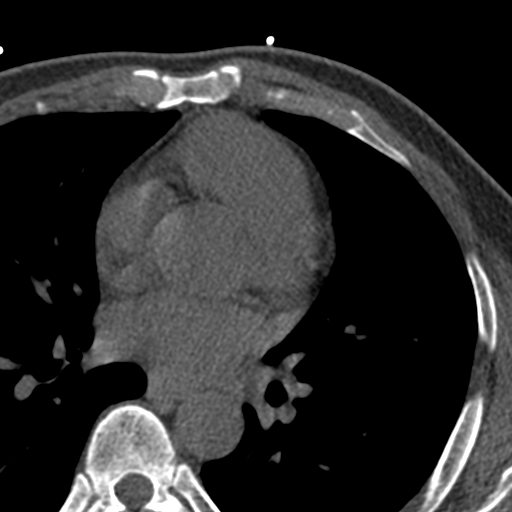
[im 54/68  vessel]
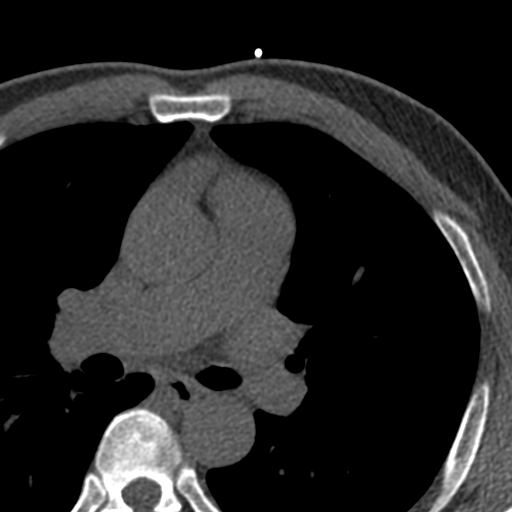

[Series 3: cascseq 2.0 bf37 st · axial · 0.73mm/px · z∈[-253,-165]mm · 5 of 68 slices shown, 7 images]
[im 12/68  vessel]
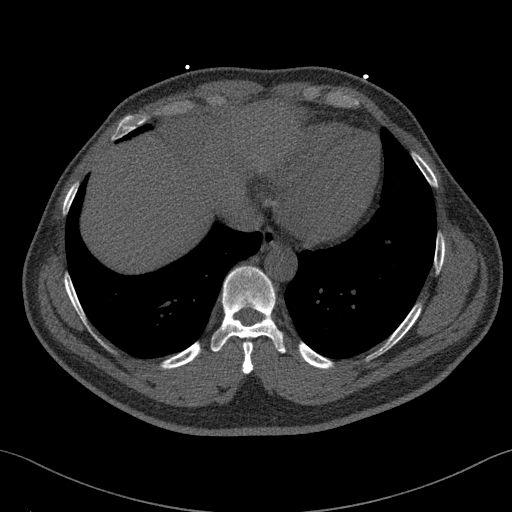
[im 12/68  lung]
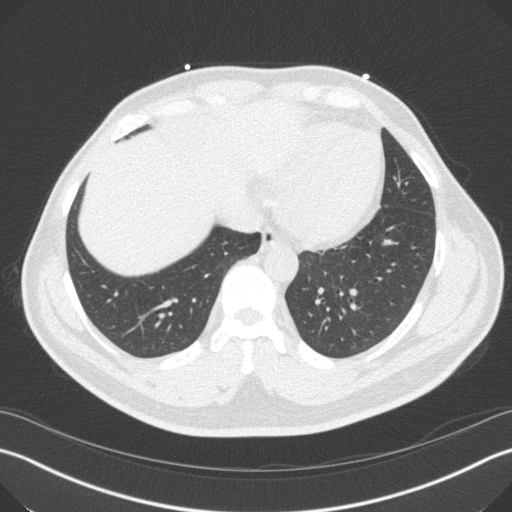
[im 23/68  vessel]
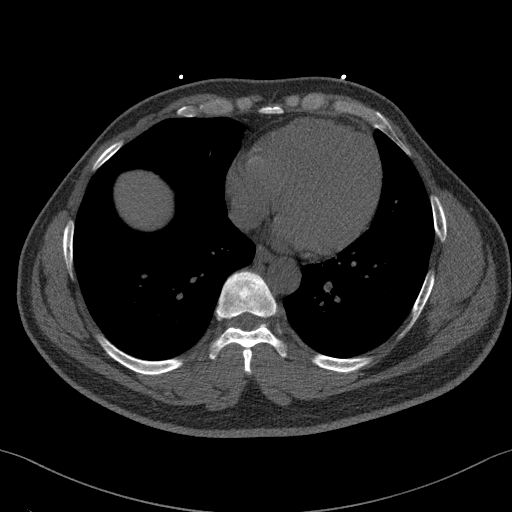
[im 34/68  vessel]
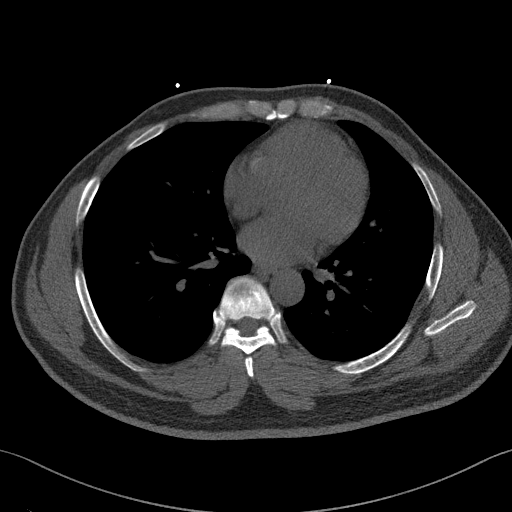
[im 45/68  vessel]
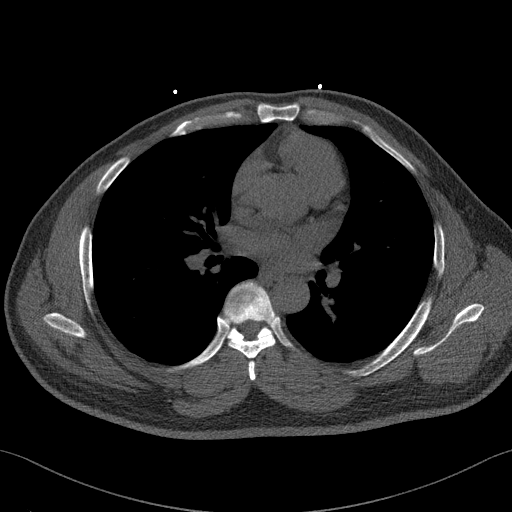
[im 56/68  vessel]
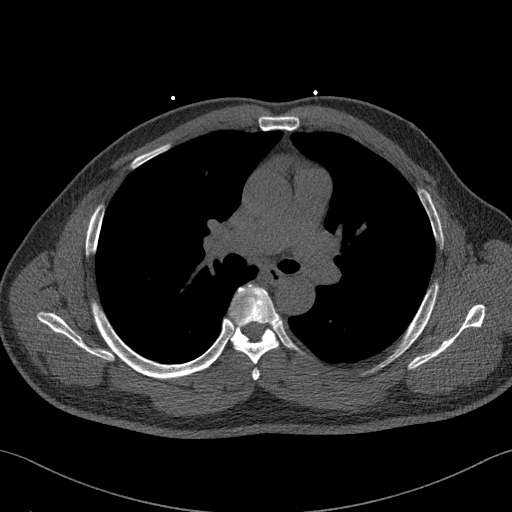
[im 56/68  lung]
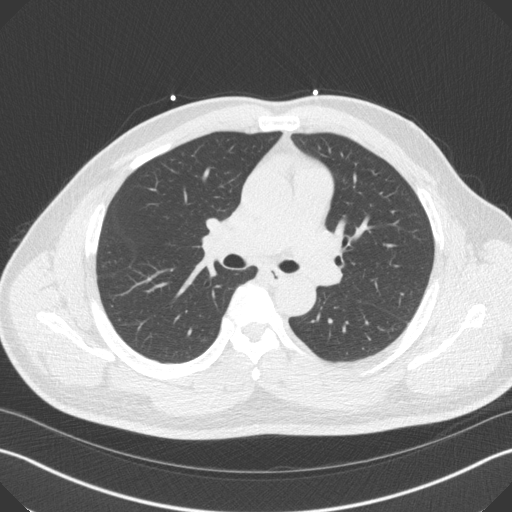

[Series 4: cascseq 2.0 br59 lung · axial · 0.73mm/px · z∈[-253,-165]mm · 5 of 68 slices shown]
[im 12/68  lung]
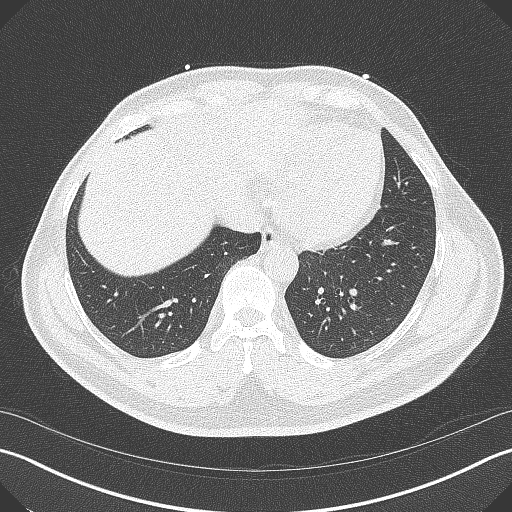
[im 23/68  lung]
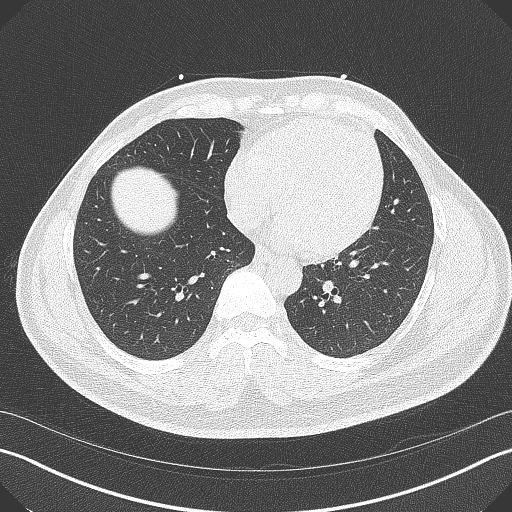
[im 34/68  lung]
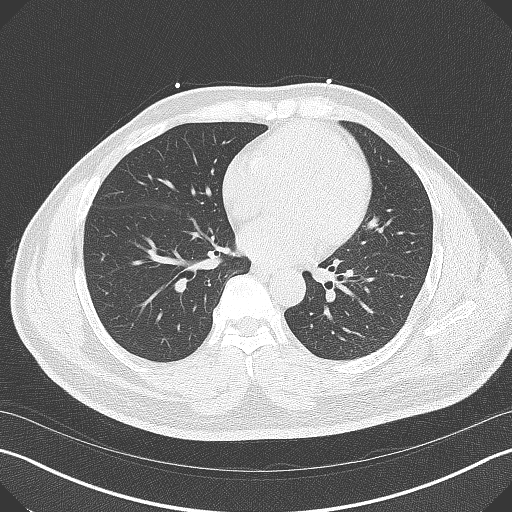
[im 45/68  lung]
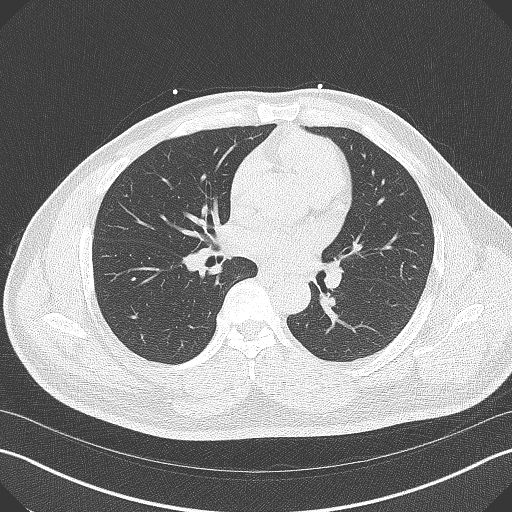
[im 56/68  lung]
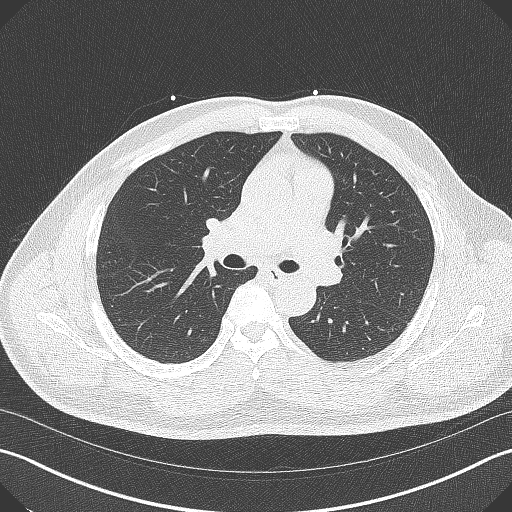

[14 of 20 positions shown; findings below may reference images not displayed]

FINDINGS: Within the visualized portions of the thorax there are no suspicious
appearing pulmonary nodules or masses, there is no acute
consolidative airspace disease, no pleural effusions, no
pneumothorax and no lymphadenopathy. Visualized portions of the
upper abdomen demonstrate a low-attenuation lesion in segment 4A of
the liver which is incompletely imaged and not characterized on
today's non-contrast CT examination, but statistically likely to
represent a cyst, measuring at least 2.5 x 2.0 cm. There are no
aggressive appearing lytic or blastic lesions noted in the
visualized portions of the skeleton.
IMPRESSION: 1. No significant incidental noncardiac findings are noted.
FINDINGS: Non-cardiac: See separate report from [REDACTED].

Ascending aorta: Normal diameter 3.3 cm

Pericardium: Normal

Coronary arteries: No calcium noted
IMPRESSION: Coronary calcium score of 0.

Yariell Nee

*** End of Addendum ***
EXAM:
OVER-READ INTERPRETATION  CT CHEST

The following report is an over-read performed by radiologist Dr.
Nayely Mahdavi [REDACTED] on 05/18/2021. This
over-read does not include interpretation of cardiac or coronary
anatomy or pathology. The coronary calcium score interpretation by
the cardiologist is attached.
FINDINGS: Within the visualized portions of the thorax there are no suspicious
appearing pulmonary nodules or masses, there is no acute
consolidative airspace disease, no pleural effusions, no
pneumothorax and no lymphadenopathy. Visualized portions of the
upper abdomen demonstrate a low-attenuation lesion in segment 4A of
the liver which is incompletely imaged and not characterized on
today's non-contrast CT examination, but statistically likely to
represent a cyst, measuring at least 2.5 x 2.0 cm. There are no
aggressive appearing lytic or blastic lesions noted in the
visualized portions of the skeleton.
IMPRESSION: 1. No significant incidental noncardiac findings are noted.

## 2024-01-03 NOTE — Progress Notes (Signed)
 Left voicemail for patient to return call.

## 2024-01-04 ENCOUNTER — Telehealth: Payer: Self-pay | Admitting: *Deleted

## 2024-01-04 NOTE — Telephone Encounter (Signed)
 Copied from CRM #[id number]. Topic: General - Call Back - No Documentation >> Jan 04, 2024  3:58 PM Alfonso ORN wrote: Reason for CRM: provided pt with note on lab results. Pt requesting call back on more information.

## 2024-01-05 NOTE — Telephone Encounter (Signed)
Reviewed lab results with pt- voiced understanding

## 2024-01-09 ENCOUNTER — Encounter: Payer: Self-pay | Admitting: Gastroenterology

## 2024-01-12 NOTE — Progress Notes (Signed)
 Jerry Crawford                                          MRN: [id number]   01/12/2024   The [facility] Quality Team Specialist reviewed this patient medical record for the purposes of chart review for care gap closure. The following were reviewed: chart review for care gap closure-kidney health evaluation for diabetes:eGFR  and uACR.    [facility] Quality Team

## 2024-01-15 ENCOUNTER — Encounter: Payer: Self-pay | Admitting: Dermatology

## 2024-01-15 ENCOUNTER — Ambulatory Visit: Admitting: Dermatology

## 2024-01-15 VITALS — BP 122/83

## 2024-01-15 DIAGNOSIS — L219 Seborrheic dermatitis, unspecified: Secondary | ICD-10-CM

## 2024-01-15 DIAGNOSIS — L308 Other specified dermatitis: Secondary | ICD-10-CM

## 2024-01-15 DIAGNOSIS — L309 Dermatitis, unspecified: Secondary | ICD-10-CM

## 2024-01-15 MED ORDER — CALCIPOTRIENE 0.005 % EX CREA: TOPICAL_CREAM | Freq: Two times a day (BID) | CUTANEOUS | 5 refills | Status: AC

## 2024-01-15 MED ORDER — CLOBETASOL PROPIONATE 0.05 % EX OINT: 1.0000 | TOPICAL_OINTMENT | Freq: Two times a day (BID) | CUTANEOUS | 5 refills | Status: AC

## 2024-01-15 NOTE — Progress Notes (Signed)
   Follow-Up Visit   Subjective  Jerry Crawford is a 63 y.o. male established patient who presents for FOLLOW UP on the diagnoses listed below:  Patient was last evaluated on 06/29/23.   Hand Dermatitis w/ PIH: Prescribed alternating clobetasol  & tacrolimus . However he stated he was unable to get tacrolimus . He stated he has been applying the clobetasol  when he remembers about 3x a week when its needed. Patient reports sxs are much better. Itch Score 0 out of 10.    The following portions of the chart were reviewed this encounter and updated as appropriate: medications, allergies, medical history  Review of Systems:  No other skin or systemic complaints except as noted in HPI or Assessment and Plan.  Objective  Well appearing patient in no apparent distress; mood and affect are within normal limits.   A focused examination was performed of the following areas: hands   Relevant exam findings are noted in the Assessment and Plan.         Assessment & Plan    Hand dermatitis Chronic hand dermatitis with hypopigmented areas due to frequent washing and sanitizing. Approximately 40% improvement since March with current treatment. Non-compliance with the prescribed regimen noted.  - Apply clobetasol  every night for three weeks, then take a break for three weeks. - Prescribe calcipotriene for nightly use during the three-week break from clobetasol . - Advise to keep medications on the nightstand to establish a routine. - Provide samples of [employer] [place] Hand Cream for daytime use. - Ensure clobetasol  and calcipotriene prescriptions have refills.  Seborrheic dermatitis  Seborrheic dermatitis of the scalp and ears, exacerbated by weather changes and sweating. Current use of Head and Shoulders is not sufficiently effective.  - Recommend DHS zinc shampoo, double the strength of Head and Shoulders. - Instruct to wash scalp, ears, and beard with DHS zinc shampoo, letting it sit  for two minutes before rinsing. - Advise against using oils directly on the scalp, but creams are acceptable. - Provide a printout with product recommendations and ensure access via MyChart.  HAND DERMATITIS   Related Medications calcipotriene (DOVONOX) 0.005 % cream Apply topically 2 (two) times daily. Alternate with steroid every 3 weeks. clobetasol  ointment (TEMOVATE ) 0.05 % Apply 1 Application topically 2 (two) times daily. Apply to hands 2 times a day for 3 weeks alternate with calcipottiene.  Return in about 6 months (around 07/15/2024) for hand derm.   Documentation: I have reviewed the above documentation for accuracy and completeness, and I agree with the above.  I, Shirron Maranda, CMA, am acting as scribe for [employer], DO.   Delon Lenis, DO

## 2024-01-15 NOTE — Patient Instructions (Addendum)
 VISIT SUMMARY:  You came in today for a follow-up on your hand dermatitis and seborrheic dermatitis. We discussed your current symptoms, treatment routines, and made some adjustments to your care plan to help improve your condition.  YOUR PLAN:  -HAND DERMATITIS:  Hand dermatitis is a chronic skin condition characterized by inflammation and irritation of the skin on your hands. It can be caused by frequent washing and sanitizing. You have shown about 40% improvement since March with your current treatment.  +To improve further, apply clobetasol  every night for three weeks, then take a break for three weeks.  +During the break, use calcipotriene nightly. Keep your medications on your nightstand to help establish a routine.  +Use [employer] [place] Hand Cream during the day. Your prescriptions for clobetasol  and calcipotriene have refills available.  -SEBORRHEIC DERMATITIS OF SCALP AND EARS:  Seborrheic dermatitis is a skin condition that causes itching and flaking, often exacerbated by weather changes and sweating. Your current use of Head and Shoulders shampoo is not sufficiently effective.  +Switch to [facility] zinc shampoo, which is stronger. Wash your scalp, ears, and beard with it, letting it sit for two minutes before rinsing. Avoid using oils directly on your scalp, but creams are acceptable. You will receive a printout with product recommendations and can also access this information via MyChart.  INSTRUCTIONS: Please follow the new treatment plans for your hand and seborrheic dermatitis. If you have any questions or concerns, feel free to reach out. Make sure to keep your follow-up appointments to monitor your progress.       Important Information  Due to recent changes in healthcare laws, you may see results of your pathology and/or laboratory studies on MyChart before the doctors have had a chance to review them. We understand that in some cases there may be results that are confusing  or concerning to you. Please understand that not all results are received at the same time and often the doctors may need to interpret multiple results in order to provide you with the best plan of care or course of treatment. Therefore, we ask that you please give us  2 business days to thoroughly review all your results before contacting the office for clarification. Should we see a critical lab result, you will be contacted sooner.   If You Need Anything After Your Visit  If you have any questions or concerns for your doctor, please call our main line at [phone] If no one answers, please leave a voicemail as directed and we will return your call as soon as possible. Messages left after 4 pm will be answered the following business day.   You may also send us  a message via MyChart. We typically respond to MyChart messages within 1-2 business days.  For prescription refills, please ask your pharmacy to contact our office. Our fax number is [phone].  If you have an urgent issue when the clinic is closed that cannot wait until the next business day, you can page your doctor at the number below.    Please note that while we do our best to be available for urgent issues outside of office hours, we are not available 24/7.   If you have an urgent issue and are unable to reach us , you may choose to seek medical care at your doctor's office, retail clinic, urgent care center, or emergency room.  If you have a medical emergency, please immediately call 911 or go to the emergency department. In the event of inclement weather,  please call our main line at [phone] for an update on the status of any delays or closures.  Dermatology Medication Tips: Please keep the boxes that topical medications come in in order to help keep track of the instructions about where and how to use these. Pharmacies typically print the medication instructions only on the boxes and not directly on the medication  tubes.   If your medication is too expensive, please contact our office at [phone] or send us  a message through MyChart.   We are unable to tell what your co-pay for medications will be in advance as this is different depending on your insurance coverage. However, we may be able to find a substitute medication at lower cost or fill out paperwork to get insurance to cover a needed medication.   If a prior authorization is required to get your medication covered by your insurance company, please allow us  1-2 business days to complete this process.  Drug prices often vary depending on where the prescription is filled and some pharmacies may offer cheaper prices.  The website www.goodrx.com contains coupons for medications through different pharmacies. The prices here do not account for what the cost may be with help from insurance (it may be cheaper with your insurance), but the website can give you the price if you did not use any insurance.  - You can print the associated coupon and take it with your prescription to the pharmacy.  - You may also stop by our office during regular business hours and pick up a GoodRx coupon card.  - If you need your prescription sent electronically to a different pharmacy, notify our office through [facility] or by phone at [phone]

## 2024-02-09 ENCOUNTER — Encounter: Admitting: Gastroenterology

## 2024-02-16 ENCOUNTER — Ambulatory Visit (AMBULATORY_SURGERY_CENTER): Admitting: Gastroenterology

## 2024-02-16 ENCOUNTER — Encounter: Payer: Self-pay | Admitting: Gastroenterology

## 2024-02-16 VITALS — BP 125/97 | HR 77 | Temp 98.1°F | Resp 20 | Ht 71.0 in | Wt 213.0 lb

## 2024-02-16 DIAGNOSIS — K2950 Unspecified chronic gastritis without bleeding: Secondary | ICD-10-CM | POA: Diagnosis not present

## 2024-02-16 DIAGNOSIS — K2970 Gastritis, unspecified, without bleeding: Secondary | ICD-10-CM

## 2024-02-16 DIAGNOSIS — K219 Gastro-esophageal reflux disease without esophagitis: Secondary | ICD-10-CM

## 2024-02-16 DIAGNOSIS — R1013 Epigastric pain: Secondary | ICD-10-CM

## 2024-02-16 MED ORDER — SODIUM CHLORIDE 0.9 % IV SOLN: 500.0000 mL | Freq: Once | INTRAVENOUS | Status: DC

## 2024-02-16 NOTE — Progress Notes (Signed)
 Report given to PACU, vss

## 2024-02-16 NOTE — Op Note (Signed)
 [facility] Patient Name: Jerry Crawford Procedure Date: 02/16/2024 10:26 AM MRN: [id number] Endoscopist: Sandor Flatter , MD, [id number] Age: 63 Referring MD:  Date of Birth: February 16, 1961 Gender: Male Account #: [id number] Procedure:                Upper GI endoscopy Indications:              Epigastric abdominal pain, Heartburn Medicines:                Monitored Anesthesia Care Procedure:                Pre-Anesthesia Assessment:                           - Prior to the procedure, a History and Physical                            was performed, and patient medications and                            allergies were reviewed. The patient's tolerance of                            previous anesthesia was also reviewed. The risks                            and benefits of the procedure and the sedation                            options and risks were discussed with the patient.                            All questions were answered, and informed consent                            was obtained. Prior Anticoagulants: The patient has                            taken no anticoagulant or antiplatelet agents. ASA                            Grade Assessment: III - A patient with severe                            systemic disease. After reviewing the risks and                            benefits, the patient was deemed in satisfactory                            condition to undergo the procedure.                           After obtaining informed consent, the endoscope was  passed under direct vision. Throughout the                            procedure, the patient's blood pressure, pulse, and                            oxygen saturations were monitored continuously. The                            Olympus Scope [id number] was introduced through the                            mouth, and advanced to the second part of duodenum.                            The  upper GI endoscopy was accomplished without                            difficulty. The patient tolerated the procedure                            well. Scope In: Scope Out: Findings:                 The examined esophagus was normal.                           The Z-line was regular and was found 42 cm from the                            incisors.                           Scattered minimal inflammation characterized by                            erythema was found in the gastric antrum. Biopsies                            were taken with a cold forceps for histology.                            Estimated blood loss was minimal.                           The gastric fundus, gastric body and incisura were                            normal. Additional biopsies were taken with a cold                            forceps for histology and Helicobacter pylori                            testing. Estimated blood loss was minimal.  The examined duodenum was normal. Complications:            No immediate complications. Estimated Blood Loss:     Estimated blood loss was minimal. Impression:               - Normal esophagus.                           - Z-line regular, 42 cm from the incisors.                           - Minimal, non-ulcer antral gastritis. Biopsied.                           - Normal gastric fundus, gastric body and incisura.                            Biopsied.                           - Normal examined duodenum. Recommendation:           - Patient has a contact number available for                            emergencies. The signs and symptoms of potential                            delayed complications were discussed with the                            patient. Return to normal activities tomorrow.                            Written discharge instructions were provided to the                            patient.                           - Resume previous  diet.                           - Continue present medications.                           - Await pathology results.                           - Return to GI clinic PRN. Sandor Flatter, MD 02/16/2024 10:45:18 AM

## 2024-02-16 NOTE — Progress Notes (Signed)
 Pt's states no medical or surgical changes since previsit or office visit.

## 2024-02-16 NOTE — Progress Notes (Signed)
 Called to room to assist during endoscopic procedure.  Patient ID and intended procedure confirmed with present staff. Received instructions for my participation in the procedure from the performing physician.

## 2024-02-16 NOTE — Progress Notes (Signed)
 GASTROENTEROLOGY PROCEDURE H&P NOTE   Primary Care Physician: Johnny Garnette LABOR, MD    Reason for Procedure:  Upper abdominal pain, GERD  Plan:    EGD  Patient is appropriate for endoscopic procedure(s) in the ambulatory ([facility]) setting.  The nature of the procedure, as well as the risks, benefits, and alternatives were carefully and thoroughly reviewed with the patient. Ample time for discussion and questions allowed. The patient understood, was satisfied, and agreed to proceed. I personally addressed all patient questions and concerns.     HPI: FINDLAY DAGHER is a 63 y.o. male who presents for EGD for evaluation of epigastric/upper abdominal pain along with history of GERD.   currently only uses OTC Pepcid  on demand for heartburn.  Past Medical History:  Diagnosis Date   Allergy    Hypercholesteremia    Hyperlipidemia    Hypertension    Peripheral vascular disease     Past Surgical History:  Procedure Laterality Date   COLONOSCOPY  11/28/2022   per Dr. Aneita, no polyps, repeat in 10 yrs   ENDOVENOUS ABLATION SAPHENOUS VEIN W/ LASER Left 11/08/2016   endovenous laser ablation left small saphenous vein by Lynwood Collum MD    ENDOVENOUS ABLATION SAPHENOUS VEIN W/ LASER Left 11/23/2016   endovenous laser ablation left greater saphenous vein and stab phlebectomy > 20 incisions left leg by Lynwood Collum MD   ENDOVENOUS ABLATION SAPHENOUS VEIN W/ LASER Right 12/26/2016   endovenous laser ablation R greater saphenous vein and stab phlebectomy > 20 incisions R leg by Lynwood Collum MD    ENDOVENOUS ABLATION SAPHENOUS VEIN W/ LASER Right 2024   per [facility]    Prior to Admission medications   Medication Sig Start Date End Date Taking? Authorizing Provider  metFORMIN  (GLUCOPHAGE ) 500 MG tablet Take 1 tablet (500 mg total) by mouth 2 (two) times daily with a meal. 09/26/23  Yes Johnny Garnette LABOR, MD  metoprolol  succinate (TOPROL -XL) 100 MG 24 hr tablet Take 1 tablet (100  mg total) by mouth daily. Take with or immediately following a meal. 09/18/23  Yes Johnny Garnette LABOR, MD  simvastatin  (ZOCOR ) 20 MG tablet Take 1 tablet (20 mg total) by mouth every evening. 07/19/23  Yes Johnny Garnette LABOR, MD  triamterene -hydrochlorothiazide (MAXZIDE-25) 37.5-25 MG tablet TAKE 1 TABLET BY MOUTH DAILY 10/20/23  Yes Johnny Garnette LABOR, MD  azelastine  (OPTIVAR ) 0.05 % ophthalmic solution INSTILL 1 DROP IN BOTH EYES TWICE DAILY 07/17/23   Johnny Garnette LABOR, MD  calcipotriene (DOVONOX) 0.005 % cream Apply topically 2 (two) times daily. Alternate with steroid every 3 weeks. 01/15/24   Alm Delon SAILOR, DO  clobetasol  ointment (TEMOVATE ) 0.05 % Apply 1 Application topically 2 (two) times daily. Apply to hands 2 times a day for 3 weeks alternate with calcipottiene. 01/15/24   Alm Delon SAILOR, DO  desonide  (DESOWEN ) 0.05 % lotion Apply topically 2 (two) times daily. 03/13/17   Johnny Garnette LABOR, MD  ipratropium (ATROVENT ) 0.03 % nasal spray Place 2 sprays into both nostrils every 12 (twelve) hours. 07/19/23   Johnny Garnette LABOR, MD  Multiple Vitamin (MULTIVITAMIN WITH MINERALS) TABS Take 1 tablet by mouth daily.    [provider]  ondansetron  (ZOFRAN -ODT) 4 MG disintegrating tablet Take 1 tablet (4 mg total) by mouth every 8 (eight) hours as needed for nausea or vomiting. Patient not taking: Reported on 02/16/2024 04/15/22   Norleen Lynwood ORN, MD  polyethylene glycol powder [facility]) 17 GM/SCOOP powder Take 17 g by  mouth as needed.    [provider]  tacrolimus  (PROTOPIC ) 0.1 % ointment Apply to hands twice daily for 3 weeks alternate with clobetasol  as needed every 3 weeks. 06/29/23   Alm Delon SAILOR, DO  Tetrahydrozoline HCl (EYE DROPS OP) Apply 2 drops to eye as needed (for redness in eyes).    [provider]  triamcinolone  cream (KENALOG ) 0.1 % Apply 1 application topically 2 (two) times daily. 06/08/18   Johnny Garnette LABOR, MD    Current Outpatient Medications  Medication Sig Dispense  Refill   metFORMIN  (GLUCOPHAGE ) 500 MG tablet Take 1 tablet (500 mg total) by mouth 2 (two) times daily with a meal. 180 tablet 3   metoprolol  succinate (TOPROL -XL) 100 MG 24 hr tablet Take 1 tablet (100 mg total) by mouth daily. Take with or immediately following a meal. 30 tablet 11   simvastatin  (ZOCOR ) 20 MG tablet Take 1 tablet (20 mg total) by mouth every evening. 90 tablet 3   triamterene -hydrochlorothiazide (MAXZIDE-25) 37.5-25 MG tablet TAKE 1 TABLET BY MOUTH DAILY 90 tablet 3   azelastine  (OPTIVAR ) 0.05 % ophthalmic solution INSTILL 1 DROP IN BOTH EYES TWICE DAILY 6 mL 11   calcipotriene (DOVONOX) 0.005 % cream Apply topically 2 (two) times daily. Alternate with steroid every 3 weeks. 60 g 5   clobetasol  ointment (TEMOVATE ) 0.05 % Apply 1 Application topically 2 (two) times daily. Apply to hands 2 times a day for 3 weeks alternate with calcipottiene. 60 g 5   desonide  (DESOWEN ) 0.05 % lotion Apply topically 2 (two) times daily. 59 mL 11   ipratropium (ATROVENT ) 0.03 % nasal spray Place 2 sprays into both nostrils every 12 (twelve) hours. 30 mL 12   Multiple Vitamin (MULTIVITAMIN WITH MINERALS) TABS Take 1 tablet by mouth daily.     ondansetron  (ZOFRAN -ODT) 4 MG disintegrating tablet Take 1 tablet (4 mg total) by mouth every 8 (eight) hours as needed for nausea or vomiting. (Patient not taking: Reported on 02/16/2024) 30 tablet 0   polyethylene glycol powder (GLYCOLAX/MIRALAX) 17 GM/SCOOP powder Take 17 g by mouth as needed.     tacrolimus  (PROTOPIC ) 0.1 % ointment Apply to hands twice daily for 3 weeks alternate with clobetasol  as needed every 3 weeks. 60 g 0   Tetrahydrozoline HCl (EYE DROPS OP) Apply 2 drops to eye as needed (for redness in eyes).     triamcinolone  cream (KENALOG ) 0.1 % Apply 1 application topically 2 (two) times daily. 45 g 2   Current Facility-Administered Medications  Medication Dose Route Frequency Provider Last Rate Last Admin   0.9 %  sodium chloride  infusion  500  mL Intravenous Once Aneita Gist T, MD       0.9 %  sodium chloride  infusion  500 mL Intravenous Once Marena Witts V, DO        Allergies as of 02/16/2024   (No Known Allergies)    Family History  Problem Relation Age of Onset   Asthma Mother    Hypertension Mother    Healthy Father    Asthma Sister    Hypertension Sister    Asthma Brother    Colon cancer Neg Hx    Colon polyps Neg Hx    Rectal cancer Neg Hx    Stomach cancer Neg Hx    Esophageal cancer Neg Hx     Social History   Socioeconomic History   Marital status: Single    Spouse name: Not on file   Number of children: 4  Years of education: 77   Highest education level: Not on file  Occupational History   Occupation: Location Manager  Tobacco Use   Smoking status: Never   Smokeless tobacco: Never  Vaping Use   Vaping status: Never Used  Substance and Sexual Activity   Alcohol use: Yes    Comment: Rarely <1/month   Drug use: No   Sexual activity: Not on file  Other Topics Concern   Not on file  Social History Narrative   ** Merged History Encounter **       Fun/Hobby: Dancing, basketball, baseball, going out every once in a while    Social Drivers of Corporate Investment Banker Strain: Not on file  Food Insecurity: Not on file  Transportation Needs: Not on file  Physical Activity: Not on file  Stress: Not on file  Social Connections: Not on file  Intimate Partner Violence: Not on file    Physical Exam: Vital signs in last 24 hours: @BP  (!) 164/104   Pulse 73   Temp 98.1 F (36.7 C) (Temporal)   Resp (!) 22   Ht 5' 11 (1.803 m)   Wt 213 lb (96.6 kg)   SpO2 99%   BMI 29.71 kg/m  GEN: NAD EYE: Sclerae anicteric ENT: MMM CV: Non-tachycardic Pulm: CTA b/l GI: Soft, NT/ND NEURO:  Alert & Oriented x 3   Sandor Flatter, DO [facility]   02/16/2024 10:23 AM

## 2024-02-16 NOTE — Patient Instructions (Signed)
 Resume previous diet  Continue present medications  Await pathology results  Return to GI clinic as needed  See handout on gastritis YOU HAD AN ENDOSCOPIC PROCEDURE TODAY AT THE [facility]:   Refer to the procedure report that was given to you for any specific questions about what was found during the examination.  If the procedure report does not answer your questions, please call your gastroenterologist to clarify.  If you requested that your care partner not be given the details of your procedure findings, then the procedure report has been included in a sealed envelope for you to review at your convenience later.  YOU SHOULD EXPECT: Some feelings of bloating in the abdomen. Passage of more gas than usual.  Walking can help get rid of the air that was put into your GI tract during the procedure and reduce the bloating. If you had a lower endoscopy (such as a colonoscopy or flexible sigmoidoscopy) you may notice spotting of blood in your stool or on the toilet paper. If you underwent a bowel prep for your procedure, you may not have a normal bowel movement for a few days.  Please Note:  You might notice some irritation and congestion in your nose or some drainage.  This is from the oxygen used during your procedure.  There is no need for concern and it should clear up in a day or so.  SYMPTOMS TO REPORT IMMEDIATELY: Following upper endoscopy (EGD)  Vomiting of blood or coffee ground material  New chest pain or pain under the shoulder blades  Painful or persistently difficult swallowing  New shortness of breath  Fever of 100F or higher  Black, tarry-looking stools For urgent or emergent issues, a gastroenterologist can be reached at any hour by calling [phone]. Do not use MyChart messaging for urgent concerns.   DIET:  We do recommend a small meal at first, but then you may proceed to your regular diet.  Drink plenty of fluids but you should avoid alcoholic beverages for  24 hours.  ACTIVITY:  You should plan to take it easy for the rest of today and you should NOT DRIVE or use heavy machinery until tomorrow (because of the sedation medicines used during the test).    FOLLOW UP: Our staff will call the number listed on your records the next business day following your procedure.  We will call around 7:15- 8:00 am to check on you and address any questions or concerns that you may have regarding the information given to you following your procedure. If we do not reach you, we will leave a message.     If any biopsies were taken you will be contacted by phone or by letter within the next 1-3 weeks.  Please call us  at [phone] if you have not heard about the biopsies in 3 weeks.   SIGNATURES/CONFIDENTIALITY: You and/or your care partner have signed paperwork which will be entered into your electronic medical record.  These signatures attest to the fact that that the information above on your After Visit Summary has been reviewed and is understood.  Full responsibility of the confidentiality of this discharge information lies with you and/or your care-partner.

## 2024-02-16 NOTE — Progress Notes (Signed)
1023 Robinul 0.1 mg IV given due large amount of secretions upon assessment.  MD made aware, vss 

## 2024-02-19 ENCOUNTER — Telehealth: Payer: Self-pay

## 2024-02-19 NOTE — Telephone Encounter (Signed)
 Attempted to reach patient for post-procedure f/u call. No answer. Left message for him to please not hesitate to call if he has questions/concerns regarding his care.

## 2024-02-19 NOTE — Telephone Encounter (Signed)
 Patient requesting another call. States he has been a little constipated. States he also has a bruise on his upper lip. Patient requesting call to discuss further. Please advise, thank you

## 2024-02-19 NOTE — Telephone Encounter (Signed)
 Returned pt's call. No answer. Left a detailed message stating that the bruise to his lip was likely caused by the the bite block used for the EGD and as long as he is not having any difficulty eating, swallowing or breathing, then there is no concern and that the area should heal soon. As for the constipation recommended pt use the as needed Miralax that is on his med list. Recommended 1 cap full daily or if needed he could increase to 2 cap fulls daily. Also instructed that he could try other over the counter laxatives if needed. Educated that his constipation is likely not related to the EGD. Instructed pt to call back if any further questions.

## 2024-02-20 LAB — SURGICAL PATHOLOGY

## 2024-02-23 ENCOUNTER — Ambulatory Visit: Payer: Self-pay | Admitting: Gastroenterology

## 2024-02-23 ENCOUNTER — Telehealth: Payer: Self-pay | Admitting: Gastroenterology

## 2024-02-23 NOTE — Telephone Encounter (Signed)
Patient calling in regards to results. Please advise.

## 2024-03-25 ENCOUNTER — Encounter: Payer: Self-pay | Admitting: Family Medicine

## 2024-03-25 ENCOUNTER — Ambulatory Visit: Admitting: Family Medicine

## 2024-03-25 VITALS — BP 136/68 | HR 64 | Temp 99.2°F | Ht 71.0 in | Wt 215.0 lb

## 2024-03-25 DIAGNOSIS — E1169 Type 2 diabetes mellitus with other specified complication: Secondary | ICD-10-CM

## 2024-03-25 DIAGNOSIS — Z7984 Long term (current) use of oral hypoglycemic drugs: Secondary | ICD-10-CM

## 2024-03-25 LAB — POCT GLYCOSYLATED HEMOGLOBIN (HGB A1C): HbA1c, POC (controlled diabetic range): 6.5 % (ref 0.0–7.0)

## 2024-03-25 NOTE — Progress Notes (Signed)
° °  Subjective:    Patient ID: Jerry Crawford, male    DOB: Aug 24, 1960, 63 y.o.   MRN: [id number]  HPI Here to follow up on type 2 diabetes. He feels fine. His last A1c in September was 7.0%. This is 6.5% today.    Review of Systems  Constitutional: Negative.   Respiratory: Negative.    Cardiovascular: Negative.        Objective:   Physical Exam Constitutional:      Appearance: Normal appearance.  Cardiovascular:     Rate and Rhythm: Normal rate.     Pulses: Normal pulses.     Heart sounds: Normal heart sounds.  Pulmonary:     Effort: Pulmonary effort is normal.  Neurological:     Mental Status: He is alert.           Assessment & Plan:  Type 2 diabetes, under good control. Stay on the current regimen. Garnette Olmsted, MD

## 2024-04-15 ENCOUNTER — Ambulatory Visit: Attending: Surgery | Admitting: Surgery

## 2024-04-15 ENCOUNTER — Encounter: Payer: Self-pay | Admitting: Surgery

## 2024-04-15 VITALS — BP 150/92 | HR 71 | Temp 97.9°F | Ht 71.0 in | Wt 220.0 lb

## 2024-04-15 DIAGNOSIS — I83891 Varicose veins of right lower extremities with other complications: Secondary | ICD-10-CM | POA: Diagnosis not present

## 2024-04-15 NOTE — Progress Notes (Signed)
 "                                    [facility]  Patient name: Jerry Crawford MRN: [id number] DOB: [birth year] Sex: male   REASON FOR VISIT:    Follow up  HISOTRY OF PRESENT ILLNESS:    Jerry Crawford is a 64 y.o. male who is status post laser ablation of bilateral greater saphenous veins as well as stab phlebectomies of the left leg.  The right saphenous vein has recanalized.  He was seen by Dr. Harvey several years ago and he was considering repeat intervention.  This never occurred.  He has also been evaluated at [facility] but was not covered by insurance.  He continues to have pain and swelling with prolonged standing which he has to do every day at work.  He does wear his compression stockings which do seem to help   PAST MEDICAL HISTORY:   Past Medical History:  Diagnosis Date   Allergy    Hypercholesteremia    Hyperlipidemia    Hypertension    Peripheral vascular disease      FAMILY HISTORY:   Family History  Problem Relation Age of Onset   Asthma Mother    Hypertension Mother    Healthy Father    Asthma Sister    Hypertension Sister    Asthma Brother    Colon cancer Neg Hx    Colon polyps Neg Hx    Rectal cancer Neg Hx    Stomach cancer Neg Hx    Esophageal cancer Neg Hx     SOCIAL HISTORY:   Social History   Tobacco Use   Smoking status: Never   Smokeless tobacco: Never  Substance Use Topics   Alcohol use: Yes    Comment: Rarely <1/month     ALLERGIES:   Allergies[1]   CURRENT MEDICATIONS:   Current Outpatient Medications  Medication Sig Dispense Refill   azelastine  (OPTIVAR ) 0.05 % ophthalmic solution INSTILL 1 DROP IN BOTH EYES TWICE DAILY 6 mL 11   calcipotriene  (DOVONOX) 0.005 % cream Apply topically 2 (two) times daily. Alternate with steroid every 3 weeks. 60 g 5   clobetasol  ointment (TEMOVATE ) 0.05 % Apply 1 Application topically 2 (two) times daily. Apply to hands 2 times a day for 3  weeks alternate with calcipottiene. 60 g 5   desonide  (DESOWEN ) 0.05 % lotion Apply topically 2 (two) times daily. 59 mL 11   ipratropium (ATROVENT ) 0.03 % nasal spray Place 2 sprays into both nostrils every 12 (twelve) hours. 30 mL 12   metFORMIN  (GLUCOPHAGE ) 500 MG tablet Take 1 tablet (500 mg total) by mouth 2 (two) times daily with a meal. 180 tablet 3   metoprolol  succinate (TOPROL -XL) 100 MG 24 hr tablet Take 1 tablet (100 mg total) by mouth daily. Take with or immediately following a meal. 30 tablet 11   Multiple Vitamin (MULTIVITAMIN WITH MINERALS) TABS Take 1 tablet by mouth daily.     ondansetron  (ZOFRAN -ODT) 4 MG disintegrating tablet Take 1 tablet (4 mg total) by mouth every 8 (eight) hours as needed for nausea or vomiting. 30 tablet 0   polyethylene glycol powder (GLYCOLAX/MIRALAX) 17 GM/SCOOP powder Take 17 g by mouth as needed.     simvastatin  (ZOCOR ) 20 MG tablet Take 1 tablet (20 mg total) by mouth every evening. 90 tablet 3   tacrolimus  (  PROTOPIC ) 0.1 % ointment Apply to hands twice daily for 3 weeks alternate with clobetasol  as needed every 3 weeks. 60 g 0   Tetrahydrozoline HCl (EYE DROPS OP) Apply 2 drops to eye as needed (for redness in eyes).     triamcinolone  cream (KENALOG ) 0.1 % Apply 1 application topically 2 (two) times daily. 45 g 2   triamterene -hydrochlorothiazide (MAXZIDE-25) 37.5-25 MG tablet TAKE 1 TABLET BY MOUTH DAILY 90 tablet 3   No current facility-administered medications for this visit.    REVIEW OF SYSTEMS:   [X]  denotes positive finding, [ ]  denotes negative finding Cardiac  Comments:  Chest pain or chest pressure:    Shortness of breath upon exertion:    Short of breath when lying flat:    Irregular heart rhythm:        Vascular    Pain in calf, thigh, or hip brought on by ambulation:    Pain in feet at night that wakes you up from your sleep:     Blood clot in your veins:    Leg swelling:  x       Pulmonary    Oxygen at home:     Productive cough:     Wheezing:         Neurologic    Sudden weakness in arms or legs:     Sudden numbness in arms or legs:     Sudden onset of difficulty speaking or slurred speech:    Temporary loss of vision in one eye:     Problems with dizziness:         Gastrointestinal    Blood in stool:     Vomited blood:         Genitourinary    Burning when urinating:     Blood in urine:        Psychiatric    Major depression:         Hematologic    Bleeding problems:    Problems with blood clotting too easily:        Skin    Rashes or ulcers:        Constitutional    Fever or chills:      PHYSICAL EXAM:   There were no vitals filed for this visit.  GENERAL: The patient is a well-nourished male, in no acute distress. The vital signs are documented above. CARDIAC: There is a regular rate and rhythm.  VASCULAR: SonoSite was used to evaluate the saphenous vein which has recanalized beginning in the mid thigh.  He has numerous varicosities throughout the leg PULMONARY: Non-labored respirations MUSCULOSKELETAL: There are no major deformities or cyanosis. NEUROLOGIC: No focal weakness or paresthesias are detected. SKIN: There are no ulcers or rashes noted. PSYCHIATRIC: The patient has a normal affect.  STUDIES:   I have reviewed the following reflux study: Venous Reflux Times  +-------------------+---------+------+----------+------------+-------------  ----+  RIGHT             Reflux NoReflux  Reflux  Diameter cmsComments                                         Yes     Time                                   +-------------------+---------+------+----------+------------+-------------  ----+  CFV                         yes  >1 second                                 +-------------------+---------+------+----------+------------+-------------  ----+  Popliteal         no                                                        +-------------------+---------+------+----------+------------+-------------  ----+  GSV at SFJ         no                           .80                         +-------------------+---------+------+----------+------------+-------------  ----+  GSV prox thigh               yes                .69     prior                                                                        ablation/strippin                                                         g,  recanalized     +-------------------+---------+------+----------+------------+-------------  ----+  GSV mid thigh                yes                .57     prior                                                                        ablation/strippin                                                         g,  recanalized     +-------------------+---------+------+----------+------------+-------------  ----+  GSV dist thigh  NWV                 +-------------------+---------+------+----------+------------+-------------  ----+  GSV at knee                  yes   >500 ms      .35                         +-------------------+---------+------+----------+------------+-------------  ----+  GSV prox calf      no                           .36                         +-------------------+---------+------+----------+------------+-------------  ----+  GSV mid calf                 yes   >500 ms      .27                         +-------------------+---------+------+----------+------------+-------------  ----+  GSV dist calf      no                           .26                         +-------------------+---------+------+----------+------------+-------------  ----+  [place]                   yes   >500 ms      .25                          +-------------------+---------+------+----------+------------+-------------  ----+  SSV prox calf      no                           .41                         +-------------------+---------+------+----------+------------+-------------  ----+  SSV mid calf       no                           .29                         +-------------------+---------+------+----------+------------+-------------  ----+  Mid Thigh                    yes   >500 ms      .66                         Varicosity                                                                  +-------------------+---------+------+----------+------------+-------------  ----+  Distal Calf                  yes   >  500 ms      .52                         Varicosity                                                                  +-------------------+---------+------+----------+------------+-------------  ----+       MEDICAL ISSUES:   CEAP class III, right leg: The patient has previously undergone laser ablation on the right leg which has recanalized.  He is now having issues with pain and swelling particularly after prolonged standing.  He does wear compression socks and keeps his leg elevated.  Sono site evaluation shows a recanalized vein at the mid thigh with numerous large varicosities.  I have recommended redo laser ablation and greater than 20 stabs    Malvina Serene CLORE, MD, FACS [facility] [phone] Pager [phone]      [1] No Known Allergies  "

## 2024-05-09 ENCOUNTER — Other Ambulatory Visit: Payer: Self-pay

## 2024-05-09 DIAGNOSIS — I83891 Varicose veins of right lower extremities with other complications: Secondary | ICD-10-CM

## 2024-05-09 DIAGNOSIS — I872 Venous insufficiency (chronic) (peripheral): Secondary | ICD-10-CM

## 2024-05-09 DIAGNOSIS — I83893 Varicose veins of bilateral lower extremities with other complications: Secondary | ICD-10-CM

## 2024-05-09 NOTE — Progress Notes (Signed)
 Venous reflux study ordered in error. Changed to post ablation venous reflux study.

## 2024-05-09 NOTE — Addendum Note (Signed)
 Addended by: CRISTELA MOM B on: 05/09/2024 12:29 PM   Modules accepted: Orders

## 2024-07-04 ENCOUNTER — Other Ambulatory Visit: Admitting: Surgery

## 2024-07-15 ENCOUNTER — Ambulatory Visit: Admitting: Dermatology

## 2024-07-18 ENCOUNTER — Ambulatory Visit (HOSPITAL_COMMUNITY)

## 2024-07-18 ENCOUNTER — Encounter: Admitting: Surgery
